# Patient Record
Sex: Male | Born: 1965 | Race: White | Hispanic: No | Smoking: Never smoker
Health system: Southern US, Community
[De-identification: ages and names within clinical notes are randomized; demographics above are authoritative.]

## PROBLEM LIST (undated history)

## (undated) DIAGNOSIS — F329 Major depressive disorder, single episode, unspecified: Secondary | ICD-10-CM

## (undated) DIAGNOSIS — E119 Type 2 diabetes mellitus without complications: Secondary | ICD-10-CM

## (undated) DIAGNOSIS — F32A Depression, unspecified: Secondary | ICD-10-CM

## (undated) DIAGNOSIS — F319 Bipolar disorder, unspecified: Secondary | ICD-10-CM

## (undated) DIAGNOSIS — H18601 Keratoconus, unspecified, right eye: Secondary | ICD-10-CM

## (undated) HISTORY — DX: Type 2 diabetes mellitus without complications: E11.9

## (undated) HISTORY — PX: EYE SURGERY: SHX253

---

## 2004-08-13 ENCOUNTER — Ambulatory Visit: Payer: Self-pay | Admitting: Family Medicine

## 2004-11-20 ENCOUNTER — Ambulatory Visit: Payer: Self-pay | Admitting: Family Medicine

## 2004-11-27 ENCOUNTER — Ambulatory Visit: Payer: Self-pay | Admitting: Family Medicine

## 2005-04-05 ENCOUNTER — Ambulatory Visit: Payer: Self-pay | Admitting: Cardiovascular Disease

## 2006-02-10 ENCOUNTER — Ambulatory Visit: Payer: Self-pay | Admitting: Family Medicine

## 2006-02-25 ENCOUNTER — Ambulatory Visit: Payer: Self-pay | Admitting: Family Medicine

## 2006-03-11 ENCOUNTER — Ambulatory Visit: Payer: Self-pay | Admitting: Family Medicine

## 2007-07-27 ENCOUNTER — Ambulatory Visit: Payer: Self-pay | Admitting: Internal Medicine

## 2008-01-12 ENCOUNTER — Ambulatory Visit: Payer: Self-pay | Admitting: Family Medicine

## 2008-01-12 DIAGNOSIS — E785 Hyperlipidemia, unspecified: Secondary | ICD-10-CM | POA: Insufficient documentation

## 2008-01-13 LAB — CONVERTED CEMR LAB
Cholesterol: 223 mg/dL (ref 0–200)
HDL: 37.7 mg/dL — ABNORMAL LOW (ref >39.0)
Total CHOL/HDL Ratio: 5.9
Triglycerides: 61 mg/dL (ref 0–149)
VLDL: 12 mg/dL (ref 0–40)

## 2008-02-02 ENCOUNTER — Ambulatory Visit: Payer: Self-pay | Admitting: Family Medicine

## 2008-03-09 ENCOUNTER — Emergency Department: Payer: Self-pay | Admitting: Emergency Medicine

## 2013-01-19 ENCOUNTER — Emergency Department: Payer: Self-pay | Admitting: Emergency Medicine

## 2013-01-20 DIAGNOSIS — H18603 Keratoconus, unspecified, bilateral: Secondary | ICD-10-CM | POA: Insufficient documentation

## 2013-07-16 DIAGNOSIS — Z947 Corneal transplant status: Secondary | ICD-10-CM | POA: Insufficient documentation

## 2014-01-26 ENCOUNTER — Observation Stay: Payer: Self-pay | Admitting: Internal Medicine

## 2014-01-26 LAB — COMPREHENSIVE METABOLIC PANEL
ALK PHOS: 45 U/L
ALT: 28 U/L (ref 12–78)
ANION GAP: 8 (ref 7–16)
AST: 34 U/L (ref 15–37)
Albumin: 4.2 g/dL (ref 3.4–5.0)
BILIRUBIN TOTAL: 1.2 mg/dL — AB (ref 0.2–1.0)
BUN: 26 mg/dL — ABNORMAL HIGH (ref 7–18)
CALCIUM: 9.2 mg/dL (ref 8.5–10.1)
Chloride: 101 mmol/L (ref 98–107)
Co2: 27 mmol/L (ref 21–32)
Creatinine: 1.36 mg/dL — ABNORMAL HIGH (ref 0.60–1.30)
EGFR (African American): >60
EGFR (Non-African Amer.): >60
GLUCOSE: 121 mg/dL — AB (ref 65–99)
Osmolality: 278 (ref 275–301)
Potassium: 3.9 mmol/L (ref 3.5–5.1)
SODIUM: 136 mmol/L (ref 136–145)
TOTAL PROTEIN: 7.4 g/dL (ref 6.4–8.2)

## 2014-01-26 LAB — CBC
HCT: 45 % (ref 40.0–52.0)
HGB: 15 g/dL (ref 13.0–18.0)
MCH: 30.7 pg (ref 26.0–34.0)
MCHC: 33.3 g/dL (ref 32.0–36.0)
MCV: 92 fL (ref 80–100)
Platelet: 205 10*3/uL (ref 150–440)
RBC: 4.88 10*6/uL (ref 4.40–5.90)
RDW: 14 % (ref 11.5–14.5)
WBC: 11.5 10*3/uL — AB (ref 3.8–10.6)

## 2014-01-26 LAB — CK TOTAL AND CKMB (NOT AT ARMC)
CK, TOTAL: 361 U/L — AB
CK-MB: 5.8 ng/mL — ABNORMAL HIGH (ref 0.5–3.6)

## 2014-01-26 LAB — TROPONIN I: Troponin-I: <0.02 ng/mL

## 2014-01-27 DIAGNOSIS — R079 Chest pain, unspecified: Secondary | ICD-10-CM

## 2014-01-27 LAB — CBC WITH DIFFERENTIAL/PLATELET
BASOS ABS: 0.1 10*3/uL (ref 0.0–0.1)
Basophil %: 1.7 %
EOS ABS: 0.1 10*3/uL (ref 0.0–0.7)
Eosinophil %: 2.2 %
HCT: 41.9 % (ref 40.0–52.0)
HGB: 13.9 g/dL (ref 13.0–18.0)
Lymphocyte #: 1 10*3/uL (ref 1.0–3.6)
Lymphocyte %: 15.9 %
MCH: 31 pg (ref 26.0–34.0)
MCHC: 33.1 g/dL (ref 32.0–36.0)
MCV: 94 fL (ref 80–100)
MONOS PCT: 10.8 %
Monocyte #: 0.6 x10 3/mm (ref 0.2–1.0)
NEUTROS PCT: 69.4 %
Neutrophil #: 4.1 10*3/uL (ref 1.4–6.5)
Platelet: 195 10*3/uL (ref 150–440)
RBC: 4.48 10*6/uL (ref 4.40–5.90)
RDW: 14.4 % (ref 11.5–14.5)
WBC: 6 10*3/uL (ref 3.8–10.6)

## 2014-01-27 LAB — CK: CK, TOTAL: 315 U/L — AB

## 2014-01-27 LAB — BASIC METABOLIC PANEL
ANION GAP: 8 (ref 7–16)
BUN: 24 mg/dL — AB (ref 7–18)
CALCIUM: 8.1 mg/dL — AB (ref 8.5–10.1)
CO2: 28 mmol/L (ref 21–32)
CREATININE: 1.15 mg/dL (ref 0.60–1.30)
Chloride: 104 mmol/L (ref 98–107)
EGFR (Non-African Amer.): >60
Glucose: 104 mg/dL — ABNORMAL HIGH (ref 65–99)
Osmolality: 284 (ref 275–301)
POTASSIUM: 3.9 mmol/L (ref 3.5–5.1)
Sodium: 140 mmol/L (ref 136–145)

## 2014-01-27 LAB — LIPID PANEL
Cholesterol: 168 mg/dL (ref 0–200)
HDL Cholesterol: 45 mg/dL (ref 40–60)
Ldl Cholesterol, Calc: 111 mg/dL — ABNORMAL HIGH (ref 0–100)
TRIGLYCERIDES: 58 mg/dL (ref 0–200)
VLDL CHOLESTEROL, CALC: 12 mg/dL (ref 5–40)

## 2014-01-27 LAB — TROPONIN I

## 2014-01-27 LAB — HEMOGLOBIN A1C: Hemoglobin A1C: 6 %

## 2014-03-30 DIAGNOSIS — R350 Frequency of micturition: Secondary | ICD-10-CM | POA: Insufficient documentation

## 2014-03-30 DIAGNOSIS — N138 Other obstructive and reflux uropathy: Secondary | ICD-10-CM | POA: Insufficient documentation

## 2014-03-30 DIAGNOSIS — R351 Nocturia: Secondary | ICD-10-CM | POA: Insufficient documentation

## 2014-03-30 DIAGNOSIS — N401 Enlarged prostate with lower urinary tract symptoms: Secondary | ICD-10-CM | POA: Insufficient documentation

## 2014-03-30 DIAGNOSIS — R3911 Hesitancy of micturition: Secondary | ICD-10-CM | POA: Insufficient documentation

## 2014-12-31 NOTE — Discharge Summary (Signed)
PATIENT NAME:  Micheal Gay, [place] A MR#:  [id number] DATE OF BIRTH:  1965-12-13  DATE OF ADMISSION:  01/26/2014 DATE OF DISCHARGE:  01/27/2014  ADMITTING PHYSICIAN: Alford Highlandichard Wieting, MD  DISCHARGING PHYSICIAN: Enid Baasadhika Monalisa Bayless, MD  PRIMARY CARE PHYSICIAN: None.  CONSULTANTS IN HOSPITAL: None.  DISCHARGE DIAGNOSIS: Chest pain secondary to costochondritis and musculoskeletal in nature.  DISCHARGE HOME MEDICATIONS:   1.  Ibuprofen 400 mg p.o. 8 hours p.r.n. for pain. 2.  Tramadol 50 mg p.o. q. 8 hours p.r.n. for pain.   DISCHARGE DIET: Regular.   DISCHARGE ACTIVITY: As tolerated.   FOLLOWUP INSTRUCTIONS: PCP follow-up in 2 to 3 weeks.   DIAGNOSTIC DATA: Prior to discharge: WBC 6, hemoglobin 13.9, hematocrit 41.9, platelet count 195,000.   Sodium 140, potassium 3.9, chloride 104, bicarb 28, BUN 24, creatinine 1.15, glucose 104, calcium 8.1.   Myocardial scan, exercise Myoview, showing perfusion study with no significant ischemia with EF of 62%. Low risk scan. No abnormality noted.   LDL cholesterol 111, HDL 45, total cholesterol 130168, triglycerides 58. Troponins remain negative.   Chest x-ray with clear lung fields. No acute cardiopulmonary disease.   BRIEF HOSPITAL COURSE: Mr. Renetta ChalkLarry Raybuck is a 49 year old young male with no significant past medical history, admitted for chest pain.   Chest pain, likely musculoskeletal in nature and costochondritis. The patient had tenderness on chest wall palpation. He also splits wood and was lifting heavy stuff and then felt like the pain started. Because he has a strong family history of his father dying from heart disease in 5830s, he was admitted. Troponins were negative. He was monitored on telemetry. He did not have any further chest pain. He had a Myoview done, which was completely normal, and the patient is being discharged home on pain medications. His course has been otherwise uneventful in the hospital.   DISCHARGE CONDITION: Stable.    DISCHARGE DISPOSITION: Home.   TIME SPENT ON DISCHARGE: 40 minutes. ____________________________ Enid Baasadhika Novi Calia, MD rk:sb D: 01/27/2014 12:57:14 ET T: 01/27/2014 14:29:56 ET JOB#: [id number]  cc: Enid Baasadhika Corderius Saraceni, MD, <Dictator> Enid BaasADHIKA Ndidi Nesby MD ELECTRONICALLY SIGNED 02/03/2014 11:51

## 2014-12-31 NOTE — H&P (Signed)
PATIENT NAME:  Micheal Gay, Micheal Gay MR#:  [id number] DATE OF BIRTH:  02-Jan-1966  DATE OF ADMISSION:  01/26/2014  PRIMARY CARE PHYSICIAN: None.   CHIEF COMPLAINT: Chest pain.   HISTORY OF PRESENT ILLNESS: This is a 49 year old man with no medical history. He presents after riding his bicycle today. He developed chest pain in the center of his chest, 9/10 in intensity. Then he also developed some sharp pain in the right upper shoulder. He was gasping for air. He felt the pain until he was given a nitro. He felt lying down and relaxing helped his pain. No nausea or vomiting. No diaphoresis. Of note, his father died at age 41 of a massive heart attack. Hospitalist services were contacted for further evaluation.   PAST MEDICAL HISTORY: None.   PAST SURGICAL HISTORY: Eye surgery, corneal transplants, left foot surgery, circumcision.   ALLERGIES: No known drug allergies.   MEDICATIONS: None.   SOCIAL HISTORY: No smoking. No alcohol. No drug use. Works at [employer].   FAMILY HISTORY: Father died at age 36 of a massive heart attack. Mother living with diabetes and breast cancer, status post surgery and radiation treatment.   REVIEW OF SYSTEMS:   CONSTITUTIONAL: Positive for fatigue. No weight loss. No weight gain. No fever, chills, or sweats.  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  EARS, NOSE, MOUTH AND THROAT: No hearing loss. No sore throat. No difficulty swallowing.  CARDIOVASCULAR: Positive for chest pain. No palpitations.  RESPIRATORY: Positive for shortness of breath. No cough. No sputum. No hemoptysis. No wheeze.  GASTROINTESTINAL: Positive for abdominal discomfort. No nausea. No vomiting. No diarrhea. No constipation. No bright red blood per rectum. No melena.  GENITOURINARY: No burning on urination. No hematuria.  MUSCULOSKELETAL: Positive for aching pains.  INTEGUMENT: No rashes or eruptions.  NEUROLOGIC: No fainting or blackouts.  PSYCHIATRIC: No anxiety or depression.   ENDOCRINE: No thyroid problems.  HEMATOLOGIC AND LYMPHATIC: No anemia.   PHYSICAL EXAMINATION: VITAL SIGNS: Temperature 98.1, pulse 80, respirations 18, blood pressure 98/57, pulse ox 98% on room air.  GENERAL: No respiratory distress.  EYES: Conjunctivae and lids normal. Pupils equal, round, reactive to light. Extraocular muscles intact. No nystagmus.  EARS, NOSE, MOUTH AND THROAT: Tympanic membranes: No erythema. Nasal mucosa: No erythema. Throat: No erythema. No exudate seen. Lips and gums: No lesions.  NECK: No JVD. No bruits. No lymphadenopathy. No thyromegaly. No thyroid nodules palpated.  RESPIRATORY:  Lungs clear to auscultation. No use of accessory muscles to breathe. No rhonchi, rales, or wheeze heard.  CARDIOVASCULAR SYSTEM: S1, S2 normal. No gallops, rubs, or murmurs heard. Carotid upstroke 2+ bilaterally. No bruits. Dorsalis pedis pulses 2+ bilaterally. No edema of the lower extremities. All pulses equal throughout upper and lower extremities.  ABDOMEN: Soft. Slight tenderness throughout the entire abdomen also. No organomegaly/ splenomegaly. Normoactive bowel sounds. No masses felt.  CHEST WALL: Positive pain to palpation over the chest wall.  LYMPHATIC: No lymph nodes in the neck.  MUSCULOSKELETAL: No clubbing, edema, or cyanosis.  SKIN: No rashes or ulcers seen.  NEUROLOGIC: Cranial nerves II through XII grossly intact. Deep tendon reflexes 2+ bilateral lower extremities.  PSYCHIATRIC: The patient is oriented to person, place, and time.   LABORATORY AND RADIOLOGICAL DATA:  EKG: Normal sinus rhythm, 94 beats per minute, no acute ST-T wave changes. Chest x-ray: Mild hypoinflation, no evidence of cardiopulmonary disease. White blood cell count 11.5, H and H 15.0 and 45.0, platelet count of 205. CPK 361, glucose 121,  BUN 26, creatinine 1.36, sodium 136, potassium 3.9, chloride 101, CO2 of 27, calcium 9.2. Total bilirubin 1.2, alkaline phosphatase 45, ALT 28, AST 34, total protein  7.4, albumin 4.2. Troponin negative.   ASSESSMENT AND PLAN: 1.  Atypical chest pain. Could be musculoskeletal in nature. With the patient's family history and exertional chest pain, ER physician asked me to observe him overnight. I will order serial cardiac enzymes and get a stress test in the morning.   2.  Dehydration. The ER physician gave a liter of fluid wide open. I will give another liter.   3.  Impaired fasting glucose. Will watch sugars while he is here, and check a hemoglobin A1c in the morning.   Of note, the patient did not want to stay in the hospital, was thinking about signing out of the hospital. The ER physician convinced him to stay.   Time spent on admission: 50 minutes.    ____________________________ Herschell Dimesichard J. Renae GlossWieting, MD rjw:mr D: 01/26/2014 19:28:05 ET T: 01/26/2014 19:54:17 ET JOB#: [id number]  cc: Herschell Dimesichard J. Renae GlossWieting, MD, <Dictator> Salley ScarletICHARD J Saraiya Kozma MD ELECTRONICALLY SIGNED 01/27/2014 19:38

## 2015-04-27 DIAGNOSIS — K219 Gastro-esophageal reflux disease without esophagitis: Secondary | ICD-10-CM | POA: Insufficient documentation

## 2015-04-27 DIAGNOSIS — I10 Essential (primary) hypertension: Secondary | ICD-10-CM | POA: Insufficient documentation

## 2015-10-29 ENCOUNTER — Emergency Department
Admission: EM | Admit: 2015-10-29 | Discharge: 2015-10-29 | Disposition: A | Discharge status: Home / Self Care | Payer: BLUE CROSS/BLUE SHIELD | Attending: Emergency Medicine | Admitting: Emergency Medicine

## 2015-10-29 ENCOUNTER — Encounter: Payer: Self-pay | Admitting: Emergency Medicine

## 2015-10-29 DIAGNOSIS — H109 Unspecified conjunctivitis: Secondary | ICD-10-CM | POA: Diagnosis not present

## 2015-10-29 DIAGNOSIS — H168 Other keratitis: Secondary | ICD-10-CM

## 2015-10-29 DIAGNOSIS — H169 Unspecified keratitis: Secondary | ICD-10-CM | POA: Insufficient documentation

## 2015-10-29 DIAGNOSIS — H5711 Ocular pain, right eye: Secondary | ICD-10-CM | POA: Diagnosis present

## 2015-10-29 HISTORY — DX: Keratoconus, unspecified, right eye: H18.601

## 2015-10-29 HISTORY — DX: Major depressive disorder, single episode, unspecified: F32.9

## 2015-10-29 HISTORY — DX: Depression, unspecified: F32.A

## 2015-10-29 MED ORDER — TETRACAINE HCL 0.5 % OP SOLN
OPHTHALMIC | Status: AC
  Administered 2015-10-29: 1 [drp] via OPHTHALMIC
  Filled 2015-10-29: qty 2

## 2015-10-29 MED ORDER — FLUORESCEIN SODIUM 1 MG OP STRP
ORAL_STRIP | OPHTHALMIC | Status: AC
  Administered 2015-10-29: 1 via OPHTHALMIC
  Filled 2015-10-29: qty 1

## 2015-10-29 MED ORDER — FLUORESCEIN SODIUM 1 MG OP STRP
1.0000 | ORAL_STRIP | Freq: Once | OPHTHALMIC | Status: AC
  Administered 2015-10-29: 1 via OPHTHALMIC

## 2015-10-29 MED ORDER — TETRACAINE HCL 0.5 % OP SOLN
1.0000 [drp] | Freq: Once | OPHTHALMIC | Status: AC
  Administered 2015-10-29: 1 [drp] via OPHTHALMIC

## 2015-10-29 MED ORDER — IBUPROFEN 800 MG PO TABS
800.0000 mg | ORAL_TABLET | Freq: Once | ORAL | Status: AC
  Administered 2015-10-29: 800 mg via ORAL
  Filled 2015-10-29: qty 1

## 2015-10-29 MED ORDER — ONDANSETRON HCL 4 MG PO TABS: 4.0000 mg | ORAL_TABLET | Freq: Every day | ORAL | Status: DC | PRN

## 2015-10-29 MED ORDER — CIPROFLOXACIN HCL 0.3 % OP SOLN: 1.0000 [drp] | OPHTHALMIC | Status: AC

## 2015-10-29 MED ORDER — ONDANSETRON 4 MG PO TBDP
4.0000 mg | ORAL_TABLET | Freq: Once | ORAL | Status: AC
  Administered 2015-10-29: 4 mg via ORAL
  Filled 2015-10-29: qty 1

## 2015-10-29 MED ORDER — OXYCODONE-ACETAMINOPHEN 5-325 MG PO TABS: 1.0000 | ORAL_TABLET | Freq: Four times a day (QID) | ORAL | Status: DC | PRN

## 2015-10-29 MED ORDER — CIPROFLOXACIN HCL 0.3 % OP SOLN
2.0000 [drp] | OPHTHALMIC | Status: DC
  Administered 2015-10-29: 2 [drp] via OPHTHALMIC
  Filled 2015-10-29: qty 2.5

## 2015-10-29 NOTE — ED Notes (Signed)
Pt states a few days ago he had sudden onset burning in his R eye. Pt states he thinks he lost his R contact in his eye and has not been able to get it out. Pt presents with redness and drainage noted to R eye. Pt states he is having blurrier vision than normal at this time in his R eye. Pt states he has a hard time seeing without his contacts, did not put in L eye this morning.

## 2015-10-29 NOTE — ED Provider Notes (Signed)
CSN: [id number]     Arrival date & time 10/29/15  0911 History   First MD Initiated Contact with Patient 10/29/15 1107     Chief Complaint  Patient presents with  . Eye Pain     (Consider location/radiation/quality/duration/timing/severity/associated sxs/prior Treatment) HPI  50 year old male presents to emergency department for evaluation of right eye pain. I pain began 2-3 days ago but this morning became severe 10 out of 10. Patient states he left his contact in, has been unable to get it out. He has had redness throughout the right eye with a foreign body sensation and photophobia. He has not taken any medications for pain. His vision is blurred. Denies any rashes.   Past Medical History  Diagnosis Date  . Keratoconus of right eye   . Depression    Past Surgical History  Procedure Laterality Date  . Eye surgery     History reviewed. No pertinent family history. Social History  Substance Use Topics  . Smoking status: Never Smoker   . Smokeless tobacco: Former Neurosurgeon  . Alcohol Use: No    Review of Systems  Constitutional: Negative.  Negative for fever, chills, activity change and appetite change.  HENT: Negative for congestion, ear pain, mouth sores, rhinorrhea, sinus pressure, sore throat and trouble swallowing.   Eyes: Positive for photophobia, pain, discharge, redness and visual disturbance.  Respiratory: Negative for cough, chest tightness and shortness of breath.   Cardiovascular: Negative for chest pain and leg swelling.  Gastrointestinal: Negative for nausea, vomiting, abdominal pain, diarrhea and abdominal distention.  Genitourinary: Negative for dysuria and difficulty urinating.  Musculoskeletal: Negative for back pain, arthralgias and gait problem.  Skin: Negative for color change and rash.  Neurological: Negative for dizziness and headaches.  Hematological: Negative for adenopathy.  Psychiatric/Behavioral: Negative for behavioral problems and agitation.       Allergies  Other and Pollen extract  Home Medications   Prior to Admission medications   Medication Sig Start Date End Date Taking? Authorizing Provider  ciprofloxacin (CILOXAN) 0.3 % ophthalmic solution Place 1 drop into both eyes every 2 (two) hours. Administer 1 drop, every 2 hours, while awake, for 2 days. Then 1 drop, every 4 hours, while awake, for the next 5 days. 10/29/15 11/03/15  Evon Slack, PA-C  ondansetron (ZOFRAN) 4 MG tablet Take 1 tablet (4 mg total) by mouth daily as needed for nausea or vomiting. 10/29/15 10/28/16  Evon Slack, PA-C  oxyCODONE-acetaminophen (ROXICET) 5-325 MG tablet Take 1 tablet by mouth every 6 (six) hours as needed. 10/29/15 10/28/16  Evon Slack, PA-C   BP 136/77 mmHg  Pulse 72  Temp(Src) 98.2 F (36.8 C) (Oral)  Resp 18  Ht 5' 11.5" (1.816 m)  Wt 99.791 kg  BMI 30.26 kg/m2  SpO2 98% Physical Exam  Constitutional: He is oriented to person, place, and time. He appears well-developed and well-nourished.  HENT:  Head: Normocephalic and atraumatic.  Eyes: EOM are normal. Pupils are equal, round, and reactive to light. Lids are everted and swept, no foreign bodies found. Right eye exhibits discharge. Right eye exhibits no exudate and no hordeolum. No foreign body present in the right eye. Right conjunctiva is injected. Left conjunctiva is not injected. Right eye exhibits normal extraocular motion. Left eye exhibits normal extraocular motion.  Slit lamp exam:      The right eye shows fluorescein uptake (2-3 mm).  Right contact lens was removed by using a suction.  Neck: Normal range of motion. Neck  supple.  Cardiovascular: Normal rate and intact distal pulses.   Pulmonary/Chest: Effort normal and breath sounds normal. No respiratory distress.  Musculoskeletal: Normal range of motion. He exhibits no edema or tenderness.  Neurological: He is alert and oriented to person, place, and time.  Skin: Skin is warm and dry. No rash noted.   Psychiatric: He has a normal mood and affect. His behavior is normal. Judgment and thought content normal.    ED Course  Procedures (including critical care time) Contact lens removed by using a suction. Tetracaine and forcing stain applied and showing corneal opacity 2-3 mm in size.  Labs Review Labs Reviewed - No data to display  Imaging Review No results found. I have personally reviewed and evaluated these images and lab results as part of my medical decision-making.   EKG Interpretation None      MDM   Final diagnoses:  Bacterial keratitis  Conjunctivitis of right eye    50 year old male with right eye erythema, drainage, photophobia after retained contact lens for 3 days. Context lens was removed and forcing stain showed a small opacity. Patient thought to have keratitis. Ophthalmology was consult could and recommended he be placed on topical antibiotics and given follow-up tomorrow morning. Patient understands and needs to see the ophthalmologist first thing in the morning for recheck. He will use antibiotic eyedrops every 2 hours until seen. He is given a prescription for pain medications. Return to the ER for any worsening symptoms urgent changes in his health.    Evon Slack, PA-C 10/29/15 1233  Jennye Moccasin, MD 10/29/15 [phone]

## 2015-10-29 NOTE — Discharge Instructions (Signed)
How to Use Eye Drops and Eye Ointments HOW TO APPLY EYE DROPS Follow these steps when applying eye drops: 1. Wash your hands. 2. Tilt your head back. 3. Put a finger under your eye and use it to gently pull your lower lid downward. Keep that finger in place. 4. Using your other hand, hold the dropper between your thumb and index finger. 5. Position the dropper just over the edge of the lower lid. Hold it as close to your eye as you can without touching the dropper to your eye. 6. Steady your hand. One way to do this is to lean your index finger against your brow. 7. Look up. 8. Slowly and gently squeeze one drop of medicine into your eye. 9. Close your eye. 10. Place a finger between your lower eyelid and your nose. Press gently for 2 minutes. This increases the amount of time that the medicine is exposed to the eye. It also reduces side effects that can develop if the drop gets into the bloodstream through the nose. HOW TO APPLY EYE OINTMENTS Follow these steps when applying eye ointments: 1. Wash your hands. 2. Put a finger under your eye and use it to gently pull your lower lid downward. Keep that finger in place. 3. Using your other hand, place the tip of the tube between your thumb and index finger with the remaining fingers braced against your cheek or nose. 4. Hold the tube just over the edge of your lower lid without touching the tube to your lid or eyeball. 5. Look up. 6. Line the inner part of your lower lid with ointment. 7. Gently pull up on your upper lid and look down. This will force the ointment to spread over the surface of the eye. 8. Release the upper lid. 9. If you can, close your eyes for 1-2 minutes. Do not rub your eyes. If you applied the ointment correctly, your vision will be blurry for a few minutes. This is normal. ADDITIONAL INFORMATION  Make sure to use the eye drops or ointment as told by your health care provider.  If you have been told to use both eye  drops and an eye ointment, apply the eye drops first, then wait 3-4 minutes before you apply the ointment.  Try not to touch the tip of the dropper or tube to your eye. A dropper or tube that has touched the eye can become contaminated.   This information is not intended to replace advice given to you by your health care provider. Make sure you discuss any questions you have with your health care provider.   Document Released: 12/02/2000 Document Revised: 01/10/2015 Document Reviewed: 08/22/2014 Elsevier Interactive Patient Education 2016 [employer]   Please follow-up with Dr.Porfillio office tomorrow morning. His address and office telephone number is located on the discharge instructions. Please tell the office that you were seen in the emergency department today and that Dr. Melanie Crazier asked for you to come in today for repeat eye exam. Please apply eye drops to the right eye every 2 hours.

## 2016-09-22 ENCOUNTER — Inpatient Hospital Stay
Admission: EM | Admit: 2016-09-22 | Discharge: 2016-09-26 | DRG: 872 | Disposition: A | Discharge status: Home / Self Care | Payer: BLUE CROSS/BLUE SHIELD | Attending: Internal Medicine | Admitting: Internal Medicine

## 2016-09-22 ENCOUNTER — Encounter: Payer: Self-pay | Admitting: Emergency Medicine

## 2016-09-22 DIAGNOSIS — R4182 Altered mental status, unspecified: Secondary | ICD-10-CM | POA: Diagnosis present

## 2016-09-22 DIAGNOSIS — F32A Depression, unspecified: Secondary | ICD-10-CM | POA: Diagnosis present

## 2016-09-22 DIAGNOSIS — F319 Bipolar disorder, unspecified: Secondary | ICD-10-CM | POA: Diagnosis present

## 2016-09-22 DIAGNOSIS — N3000 Acute cystitis without hematuria: Secondary | ICD-10-CM | POA: Diagnosis present

## 2016-09-22 DIAGNOSIS — N390 Urinary tract infection, site not specified: Secondary | ICD-10-CM | POA: Diagnosis present

## 2016-09-22 DIAGNOSIS — Z87891 Personal history of nicotine dependence: Secondary | ICD-10-CM | POA: Diagnosis not present

## 2016-09-22 DIAGNOSIS — R51 Headache: Secondary | ICD-10-CM | POA: Diagnosis not present

## 2016-09-22 DIAGNOSIS — R509 Fever, unspecified: Secondary | ICD-10-CM

## 2016-09-22 DIAGNOSIS — Z79899 Other long term (current) drug therapy: Secondary | ICD-10-CM | POA: Diagnosis not present

## 2016-09-22 DIAGNOSIS — A419 Sepsis, unspecified organism: Principal | ICD-10-CM | POA: Diagnosis present

## 2016-09-22 DIAGNOSIS — N419 Inflammatory disease of prostate, unspecified: Secondary | ICD-10-CM | POA: Diagnosis present

## 2016-09-22 DIAGNOSIS — F329 Major depressive disorder, single episode, unspecified: Secondary | ICD-10-CM | POA: Diagnosis present

## 2016-09-22 DIAGNOSIS — K5900 Constipation, unspecified: Secondary | ICD-10-CM | POA: Diagnosis not present

## 2016-09-22 HISTORY — DX: Sepsis, unspecified organism: A41.9

## 2016-09-22 HISTORY — DX: Bipolar disorder, unspecified: F31.9

## 2016-09-22 LAB — COMPREHENSIVE METABOLIC PANEL
ALK PHOS: 52 U/L (ref 38–126)
ALT: 39 U/L (ref 17–63)
ANION GAP: 6 (ref 5–15)
AST: 46 U/L — ABNORMAL HIGH (ref 15–41)
Albumin: 4.1 g/dL (ref 3.5–5.0)
BILIRUBIN TOTAL: 0.9 mg/dL (ref 0.3–1.2)
BUN: 16 mg/dL (ref 6–20)
CALCIUM: 8.5 mg/dL — AB (ref 8.9–10.3)
CO2: 30 mmol/L (ref 22–32)
Chloride: 97 mmol/L — ABNORMAL LOW (ref 101–111)
Creatinine, Ser: 0.98 mg/dL (ref 0.61–1.24)
Glucose, Bld: 167 mg/dL — ABNORMAL HIGH (ref 65–99)
POTASSIUM: 3.7 mmol/L (ref 3.5–5.1)
Sodium: 133 mmol/L — ABNORMAL LOW (ref 135–145)
Total Protein: 7.1 g/dL (ref 6.5–8.1)

## 2016-09-22 LAB — URINALYSIS, ROUTINE W REFLEX MICROSCOPIC
BACTERIA UA: NONE SEEN
BILIRUBIN URINE: NEGATIVE
Glucose, UA: NEGATIVE mg/dL
KETONES UR: NEGATIVE mg/dL
NITRITE: NEGATIVE
PH: 5 (ref 5.0–8.0)
PROTEIN: 30 mg/dL — AB
Specific Gravity, Urine: 1.011 (ref 1.005–1.030)
Squamous Epithelial / LPF: NONE SEEN

## 2016-09-22 LAB — CBC WITH DIFFERENTIAL/PLATELET
BASOS PCT: 0 %
Basophils Absolute: 0 10*3/uL (ref 0–0.1)
Eosinophils Absolute: 0 10*3/uL (ref 0–0.7)
Eosinophils Relative: 0 %
HEMATOCRIT: 41 % (ref 40.0–52.0)
HEMOGLOBIN: 13.9 g/dL (ref 13.0–18.0)
LYMPHS PCT: 4 %
Lymphs Abs: 0.6 10*3/uL — ABNORMAL LOW (ref 1.0–3.6)
MCH: 31.1 pg (ref 26.0–34.0)
MCHC: 34 g/dL (ref 32.0–36.0)
MCV: 91.6 fL (ref 80.0–100.0)
MONO ABS: 1.4 10*3/uL — AB (ref 0.2–1.0)
Monocytes Relative: 10 %
NEUTROS ABS: 12.8 10*3/uL — AB (ref 1.4–6.5)
NEUTROS PCT: 86 %
Platelets: 179 10*3/uL (ref 150–440)
RBC: 4.47 MIL/uL (ref 4.40–5.90)
RDW: 13.7 % (ref 11.5–14.5)
WBC: 14.9 10*3/uL — AB (ref 3.8–10.6)

## 2016-09-22 LAB — INFLUENZA PANEL BY PCR (TYPE A & B)
INFLAPCR: NEGATIVE
Influenza B By PCR: NEGATIVE

## 2016-09-22 LAB — LACTIC ACID, PLASMA: Lactic Acid, Venous: 1.5 mmol/L (ref 0.5–1.9)

## 2016-09-22 MED ORDER — ACETAMINOPHEN 500 MG PO TABS
ORAL_TABLET | ORAL | Status: AC
  Administered 2016-09-22: 1000 mg via ORAL
  Filled 2016-09-22: qty 2

## 2016-09-22 MED ORDER — ESCITALOPRAM OXALATE 10 MG PO TABS
10.0000 mg | ORAL_TABLET | Freq: Every day | ORAL | Status: DC
  Administered 2016-09-23: 10 mg via ORAL
  Filled 2016-09-22 (×2): qty 1

## 2016-09-22 MED ORDER — DEXTROSE 5 % IV SOLN
1.0000 g | Freq: Once | INTRAVENOUS | Status: DC
  Filled 2016-09-22: qty 1

## 2016-09-22 MED ORDER — CEFEPIME-DEXTROSE 1 GM/50ML IV SOLR
1.0000 g | Freq: Once | INTRAVENOUS | Status: AC
  Administered 2016-09-22: 1 g via INTRAVENOUS
  Filled 2016-09-22: qty 50

## 2016-09-22 MED ORDER — SODIUM CHLORIDE 0.9 % IV BOLUS (SEPSIS)
1000.0000 mL | Freq: Once | INTRAVENOUS | Status: AC
  Administered 2016-09-22: 1000 mL via INTRAVENOUS

## 2016-09-22 MED ORDER — RISPERIDONE 0.5 MG PO TABS
0.5000 mg | ORAL_TABLET | Freq: Every day | ORAL | Status: DC
  Filled 2016-09-22: qty 1

## 2016-09-22 MED ORDER — ACETAMINOPHEN 500 MG PO TABS
1000.0000 mg | ORAL_TABLET | Freq: Once | ORAL | Status: AC
Start: 2016-09-22 — End: 2016-09-22
  Administered 2016-09-22: 1000 mg via ORAL

## 2016-09-22 MED ORDER — OXYCODONE-ACETAMINOPHEN 5-325 MG PO TABS
1.0000 | ORAL_TABLET | Freq: Four times a day (QID) | ORAL | Status: DC | PRN
  Administered 2016-09-23 – 2016-09-25 (×5): 1 via ORAL
  Filled 2016-09-22 (×5): qty 1

## 2016-09-22 MED ORDER — DEXTROSE 5 % IV SOLN
2.0000 g | Freq: Three times a day (TID) | INTRAVENOUS | Status: DC
  Filled 2016-09-22 (×2): qty 2

## 2016-09-22 MED ORDER — VANCOMYCIN HCL 10 G IV SOLR: 1250.0000 mg | Freq: Once | INTRAVENOUS | Status: AC

## 2016-09-22 MED ORDER — ONDANSETRON HCL 4 MG/2ML IJ SOLN: 4.0000 mg | Freq: Four times a day (QID) | INTRAMUSCULAR | Status: DC | PRN

## 2016-09-22 MED ORDER — VANCOMYCIN HCL 10 G IV SOLR
1250.0000 mg | Freq: Three times a day (TID) | INTRAVENOUS | Status: DC
  Administered 2016-09-23: 1250 mg via INTRAVENOUS
  Filled 2016-09-22 (×3): qty 1250

## 2016-09-22 MED ORDER — MORPHINE SULFATE (PF) 4 MG/ML IV SOLN
4.0000 mg | Freq: Once | INTRAVENOUS | Status: AC
  Administered 2016-09-22: 4 mg via INTRAVENOUS
  Filled 2016-09-22: qty 1

## 2016-09-22 MED ORDER — CEFEPIME-DEXTROSE 2 GM/50ML IV SOLR
2.0000 g | Freq: Two times a day (BID) | INTRAVENOUS | Status: DC
  Filled 2016-09-22: qty 50

## 2016-09-22 MED ORDER — ACETAMINOPHEN 650 MG RE SUPP: 650.0000 mg | Freq: Four times a day (QID) | RECTAL | Status: DC | PRN

## 2016-09-22 MED ORDER — CEFEPIME-DEXTROSE 2 GM/50ML IV SOLR
2.0000 g | Freq: Three times a day (TID) | INTRAVENOUS | Status: DC
  Administered 2016-09-23: 2 g via INTRAVENOUS
  Filled 2016-09-22 (×3): qty 50

## 2016-09-22 MED ORDER — ENOXAPARIN SODIUM 40 MG/0.4ML ~~LOC~~ SOLN
40.0000 mg | SUBCUTANEOUS | Status: DC
  Administered 2016-09-23 – 2016-09-25 (×4): 40 mg via SUBCUTANEOUS
  Filled 2016-09-22 (×4): qty 0.4

## 2016-09-22 MED ORDER — ONDANSETRON HCL 4 MG PO TABS: 4.0000 mg | ORAL_TABLET | Freq: Four times a day (QID) | ORAL | Status: DC | PRN

## 2016-09-22 MED ORDER — ACETAMINOPHEN 325 MG PO TABS
650.0000 mg | ORAL_TABLET | Freq: Four times a day (QID) | ORAL | Status: DC | PRN
  Administered 2016-09-23 – 2016-09-25 (×5): 650 mg via ORAL
  Filled 2016-09-22 (×5): qty 2

## 2016-09-22 MED ORDER — ONDANSETRON HCL 4 MG/2ML IJ SOLN
4.0000 mg | Freq: Once | INTRAMUSCULAR | Status: AC
  Administered 2016-09-22: 4 mg via INTRAVENOUS
  Filled 2016-09-22: qty 2

## 2016-09-22 NOTE — ED Triage Notes (Addendum)
Pt reports fever, body aches and dysuria; temp at home 102; currently 101.5-has not taken any tylenol or ibuprofen since this am; pt adds he's been very dizzy, feeling weak and confused; feels like he's having difficulty following directions;

## 2016-09-22 NOTE — H&P (Signed)
[facility] [place] [facility]   PATIENT NAME: Micheal ChalkLarry Gay    MR#:  [id number]  DATE OF BIRTH:  05-Sep-1966  DATE OF ADMISSION:  09/22/2016  PRIMARY CARE PHYSICIAN: Luna FuseEJAN-SIE, SHEIKH AHMED, MD   REQUESTING/REFERRING PHYSICIAN: Lenard LancePaduchowski, MD  CHIEF COMPLAINT:   Chief Complaint  Patient presents with  . Dysuria  . Generalized Body Aches  . Dizziness  . Altered Mental Status    HISTORY OF PRESENT ILLNESS:  Micheal Gay  is a 51 y.o. male who presents with An day of nausea, body aches, dysuria, and a fall. Patient states for some time is been having some urinary hesitancy and urinary frequency, as well as a sensation of incomplete voiding. He is also had some ejaculatory issues. Today in the ED was found to meet sepsis criteria and likely has UTI. Hospitalists were called for admission  PAST MEDICAL HISTORY:   Past Medical History:  Diagnosis Date  . Bipolar 1 disorder (HCC)   . Depression   . Keratoconus of right eye     PAST SURGICAL HISTORY:   Past Surgical History:  Procedure Laterality Date  . EYE SURGERY      SOCIAL HISTORY:   Social History  Substance Use Topics  . Smoking status: Never Smoker  . Smokeless tobacco: Former NeurosurgeonUser  . Alcohol use No    FAMILY HISTORY:   Family History  Problem Relation Age of Onset  . Diabetes Mother   . Breast cancer Mother   . Heart attack Father     DRUG ALLERGIES:   Allergies  Allergen Reactions  . Other     Animal Dandruff Hay    . Pollen Extract     MEDICATIONS AT HOME:   Prior to Admission medications   Medication Sig Start Date End Date Taking? Authorizing Provider  escitalopram (LEXAPRO) 10 MG tablet Take 10 mg by mouth daily. 08/26/16   Historical Provider, MD  ondansetron (ZOFRAN) 4 MG tablet Take 1 tablet (4 mg total) by mouth daily as needed for nausea or vomiting. 10/29/15 10/28/16  Evon Slackhomas C Gaines, PA-C  oxyCODONE-acetaminophen (ROXICET) 5-325 MG tablet Take 1  tablet by mouth every 6 (six) hours as needed. 10/29/15 10/28/16  Evon Slackhomas C Gaines, PA-C  risperiDONE (RISPERDAL) 0.5 MG tablet Take 0.5 mg by mouth daily. 09/17/16   Historical Provider, MD    REVIEW OF SYSTEMS:  Review of Systems  Constitutional: Positive for fever and malaise/fatigue. Negative for chills and weight loss.  HENT: Negative for ear pain, hearing loss and tinnitus.   Eyes: Negative for blurred vision, double vision, pain and redness.  Respiratory: Negative for cough, hemoptysis and shortness of breath.   Cardiovascular: Negative for chest pain, palpitations, orthopnea and leg swelling.  Gastrointestinal: Positive for nausea. Negative for abdominal pain, constipation, diarrhea and vomiting.  Genitourinary: Positive for dysuria and frequency. Negative for hematuria.  Musculoskeletal: Negative for back pain, joint pain and neck pain.  Skin:       No acne, rash, or lesions  Neurological: Negative for dizziness, tremors, focal weakness and weakness.  Endo/Heme/Allergies: Negative for polydipsia. Does not bruise/bleed easily.  Psychiatric/Behavioral: Negative for depression. The patient is not nervous/anxious and does not have insomnia.      VITAL SIGNS:   Vitals:   09/22/16 1927 09/22/16 2000 09/22/16 2030 09/22/16 2100  BP:  100/78 111/61 109/63  Pulse: 94 92 92 92  Resp: 20 (!) 21 18 20   Temp:      TempSrc:  SpO2: 98% 97% 96% 94%  Weight:      Height:       Wt Readings from Last 3 Encounters:  09/22/16 87.1 kg (192 lb)  10/29/15 99.8 kg (220 lb)  02/02/08 (!) 108 kg (238 lb)    PHYSICAL EXAMINATION:  Physical Exam  Vitals reviewed. Constitutional: He is oriented to person, place, and time. He appears well-developed and well-nourished. No distress.  HENT:  Head: Normocephalic and atraumatic.  Mouth/Throat: Oropharynx is clear and moist.  Eyes: Conjunctivae and EOM are normal. Pupils are equal, round, and reactive to light. No scleral icterus.  Neck: Normal  range of motion. Neck supple. No JVD present. No thyromegaly present.  Cardiovascular: Normal rate, regular rhythm and intact distal pulses.  Exam reveals no gallop and no friction rub.   No murmur heard. Respiratory: Effort normal and breath sounds normal. No respiratory distress. He has no wheezes. He has no rales.  GI: Soft. Bowel sounds are normal. He exhibits no distension. There is no tenderness.  Musculoskeletal: Normal range of motion. He exhibits no edema.  No arthritis, no gout  Lymphadenopathy:    He has no cervical adenopathy.  Neurological: He is alert and oriented to person, place, and time. No cranial nerve deficit.  No dysarthria, no aphasia  Skin: Skin is warm and dry. No rash noted. No erythema.  Psychiatric: He has a normal mood and affect. His behavior is normal. Judgment and thought content normal.    LABORATORY PANEL:   CBC  Recent Labs Lab 09/22/16 1929  WBC 14.9*  HGB 13.9  HCT 41.0  PLT 179   ------------------------------------------------------------------------------------------------------------------  Chemistries   Recent Labs Lab 09/22/16 1929  NA 133*  K 3.7  CL 97*  CO2 30  GLUCOSE 167*  BUN 16  CREATININE 0.98  CALCIUM 8.5*  AST 46*  ALT 39  ALKPHOS 52  BILITOT 0.9   ------------------------------------------------------------------------------------------------------------------  Cardiac Enzymes No results for input(s): TROPONINI in the last 168 hours. ------------------------------------------------------------------------------------------------------------------  RADIOLOGY:  No results found.  EKG:   Orders placed or performed during the hospital encounter of 09/22/16  . ED EKG 12-Lead  . ED EKG 12-Lead  . EKG 12-Lead  . EKG 12-Lead    IMPRESSION AND PLAN:  Principal Problem:   Sepsis (HCC) - IV antibiotics started in the ED and continued on admission, lactic acid was normal, hemodynamically stable, culture sent  from the ED Active Problems:   UTI (urinary tract infection) - causes sepsis, antibiotics and cultures as above, suspect patient may have some underlying prostate issue and likely needs a urology consult on discharge.   Depression - continue home meds   Bipolar 1 disorder (HCC) - continue home meds  All the records are reviewed and case discussed with ED provider. Management plans discussed with the patient and/or family.  DVT PROPHYLAXIS: SubQ lovenox  GI PROPHYLAXIS: None  ADMISSION STATUS: Inpatient  CODE STATUS: Full Code Status History    This patient does not have a recorded code status. Please follow your organizational policy for patients in this situation.      TOTAL TIME TAKING CARE OF THIS PATIENT: 45 minutes.    Decarlo Rivet FIELDING 09/22/2016, 9:12 PM  [facility]  Office  [phone]  CC: Primary care physician; Luna Fuse, MD

## 2016-09-22 NOTE — Progress Notes (Signed)
Pharmacy Antibiotic Note  Micheal SorrowLarry A Gay is a 51 y.o. male admitted on 09/22/2016 with  sepsis and UTI.  Pharmacy has been consulted for Cefepime dosing. Patient received Cefepime 1gm IV in ED x 1 dose.   Plan: Will start patient on Cefepime 2gm IV every 12 hours.  Height: 5' 11.5" (181.6 cm) Weight: 192 lb (87.1 kg) IBW/kg (Calculated) : 76.45  Temp (24hrs), Avg:101.5 F (38.6 C), Min:101.5 F (38.6 C), Max:101.5 F (38.6 C)   Recent Labs Lab 09/22/16 1929  WBC 14.9*  CREATININE 0.98  LATICACIDVEN 1.5    Estimated Creatinine Clearance: 98.7 mL/min (by C-G formula based on SCr of 0.98 mg/dL).    Allergies  Allergen Reactions  . Other     Animal Dandruff Hay    . Pollen Extract     Antimicrobials this admission: 0114 Cefepime >>   Dose adjustments this admission:   Microbiology results: 0114 BCx: sent 0114 UCx: sent   Thank you for allowing pharmacy to be a part of this patient's care.  Gardner CandleSheema M Wyatt Thorstenson, PharmD, BCPS Clinical Pharmacist 09/22/2016 9:42 PM

## 2016-09-22 NOTE — ED Provider Notes (Signed)
[facility] Emergency Department Provider Note  Time seen: 7:35 PM  I have reviewed the triage vital signs and the nursing notes.   HISTORY  Chief Complaint Dysuria; Generalized Body Aches; Dizziness; and Altered Mental Status    HPI Micheal Gay is a 51 y.o. male With a past medical history of bipolar presents the emergenc, nausea, fever to 101, body aches and generalized weakness. According to the patient for the past 2 days he has been febrile with fever and chills at home. States pain with urination and pain in the lower abdomen.Denies any history of UTIs in the past. Denies any cough congestion or sore throat.negative for diarrhea.  Past Medical History:  Diagnosis Date  . Bipolar 1 disorder (HCC)   . Depression   . Keratoconus of right eye     Patient Active Problem List   Diagnosis Date Noted  . HYPERLIPIDEMIA 01/12/2008    Past Surgical History:  Procedure Laterality Date  . EYE SURGERY      Prior to Admission medications   Medication Sig Start Date End Date Taking? Authorizing Provider  escitalopram (LEXAPRO) 10 MG tablet Take 10 mg by mouth daily. 08/26/16   Historical Provider, MD  ondansetron (ZOFRAN) 4 MG tablet Take 1 tablet (4 mg total) by mouth daily as needed for nausea or vomiting. 10/29/15 10/28/16  Evon Slackhomas C Gaines, PA-C  oxyCODONE-acetaminophen (ROXICET) 5-325 MG tablet Take 1 tablet by mouth every 6 (six) hours as needed. 10/29/15 10/28/16  Evon Slackhomas C Gaines, PA-C  risperiDONE (RISPERDAL) 0.5 MG tablet Take 0.5 mg by mouth daily. 09/17/16   Historical Provider, MD    Allergies  Allergen Reactions  . Other     Animal Dandruff Hay    . Pollen Extract     History reviewed. No pertinent family history.  Social History Social History  Substance Use Topics  . Smoking status: Never Smoker  . Smokeless tobacco: Former NeurosurgeonUser  . Alcohol use No    Review of Systems Constitutional: Negative for fever. Cardiovascular: Negative  for chest pain. Respiratory: Negative for shortness of breath. Gastrointestinal: positive for 9/10 dull suprapubic abdominal pain. Positive for nausea but negative for vomiting. Negat Genitourinary: moderate dysuria. Negative for hematuria. Musculoskeletal: Negative for back pain. Neurological: Negative for headache 10-point ROS otherwise negative.  ____________________________________________   PHYSICAL EXAM:  VITAL SIGNS: ED Triage Vitals [09/22/16 1914]  Enc Vitals Group     BP 136/65     Pulse Rate 96     Resp 18     Temp (!) 101.5 F (38.6 C)     Temp Source Oral     SpO2 100 %     Weight 192 lb (87.1 kg)     Height 5' 11.5" (1.816 m)     Head Circumference      Peak Flow      Pain Score 9     Pain Loc      Pain Edu?      Excl. in GC?     Constitutional: Alert and oriented. Well appearing and in no distress. Eyes: Normal exam ENT   Head: Normocephalic and atraumatic.   Mouth/Throat: Mucous membranes are moist.no pharyngeal erythema. Cardiovascular:egular rhythm, rate around 100 bpm. Respiratory: Normal respiratory effort without tachypnea nor retractions. Breath sounds are clear  Gastrointestinal: soft, moderate suprapubic tenderness to palpation, no rebound or guarding. No distention. Abse benign. Musculoskeletal: Nontender with normal range of motion in all extremities.  Neurologic:  Normal speech and language.  No gross focal neurologic deficits  Skin:  Skin is warm, dry and intact.  Psychiatric: Mood and affect are normal. Speech and behavior are normal.   ____________________________________________    EKG  EKG reviewed and interpreted by myself shows normal sinus rhythm at 93 bpm. Narrow QRS, normal axis, normal intervals, no see changes. Reassuring EKG.  ____________________________________________    INITIAL IMPRESSION / ASSESSMENT AND PLAN / ED COURSE  Pertinent labs & imaging results that were available during my care of the patient were  reviewed by me and considered in my medical decision making (see chart for details).  patient presents the emergency department with fever, body aches, dysuria and lower abdominal pain. We will check labs. Patient is febrile to 101.5 in the emergency department a heart rate of 96. Sepsis protocols of been initiated.  Patient's labs have resulted showing too numerous to count white blood cells within his urine. With a white blood cell count of 14,000, given the patient's fever elevated white blood cell count and mild tachycardia he meets sepsis criteria. We will continue with IV antibiotics, IV fluids and admitted to the hospital for further treatment.  CRITICAL CARE Performed by: Minna Antis   Total critical care time: 30 minutes  Critical care time was exclusive of separately billable procedures and treating other patients.  Critical care was necessary to treat or prevent imminent or life-threatening deterioration.  Critical care was time spent personally by me on the following activities: development of treatment plan with patient and/or surrogate as well as nursing, discussions with consultants, evaluation of patient's response to treatment, examination of patient, obtaining history from patient or surrogate, ordering and performing treatments and interventions, ordering and review of laboratory studies, ordering and review of radiographic studies, pulse oximetry and re-evaluation of patient's condition.   ____________________________________________   FINAL CLINICAL IMPRESSION(S) / ED DIAGNOSES  sepsis fever Urinary tract infection   Minna Antis, MD 09/22/16 2052

## 2016-09-22 NOTE — Progress Notes (Addendum)
ANTIBIOTIC CONSULT NOTE - INITIAL  Pharmacy Consult for Vancomycin, Cefepime  Indication: sepsis  Allergies  Allergen Reactions  . Other     Animal Dandruff Hay    . Pollen Extract     Patient Measurements: Height: 5' 11.5" (181.6 cm) Weight: 192 lb (87.1 kg) IBW/kg (Calculated) : 76.45 Adjusted Body Weight: 80 kg   Vital Signs: Temp: 97.7 F (36.5 C) (01/14 2239) Temp Source: Oral (01/14 2239) BP: 122/67 (01/14 2239) Pulse Rate: 82 (01/14 2239) Intake/Output from previous day: No intake/output data recorded. Intake/Output from this shift: Total I/O In: 1050 [IV Piggyback:1050] Out: -   Labs:  Recent Labs  09/22/16 1929  WBC 14.9*  HGB 13.9  PLT 179  CREATININE 0.98   Estimated Creatinine Clearance: 98.7 mL/min (by C-G formula based on SCr of 0.98 mg/dL). No results for input(s): VANCOTROUGH, VANCOPEAK, VANCORANDOM, GENTTROUGH, GENTPEAK, GENTRANDOM, TOBRATROUGH, TOBRAPEAK, TOBRARND, AMIKACINPEAK, AMIKACINTROU, AMIKACIN in the last 72 hours.   Microbiology: No results found for this or any previous visit (from the past 720 hour(s)).  Medical History: Past Medical History:  Diagnosis Date  . Bipolar 1 disorder (HCC)   . Depression   . Keratoconus of right eye     Medications:  Prescriptions Prior to Admission  Medication Sig Dispense Refill Last Dose  . escitalopram (LEXAPRO) 10 MG tablet Take 10 mg by mouth daily.     . ondansetron (ZOFRAN) 4 MG tablet Take 1 tablet (4 mg total) by mouth daily as needed for nausea or vomiting. 30 tablet 1   . oxyCODONE-acetaminophen (ROXICET) 5-325 MG tablet Take 1 tablet by mouth every 6 (six) hours as needed. 20 tablet 0   . risperiDONE (RISPERDAL) 0.5 MG tablet Take 0.5 mg by mouth daily.      Assessment: CrCl = 103.2 ml/min ke = 0.09 hr-1 T1/2 = 7.7 hrs Vd = 61 L   Goal of Therapy:  Vancomycin trough level 15-20 mcg/ml  Plan:  Expected duration 7 days with resolution of temperature and/or normalization of  WBC   Cefepime 1 gm IV X 1 given on 1/14 @ 20:41. Cefepime 1 gm IV X 1 ordered to be given on 1/14 @ 23:00 to make total starting dose of 2 gm. Cefepime 2 gm IV Q8H ordered to start on 1/15 @ 0500.   Vancomycin 1250 mg IV X 1 to be given on 1/15 @ 00:00. Vancomycin 1250 mg IV Q8H ordered to start on 1/15 @ 0600, 6 hrs after 1st dose (stacked dosing). This pt will reach [facility] on 1/16 @ 12:00. Will draw 1st trough on 1/16 @ 13:30 which will be at Css.   Dayana Dalporto D 09/22/2016,11:12 PM

## 2016-09-23 ENCOUNTER — Inpatient Hospital Stay: Payer: BLUE CROSS/BLUE SHIELD

## 2016-09-23 LAB — CBC
HCT: 37.2 % — ABNORMAL LOW (ref 40.0–52.0)
Hemoglobin: 12.6 g/dL — ABNORMAL LOW (ref 13.0–18.0)
MCH: 31 pg (ref 26.0–34.0)
MCHC: 33.9 g/dL (ref 32.0–36.0)
MCV: 91.5 fL (ref 80.0–100.0)
PLATELETS: 168 10*3/uL (ref 150–440)
RBC: 4.06 MIL/uL — AB (ref 4.40–5.90)
RDW: 14.1 % (ref 11.5–14.5)
WBC: 12.7 10*3/uL — AB (ref 3.8–10.6)

## 2016-09-23 LAB — BASIC METABOLIC PANEL
Anion gap: 7 (ref 5–15)
BUN: 11 mg/dL (ref 6–20)
CALCIUM: 7.9 mg/dL — AB (ref 8.9–10.3)
CO2: 27 mmol/L (ref 22–32)
CREATININE: 0.72 mg/dL (ref 0.61–1.24)
Chloride: 103 mmol/L (ref 101–111)
Glucose, Bld: 195 mg/dL — ABNORMAL HIGH (ref 65–99)
Potassium: 3.8 mmol/L (ref 3.5–5.1)
SODIUM: 137 mmol/L (ref 135–145)

## 2016-09-23 MED ORDER — RISPERIDONE 0.5 MG PO TABS
0.5000 mg | ORAL_TABLET | Freq: Every day | ORAL | Status: DC
  Administered 2016-09-23 – 2016-09-25 (×3): 0.5 mg via ORAL
  Filled 2016-09-23 (×4): qty 1

## 2016-09-23 MED ORDER — POLYETHYLENE GLYCOL 3350 17 G PO PACK
17.0000 g | PACK | Freq: Every day | ORAL | Status: DC | PRN
  Administered 2016-09-24: 17 g via ORAL
  Filled 2016-09-23: qty 1

## 2016-09-23 MED ORDER — CEFTRIAXONE SODIUM-DEXTROSE 1-3.74 GM-% IV SOLR
1.0000 g | INTRAVENOUS | Status: DC
  Administered 2016-09-23 – 2016-09-24 (×2): 1 g via INTRAVENOUS
  Filled 2016-09-23 (×2): qty 50

## 2016-09-23 MED ORDER — SENNOSIDES-DOCUSATE SODIUM 8.6-50 MG PO TABS
2.0000 | ORAL_TABLET | Freq: Every day | ORAL | Status: DC
  Administered 2016-09-23 – 2016-09-25 (×3): 2 via ORAL
  Filled 2016-09-23 (×3): qty 2

## 2016-09-23 MED ORDER — KETOROLAC TROMETHAMINE 30 MG/ML IJ SOLN
30.0000 mg | Freq: Once | INTRAMUSCULAR | Status: AC
  Administered 2016-09-23: 30 mg via INTRAVENOUS
  Filled 2016-09-23: qty 1

## 2016-09-23 MED ORDER — ESCITALOPRAM OXALATE 10 MG PO TABS
10.0000 mg | ORAL_TABLET | Freq: Every day | ORAL | Status: DC
  Administered 2016-09-23 – 2016-09-25 (×3): 10 mg via ORAL
  Filled 2016-09-23 (×3): qty 1

## 2016-09-23 MED ORDER — DEXTROSE 5 % IV SOLN
Freq: Three times a day (TID) | INTRAVENOUS | Status: DC
  Filled 2016-09-23: qty 50

## 2016-09-23 NOTE — Progress Notes (Signed)
[facility] [place] [facility]   PATIENT NAME: Micheal Gay    MR#:  [id number]  DATE OF BIRTH:  Jun 04, 1966  SUBJECTIVE:  CHIEF COMPLAINT:   Chief Complaint  Patient presents with  . Dysuria  . Generalized Body Aches  . Dizziness  . Altered Mental Status   - complains Of significant headache. Dysuria is improving. No further nausea or vomiting. Fever last night  REVIEW OF SYSTEMS:  Review of Systems  Constitutional: Positive for fever. Negative for chills and malaise/fatigue.  HENT: Negative for ear discharge, hearing loss and nosebleeds.   Eyes: Negative for blurred vision and double vision.  Respiratory: Negative for cough, shortness of breath and wheezing.   Cardiovascular: Negative for chest pain, palpitations and leg swelling.  Gastrointestinal: Positive for nausea. Negative for abdominal pain, constipation, diarrhea and vomiting.  Genitourinary: Positive for dysuria.  Musculoskeletal: Negative for myalgias.  Neurological: Positive for headaches. Negative for dizziness, sensory change, speech change, focal weakness and seizures.  Psychiatric/Behavioral: Negative for depression.    DRUG ALLERGIES:   Allergies  Allergen Reactions  . Other     Animal Dandruff Hay    . Pollen Extract     VITALS:  Blood pressure 112/61, pulse 67, temperature 98.6 F (37 C), temperature source Oral, resp. rate 16, height 5' 11.5" (1.816 m), weight 87.1 kg (192 lb), SpO2 95 %.  PHYSICAL EXAMINATION:  Physical Exam  GENERAL:  [age] y.o.-year-old patient lying in the bed with no acute distress.  EYES: Pupils equal, round, reactive to light and accommodation. No scleral icterus. Extraocular muscles intact.  HEENT: Head atraumatic, normocephalic. Oropharynx and nasopharynx clear.  NECK:  Supple, no jugular venous distention. No thyroid enlargement, no tenderness.  LUNGS: Normal breath sounds bilaterally, no wheezing, rales,rhonchi or crepitation. No use of  accessory muscles of respiration.  CARDIOVASCULAR: S1, S2 normal. No murmurs, rubs, or gallops.  ABDOMEN: Soft, nontender, nondistended. Bowel sounds present. No organomegaly or mass.  EXTREMITIES: No pedal edema, cyanosis, or clubbing.  NEUROLOGIC: Cranial nerves II through XII are intact. Muscle strength 5/5 in all extremities. Sensation intact. Gait not checked.  PSYCHIATRIC: The patient is alert and oriented x 3.  SKIN: No obvious rash, lesion, or ulcer.    LABORATORY PANEL:   CBC  Recent Labs Lab 09/23/16 0406  WBC 12.7*  HGB 12.6*  HCT 37.2*  PLT 168   ------------------------------------------------------------------------------------------------------------------  Chemistries   Recent Labs Lab 09/22/16 1929 09/23/16 0406  NA 133* 137  K 3.7 3.8  CL 97* 103  CO2 30 27  GLUCOSE 167* 195*  BUN 16 11  CREATININE 0.98 0.72  CALCIUM 8.5* 7.9*  AST 46*  --   ALT 39  --   ALKPHOS 52  --   BILITOT 0.9  --    ------------------------------------------------------------------------------------------------------------------  Cardiac Enzymes No results for input(s): TROPONINI in the last 168 hours. ------------------------------------------------------------------------------------------------------------------  RADIOLOGY:  Dg Chest 2 View  Result Date: 09/23/2016 CLINICAL DATA:  Fever. EXAM: CHEST  2 VIEW COMPARISON:  01/26/2014 . FINDINGS: Mediastinum and hilar structures are normal. Low lung volumes with mild bibasilar infiltrates. No pleural effusion or pneumothorax. IMPRESSION: Low lung volumes with mild bibasilar infiltrates consistent with pneumonia. Electronically Signed   By: Maisie Fushomas  Register   On: 09/23/2016 14:13    EKG:   Orders placed or performed during the hospital encounter of 09/22/16  . EKG 12-Lead  . EKG 12-Lead    ASSESSMENT AND PLAN:   51 year old male with past  medical history significant for depression and bipolar disorder comes to  hospital secondary to fevers and dysuria.  #1 sepsis-secondary to acute cystitis and prostatitis cannot be ruled out. -Awaiting blood and urine cultures. Changed antibiotics to Rocephin. -Monitor fevers and white count. -Outpatient urology follow-up after discharge.  #2 bipolar and depression-continue risperidone, Lexapro.  #3 DVT prophylaxis-on Lovenox   All the records are reviewed and case discussed with Care Management/Social Workerr. Management plans discussed with the patient, family and they are in agreement.  CODE STATUS: Full code  TOTAL TIME TAKING CARE OF THIS PATIENT: 37 minutes.   POSSIBLE D/C tomorrow, DEPENDING ON CLINICAL CONDITION.   Enid Baas M.D on 09/23/2016 at 4:51 PM  Between 7am to 6pm - Pager - [phone]  After 6pm go to [email or web address] - Social research officer, government  [facility]  Office  [phone]  CC: Primary care physician; Luna Fuse, MD

## 2016-09-23 NOTE — Progress Notes (Signed)
Inpatient Diabetes Program Recommendations  AACE/ADA: New Consensus Statement on Inpatient Glycemic Control (2015)  Target Ranges:  Prepandial:   less than 140 mg/dL      Peak postprandial:   less than 180 mg/dL (1-2 hours)      Critically ill patients:  140 - 180 mg/dL   Results for Micheal Gay, Tanuj A (MRN [id number]) as of 09/23/2016 09:59  Ref. Range 09/22/2016 19:29 09/23/2016 04:06  Glucose Latest Ref Range: 65 - 99 mg/dL 409167 (H) 811195 (H)   Review of Glycemic Control  Diabetes history: No Outpatient Diabetes medications: NA Current orders for Inpatient glycemic control: None  Inpatient Diabetes Program Recommendations: Correction (SSI): Please consider ordering CBGs with Novolog 0-9 units TID with meals and Novolog 0-5 units QHS. HgbA1C: Please consider ordering an A1C to evaluate glycemic control over the past 2-3 months.  NOTE: Patient does not have a documented history of DM. Noted initial glucose 167 mg/dl and fasting lab glucose 195 mg/dl this morning.   Thanks, Orlando PennerMarie Randye Treichler, RN, MSN, CDE Diabetes Coordinator Inpatient Diabetes Program [phone] (Team Pager from 8am to 5pm)

## 2016-09-23 NOTE — Progress Notes (Signed)
Pharmacy Antibiotic Note  Micheal SorrowLarry A Delima is a 51 y.o. male admitted on 09/22/2016 with UTI.  Pharmacy has been consulted for ceftriaxone dosing.  Plan: Ceftriaxone 1 g IV q24h  Height: 5' 11.5" (181.6 cm) Weight: 192 lb (87.1 kg) IBW/kg (Calculated) : 76.45  Temp (24hrs), Avg:99.2 F (37.3 C), Min:97.6 F (36.4 C), Max:101.5 F (38.6 C)   Recent Labs Lab 09/22/16 1929 09/23/16 0406  WBC 14.9* 12.7*  CREATININE 0.98 0.72  LATICACIDVEN 1.5  --     Estimated Creatinine Clearance: 120.9 mL/min (by C-G formula based on SCr of 0.72 mg/dL).    Allergies  Allergen Reactions  . Other     Animal Dandruff Hay    . Pollen Extract     Antimicrobials this admission: Vanc 1/15-1/15 Cefepime 1/14-1/15 Ceftriaxone 1/15 >>  Dose adjustments this admission:   Microbiology results: 1/14 BCx: NGTD x2 1/14 UCx: sent    Thank you for allowing pharmacy to be a part of this patient's care.  Marty HeckWang, Darran Gabay L 09/23/2016 1:33 PM

## 2016-09-23 NOTE — Progress Notes (Signed)
New Admission Note:   Arrival Method: per stretcher from ED, pt came from home Mental Orientation: alert and oriented X4 Telemetry: placed on box 4052, CCMD notified, verified with RN French Anaracy Assessment: Completed Skin: warm, dry, intact, no preexisting wounds noted IV: G20 on the right AC, G20 on the left forearm, both with transparent dressing, intact Pain: denies any pain as of this time. Per pt, he received a "pain shot" in the ED Safety Measures: Safety Fall Prevention Plan has been given and discussed. Pt educated about bed alarm, yellow socks on, pt had a fall today. Admission: Completed 1A Orientation: Patient has been oriented to the room, unit and staff.  Family: girlfriend at bedside  Orders have been reviewed and implemented. Will continue to monitor the patient. Call light has been placed within reach and bed alarm has been activated.   Janice NorrieAnessa Adalay Azucena BSN, RN [facility] 1A

## 2016-09-24 LAB — CBC
HEMATOCRIT: 38.2 % — AB (ref 40.0–52.0)
Hemoglobin: 13.2 g/dL (ref 13.0–18.0)
MCH: 31.8 pg (ref 26.0–34.0)
MCHC: 34.5 g/dL (ref 32.0–36.0)
MCV: 92.3 fL (ref 80.0–100.0)
Platelets: 170 10*3/uL (ref 150–440)
RBC: 4.14 MIL/uL — ABNORMAL LOW (ref 4.40–5.90)
RDW: 13.9 % (ref 11.5–14.5)
WBC: 7.9 10*3/uL (ref 3.8–10.6)

## 2016-09-24 LAB — PSA: PSA: 12.6 ng/mL — AB (ref 0.00–4.00)

## 2016-09-24 LAB — INFLUENZA PANEL BY PCR (TYPE A & B)
INFLAPCR: NEGATIVE
Influenza B By PCR: NEGATIVE

## 2016-09-24 MED ORDER — SODIUM CHLORIDE 0.9 % IV SOLN
INTRAVENOUS | Status: AC
  Administered 2016-09-24 – 2016-09-25 (×2): via INTRAVENOUS

## 2016-09-24 MED ORDER — IBUPROFEN 600 MG PO TABS
600.0000 mg | ORAL_TABLET | Freq: Once | ORAL | Status: AC
  Administered 2016-09-24: 600 mg via ORAL
  Filled 2016-09-24: qty 1

## 2016-09-24 MED ORDER — BISACODYL 5 MG PO TBEC
10.0000 mg | DELAYED_RELEASE_TABLET | Freq: Once | ORAL | Status: AC
  Administered 2016-09-24: 10 mg via ORAL
  Filled 2016-09-24: qty 2

## 2016-09-24 NOTE — Progress Notes (Signed)
[facility] [facility]   PATIENT NAME: Micheal ChalkLarry Gay    MR#:  [id number]  DATE OF BIRTH:  1966-05-29  SUBJECTIVE:  CHIEF COMPLAINT:   Chief Complaint  Patient presents with  . Dysuria  . Generalized Body Aches  . Dizziness  . Altered Mental Status   - fevers again early this AM, tmax 102.22F - complains of epigastric pain, headache and myalgias  REVIEW OF SYSTEMS:  Review of Systems  Constitutional: Positive for fever. Negative for chills and malaise/fatigue.  HENT: Negative for ear discharge, hearing loss and nosebleeds.   Eyes: Negative for blurred vision and double vision.  Respiratory: Negative for cough, shortness of breath and wheezing.   Cardiovascular: Negative for chest pain, palpitations and leg swelling.  Gastrointestinal: Positive for nausea. Negative for abdominal pain, constipation, diarrhea and vomiting.  Genitourinary: Positive for dysuria.  Musculoskeletal: Negative for myalgias.  Neurological: Positive for headaches. Negative for dizziness, sensory change, speech change, focal weakness and seizures.  Psychiatric/Behavioral: Negative for depression.    DRUG ALLERGIES:   Allergies  Allergen Reactions  . Other     Animal Dandruff Hay    . Pollen Extract     VITALS:  Blood pressure 128/68, pulse 81, temperature 99.7 F (37.6 C), resp. rate 18, height 5' 11.5" (1.816 m), weight 87.1 kg (192 lb), SpO2 99 %.  PHYSICAL EXAMINATION:  Physical Exam  GENERAL:  51 y.o.-year-old patient lying in the bed with no acute distress.  EYES: Pupils equal, round, reactive to light and accommodation. No scleral icterus. Extraocular muscles intact. erythematous conjunctiva HEENT: Head atraumatic, normocephalic. Oropharynx and nasopharynx clear.  NECK:  Supple, no jugular venous distention. No thyroid enlargement, no tenderness.  LUNGS: Normal breath sounds bilaterally, no wheezing, rales,rhonchi or crepitation. No use of accessory  muscles of respiration.  CARDIOVASCULAR: S1, S2 normal. No murmurs, rubs, or gallops.  ABDOMEN: Soft, nontender, nondistended. Bowel sounds present. No organomegaly or mass.  EXTREMITIES: No pedal edema, cyanosis, or clubbing.  NEUROLOGIC: Cranial nerves II through XII are intact. Muscle strength 5/5 in all extremities. Sensation intact. Gait not checked.  PSYCHIATRIC: The patient is alert and oriented x 3.  SKIN: No obvious rash, lesion, or ulcer.    LABORATORY PANEL:   CBC  Recent Labs Lab 09/24/16 0523  WBC 7.9  HGB 13.2  HCT 38.2*  PLT 170   ------------------------------------------------------------------------------------------------------------------  Chemistries   Recent Labs Lab 09/22/16 1929 09/23/16 0406  NA 133* 137  K 3.7 3.8  CL 97* 103  CO2 30 27  GLUCOSE 167* 195*  BUN 16 11  CREATININE 0.98 0.72  CALCIUM 8.5* 7.9*  AST 46*  --   ALT 39  --   ALKPHOS 52  --   BILITOT 0.9  --    ------------------------------------------------------------------------------------------------------------------  Cardiac Enzymes No results for input(s): TROPONINI in the last 168 hours. ------------------------------------------------------------------------------------------------------------------  RADIOLOGY:  Dg Chest 2 View  Result Date: 09/23/2016 CLINICAL DATA:  Fever. EXAM: CHEST  2 VIEW COMPARISON:  01/26/2014 . FINDINGS: Mediastinum and hilar structures are normal. Low lung volumes with mild bibasilar infiltrates. No pleural effusion or pneumothorax. IMPRESSION: Low lung volumes with mild bibasilar infiltrates consistent with pneumonia. Electronically Signed   By: Maisie Fushomas  Register   On: 09/23/2016 14:13    EKG:   Orders placed or performed during the hospital encounter of 09/22/16  . EKG 12-Lead  . EKG 12-Lead    ASSESSMENT AND PLAN:   51 year old male with past medical history  significant for depression and bipolar disorder comes to hospital  secondary to fevers and dysuria.  #1 sepsis-secondary to acute cystitis and prostatitis cannot be ruled out. -Awaiting blood and urine cultures. on Rocephin. - if fevers spike- change to meropenem - wbc improving. Flu pcr negative -Outpatient urology follow-up after discharge.  #2 Headache- underlying viral fever too - symptomatic trt, no h/o migraines  #3 bipolar and depression-continue risperidone, Lexapro.  #4 DVT prophylaxis-on Lovenox  #5 Constipation- meds added   Discharge after fevers resolve.   All the records are reviewed and case discussed with Care Management/Social Workerr. Management plans discussed with the patient, family and they are in agreement.  CODE STATUS: Full code  TOTAL TIME TAKING CARE OF THIS PATIENT: 33 minutes.   POSSIBLE D/C tomorrow, DEPENDING ON CLINICAL CONDITION.   Enid Baas M.D on 09/24/2016 at 10:04 AM  Between 7am to 6pm - Pager - [phone]  After 6pm go to [email or web address] - Social research officer, government  [facility]  Office  [phone]  CC: Primary care physician; Luna Fuse, MD

## 2016-09-24 NOTE — Progress Notes (Signed)
Tylenol administered for temp 102.7. Pt lying in bed with c/o headache.

## 2016-09-25 LAB — BASIC METABOLIC PANEL
Anion gap: 6 (ref 5–15)
BUN: 15 mg/dL (ref 6–20)
CHLORIDE: 102 mmol/L (ref 101–111)
CO2: 29 mmol/L (ref 22–32)
CREATININE: 0.82 mg/dL (ref 0.61–1.24)
Calcium: 8.3 mg/dL — ABNORMAL LOW (ref 8.9–10.3)
GFR calc Af Amer: >60 mL/min (ref >60)
GFR calc non Af Amer: >60 mL/min (ref >60)
GLUCOSE: 123 mg/dL — AB (ref 65–99)
Potassium: 3.9 mmol/L (ref 3.5–5.1)
Sodium: 137 mmol/L (ref 135–145)

## 2016-09-25 LAB — HEMOGLOBIN A1C
Hgb A1c MFr Bld: 5.9 % — ABNORMAL HIGH (ref 4.8–5.6)
Mean Plasma Glucose: 123 mg/dL

## 2016-09-25 LAB — CBC
HEMATOCRIT: 39.9 % — AB (ref 40.0–52.0)
HEMOGLOBIN: 13.7 g/dL (ref 13.0–18.0)
MCH: 31.4 pg (ref 26.0–34.0)
MCHC: 34.2 g/dL (ref 32.0–36.0)
MCV: 91.9 fL (ref 80.0–100.0)
Platelets: 168 10*3/uL (ref 150–440)
RBC: 4.35 MIL/uL — ABNORMAL LOW (ref 4.40–5.90)
RDW: 13.7 % (ref 11.5–14.5)
WBC: 4.3 10*3/uL (ref 3.8–10.6)

## 2016-09-25 LAB — URINE CULTURE

## 2016-09-25 MED ORDER — SODIUM CHLORIDE 0.9 % IV SOLN: 500.0000 mg | Freq: Three times a day (TID) | INTRAVENOUS | Status: DC

## 2016-09-25 MED ORDER — MEROPENEM-SODIUM CHLORIDE 500 MG/50ML IV SOLR
500.0000 mg | Freq: Three times a day (TID) | INTRAVENOUS | Status: DC
  Administered 2016-09-25 – 2016-09-26 (×4): 500 mg via INTRAVENOUS
  Filled 2016-09-25 (×6): qty 50

## 2016-09-25 MED ORDER — IBUPROFEN 400 MG PO TABS
800.0000 mg | ORAL_TABLET | Freq: Once | ORAL | Status: AC
Start: 2016-09-25 — End: 2016-09-25
  Administered 2016-09-25: 800 mg via ORAL
  Filled 2016-09-25: qty 2

## 2016-09-25 NOTE — Care Management (Signed)
Cultures pending. Patient currently requiring IV antibiotics. Referral to [facility] in case IV antibiotics are needed at discharge. RNCM will continue to follow.

## 2016-09-25 NOTE — Progress Notes (Signed)
[facility] - [place] at [facility]   PATIENT NAME: Micheal ChalkLarry Gay    MR#:  [id number]  DATE OF BIRTH:  1966-08-05  SUBJECTIVE:  CHIEF COMPLAINT:   Chief Complaint  Patient presents with  . Dysuria  . Generalized Body Aches  . Dizziness  . Altered Mental Status   - fevers spacing out, but had a last temp again 101.59F early this morning - still has headaches  REVIEW OF SYSTEMS:  Review of Systems  Constitutional: Positive for fever. Negative for chills and malaise/fatigue.  HENT: Negative for ear discharge, hearing loss and nosebleeds.   Eyes: Negative for blurred vision and double vision.  Respiratory: Negative for cough, shortness of breath and wheezing.   Cardiovascular: Negative for chest pain, palpitations and leg swelling.  Gastrointestinal: Positive for nausea. Negative for abdominal pain, constipation, diarrhea and vomiting.  Genitourinary: Positive for dysuria.  Musculoskeletal: Negative for myalgias.  Neurological: Positive for headaches. Negative for dizziness, sensory change, speech change, focal weakness and seizures.  Psychiatric/Behavioral: Negative for depression.    DRUG ALLERGIES:   Allergies  Allergen Reactions  . Other     Animal Dandruff Hay    . Pollen Extract     VITALS:  Blood pressure 116/65, pulse 64, temperature 98.9 F (37.2 C), temperature source Oral, resp. rate 18, height 5' 11.5" (1.816 m), weight 87.1 kg (192 lb), SpO2 96 %.  PHYSICAL EXAMINATION:  Physical Exam  GENERAL:  51 y.o.-year-old patient lying in the bed with no acute distress.  EYES: Pupils equal, round, reactive to light and accommodation. No scleral icterus. Extraocular muscles intact. erythematous conjunctiva HEENT: Head atraumatic, normocephalic. Oropharynx and nasopharynx clear.  NECK:  Supple, no jugular venous distention. No thyroid enlargement, no tenderness.  LUNGS: Normal breath sounds bilaterally, no wheezing, rales,rhonchi or crepitation.  No use of accessory muscles of respiration.  CARDIOVASCULAR: S1, S2 normal. No murmurs, rubs, or gallops.  ABDOMEN: Soft, nontender, nondistended. Bowel sounds present. No organomegaly or mass.  EXTREMITIES: No pedal edema, cyanosis, or clubbing.  NEUROLOGIC: Cranial nerves II through XII are intact. Muscle strength 5/5 in all extremities. Sensation intact. Gait not checked.  PSYCHIATRIC: The patient is alert and oriented x 3.  SKIN: No obvious rash, lesion, or ulcer.    LABORATORY PANEL:   CBC  Recent Labs Lab 09/25/16 0623  WBC 4.3  HGB 13.7  HCT 39.9*  PLT 168   ------------------------------------------------------------------------------------------------------------------  Chemistries   Recent Labs Lab 09/22/16 1929  09/25/16 0623  NA 133*  < > 137  K 3.7  < > 3.9  CL 97*  < > 102  CO2 30  < > 29  GLUCOSE 167*  < > 123*  BUN 16  < > 15  CREATININE 0.98  < > 0.82  CALCIUM 8.5*  < > 8.3*  AST 46*  --   --   ALT 39  --   --   ALKPHOS 52  --   --   BILITOT 0.9  --   --   < > = values in this interval not displayed. ------------------------------------------------------------------------------------------------------------------  Cardiac Enzymes No results for input(s): TROPONINI in the last 168 hours. ------------------------------------------------------------------------------------------------------------------  RADIOLOGY:  Dg Chest 2 View  Result Date: 09/23/2016 CLINICAL DATA:  Fever. EXAM: CHEST  2 VIEW COMPARISON:  01/26/2014 . FINDINGS: Mediastinum and hilar structures are normal. Low lung volumes with mild bibasilar infiltrates. No pleural effusion or pneumothorax. IMPRESSION: Low lung volumes with mild bibasilar infiltrates consistent with pneumonia. Electronically  Signed   By: Maisie Fus  Register   On: 09/23/2016 14:13    EKG:   Orders placed or performed during the hospital encounter of 09/22/16  . EKG 12-Lead  . EKG 12-Lead    ASSESSMENT AND  PLAN:   51 year old male with past medical history significant for depression and bipolar disorder comes to hospital secondary to fevers and dysuria.  #1 sepsis-secondary to acute cystitis and prostatitis cannot be ruled out. -negative blood cultures and urine cultures growing E.coli- sensitivities pending.  Rocephin changed to meropenem now as fevers still spiking. - wbc improving. Flu pcr negative -Outpatient urology follow-up after discharge. - psa elevated  #2 Headache- underlying viral fever too - symptomatic trt, no h/o migraines Add motrin  #3 bipolar and depression-continue risperidone, Lexapro.  #4 DVT prophylaxis-on Lovenox  #5 Constipation- meds added   Discharge after fevers resolve. Likely tomorrow   All the records are reviewed and case discussed with Care Management/Social Workerr. Management plans discussed with the patient, family and they are in agreement.  CODE STATUS: Full code  TOTAL TIME TAKING CARE OF THIS PATIENT: 33 minutes.   POSSIBLE D/C tomorrow, DEPENDING ON CLINICAL CONDITION.   Enid Baas M.D on 09/25/2016 at 9:06 AM  Between 7am to 6pm - Pager - [phone]  After 6pm go to [email or web address] - Social research officer, government  [facility]  Office  [phone]  CC: Primary care physician; Luna Fuse, MD

## 2016-09-25 NOTE — Progress Notes (Signed)
Rechecked temp after tylenol. Pt remained febrile at 102. Dr Sheryle Hailiamond notified. Will place order for ibuprofen

## 2016-09-25 NOTE — Progress Notes (Signed)
650 mg tylenol given for temp 101.6

## 2016-09-26 MED ORDER — CEPHALEXIN 250 MG PO CAPS: 250.0000 mg | ORAL_CAPSULE | Freq: Three times a day (TID) | ORAL | 0 refills | Status: DC

## 2016-09-26 MED ORDER — SENNOSIDES-DOCUSATE SODIUM 8.6-50 MG PO TABS: 2.0000 | ORAL_TABLET | Freq: Every day | ORAL | 0 refills | Status: DC

## 2016-09-26 NOTE — Progress Notes (Signed)
Patient was discharged home with friends. IV removed with cath intact. Reviewed meds and last dose given. Scripts sent to [facility]. Informed patient that follow-up appointments could not be made d/t offices been closed. Patient understands that he has to call and made appointments tomorrow. Work excuse also given to patient.

## 2016-09-26 NOTE — Discharge Summary (Signed)
[facility] - [place] at [facility]   PATIENT NAME: Micheal Gay    MR#:  [id number]  DATE OF BIRTH:  [birth year]  DATE OF ADMISSION:  09/22/2016   ADMITTING PHYSICIAN: Oralia Manis, MD  DATE OF DISCHARGE: 09/26/2016  PRIMARY CARE PHYSICIAN: Luna Fuse, MD   ADMISSION DIAGNOSIS:   Lower urinary tract infectious disease [N39.0] Sepsis, due to unspecified organism (HCC) [A41.9] Fever, unspecified fever cause [R50.9]  DISCHARGE DIAGNOSIS:   Principal Problem:   Sepsis (HCC) Active Problems:   UTI (urinary tract infection)   Depression   Bipolar 1 disorder (HCC)   SECONDARY DIAGNOSIS:   Past Medical History:  Diagnosis Date  . Bipolar 1 disorder (HCC)   . Depression   . Keratoconus of right eye     HOSPITAL COURSE:   51 year old male with past medical history significant for depression and bipolar disorder comes to hospital secondary to fevers and dysuria.  #1 sepsis-secondary to acute cystitis and prostatitis cannot be ruled out. -negative blood cultures and urine cultures growing E.coli- sensitivitie to cephalosporins.  - Rocephin was started initially and then changed to meropenem for spiking fevers. However sensitivities with E.coli sensitive to all ABX - Being discharged on keflex. - wbc improving. Flu pcr negative -Outpatient urology follow-up after discharge. - psa elevated  #2 Headache- underlying viral fever too - symptomatic trt, no h/o migraines Added motrin prn  #3 bipolar and depression-continue risperidone, Lexapro.  #4 Constipation- meds added  Discharge today  DISCHARGE CONDITIONS:   Stable  CONSULTS OBTAINED:    None  DRUG ALLERGIES:   Allergies  Allergen Reactions  . Other     Animal Dandruff Hay    . Pollen Extract    DISCHARGE MEDICATIONS:   Allergies as of 09/26/2016      Reactions   Other    Animal Dandruff Hay    Pollen Extract       Medication List    TAKE these  medications   cephALEXin 250 MG capsule Commonly known as:  KEFLEX Take 1 capsule (250 mg total) by mouth 3 (three) times daily. X 5 more days   escitalopram 10 MG tablet Commonly known as:  LEXAPRO Take 10 mg by mouth daily.   risperiDONE 0.5 MG tablet Commonly known as:  RISPERDAL Take 0.5 mg by mouth daily.   senna-docusate 8.6-50 MG tablet Commonly known as:  Senokot-S Take 2 tablets by mouth at bedtime.        DISCHARGE INSTRUCTIONS:   1. PCP f/u in 1-2 week 2. Urology f/u in 1 week  DIET:   Regular diet  ACTIVITY:   Activity as tolerated  OXYGEN:   Home Oxygen: No.  Oxygen Delivery: room air  DISCHARGE LOCATION:   home   If you experience worsening of your admission symptoms, develop shortness of breath, life threatening emergency, suicidal or homicidal thoughts you must seek medical attention immediately by calling 911 or calling your MD immediately  if symptoms less severe.  You Must read complete instructions/literature along with all the possible adverse reactions/side effects for all the Medicines you take and that have been prescribed to you. Take any new Medicines after you have completely understood and accpet all the possible adverse reactions/side effects.   Please note  You were cared for by a hospitalist during your hospital stay. If you have any questions about your discharge medications or the care you received while you were in the hospital after you are discharged, you can  call the unit and asked to speak with the hospitalist on call if the hospitalist that took care of you is not available. Once you are discharged, your primary care physician will handle any further medical issues. Please note that NO REFILLS for any discharge medications will be authorized once you are discharged, as it is imperative that you return to your primary care physician (or establish a relationship with a primary care physician if you do not have one) for your  aftercare needs so that they can reassess your need for medications and monitor your lab values.    On the day of Discharge:  VITAL SIGNS:   Blood pressure 140/62, pulse 82, temperature 99 F (37.2 C), temperature source Oral, resp. rate 18, height 5' 11.5" (1.816 m), weight 87.1 kg (192 lb), SpO2 100 %.  PHYSICAL EXAMINATION:   GENERAL:  51 y.o.-year-old patient lying in the bed with no acute distress.  EYES: Pupils equal, round, reactive to light and accommodation. No scleral icterus. Extraocular muscles intact. erythematous conjunctiva HEENT: Head atraumatic, normocephalic. Oropharynx and nasopharynx clear.  NECK:  Supple, no jugular venous distention. No thyroid enlargement, no tenderness.  LUNGS: Normal breath sounds bilaterally, no wheezing, rales,rhonchi or crepitation. No use of accessory muscles of respiration.  CARDIOVASCULAR: S1, S2 normal. No murmurs, rubs, or gallops.  ABDOMEN: Soft, nontender, nondistended. Bowel sounds present. No organomegaly or mass.  EXTREMITIES: No pedal edema, cyanosis, or clubbing.  NEUROLOGIC: Cranial nerves II through XII are intact. Muscle strength 5/5 in all extremities. Sensation intact. Gait not checked.  PSYCHIATRIC: The patient is alert and oriented x 3.  SKIN: No obvious rash, lesion, or ulcer.    DATA REVIEW:   CBC  Recent Labs Lab 09/25/16 0623  WBC 4.3  HGB 13.7  HCT 39.9*  PLT 168    Chemistries   Recent Labs Lab 09/22/16 1929  09/25/16 0623  NA 133*  < > 137  K 3.7  < > 3.9  CL 97*  < > 102  CO2 30  < > 29  GLUCOSE 167*  < > 123*  BUN 16  < > 15  CREATININE 0.98  < > 0.82  CALCIUM 8.5*  < > 8.3*  AST 46*  --   --   ALT 39  --   --   ALKPHOS 52  --   --   BILITOT 0.9  --   --   < > = values in this interval not displayed.   Microbiology Results  Results for orders placed or performed during the hospital encounter of 09/22/16  Blood Culture (routine x 2)     Status: None (Preliminary result)   Collection  Time: 09/22/16  7:29 PM  Result Value Ref Range Status   Specimen Description BLOOD RIGHT ANTECUBITAL  Final   Special Requests   Final    BOTTLES DRAWN AEROBIC AND ANAEROBIC 11CCAERO,12CCANA   Culture NO GROWTH 3 DAYS  Final   Report Status PENDING  Incomplete  Blood Culture (routine x 2)     Status: None (Preliminary result)   Collection Time: 09/22/16  7:32 PM  Result Value Ref Range Status   Specimen Description BLOOD LEFT FOREARM  Final   Special Requests   Final    BOTTLES DRAWN AEROBIC AND ANAEROBIC 10CCAERO,8CCANA   Culture NO GROWTH 3 DAYS  Final   Report Status PENDING  Incomplete  Urine culture     Status: Abnormal   Collection Time: 09/22/16  7:36 PM  Result Value Ref Range Status   Specimen Description URINE, RANDOM  Final   Special Requests NONE  Final   Culture >=100,000 COLONIES/mL ESCHERICHIA COLI (A)  Final   Report Status 09/25/2016 FINAL  Final   Organism ID, Bacteria ESCHERICHIA COLI (A)  Final      Susceptibility   Escherichia coli - MIC*    AMPICILLIN 4 SENSITIVE Sensitive     CEFAZOLIN <=4 SENSITIVE Sensitive     CEFTRIAXONE <=1 SENSITIVE Sensitive     CIPROFLOXACIN <=0.25 SENSITIVE Sensitive     GENTAMICIN <=1 SENSITIVE Sensitive     IMIPENEM <=0.25 SENSITIVE Sensitive     NITROFURANTOIN <=16 SENSITIVE Sensitive     TRIMETH/SULFA <=20 SENSITIVE Sensitive     AMPICILLIN/SULBACTAM <=2 SENSITIVE Sensitive     PIP/TAZO <=4 SENSITIVE Sensitive     Extended ESBL NEGATIVE Sensitive     * >=100,000 COLONIES/mL ESCHERICHIA COLI    RADIOLOGY:  No results found.   Management plans discussed with the patient, family and they are in agreement.  CODE STATUS:     Code Status Orders        Start     Ordered   09/22/16 2238  Full code  Continuous     09/22/16 2237    Code Status History    Date Active Date Inactive Code Status Order ID Comments User Context   This patient has a current code status but no historical code status.      TOTAL TIME  TAKING CARE OF THIS PATIENT: 37 minutes.    Enid BaasKALISETTI,Shalev Helminiak M.D on 09/26/2016 at 9:11 AM  Between 7am to 6pm - Pager - [phone]  After 6pm go to [email or web address] - Social research officer, governmentpassword EPAS [facility]  [facility] [place] [facility]  Office  [phone]  CC: Primary care physician; Luna FuseEJAN-SIE, SHEIKH AHMED, MD   Note: This dictation was prepared with Dragon dictation along with smaller phrase technology. Any transcriptional errors that result from this process are unintentional.

## 2016-09-26 NOTE — Progress Notes (Signed)
     Micheal ChalkLarry Gay was admitted to the [facility] on 09/22/2016 for an acute medical condition and is being Discharged on  09/26/2016 . He will need another 3 days for recovery and so advised to stay away from work until then. So please excuse him from work for the above  Days. Should be able to return to work without any restrictions from 09/30/2016 .  Call Micheal Baasadhika Micheal Bordas  MD, [facility] at  [phone] with questions.  Micheal BaasKALISETTI,Micheal Gay M.D on 09/26/2016,at 9:14 AM  [facility] [address], [place] [id number]

## 2016-09-27 LAB — CULTURE, BLOOD (ROUTINE X 2)
CULTURE: NO GROWTH
Culture: NO GROWTH

## 2016-10-09 ENCOUNTER — Ambulatory Visit (INDEPENDENT_AMBULATORY_CARE_PROVIDER_SITE_OTHER): Payer: BLUE CROSS/BLUE SHIELD | Admitting: Urology

## 2016-10-09 ENCOUNTER — Encounter: Payer: Self-pay | Admitting: Urology

## 2016-10-09 VITALS — BP 125/66 | HR 85 | Ht 71.5 in | Wt 232.8 lb

## 2016-10-09 DIAGNOSIS — N390 Urinary tract infection, site not specified: Secondary | ICD-10-CM | POA: Diagnosis not present

## 2016-10-09 DIAGNOSIS — N529 Male erectile dysfunction, unspecified: Secondary | ICD-10-CM

## 2016-10-09 DIAGNOSIS — N138 Other obstructive and reflux uropathy: Secondary | ICD-10-CM

## 2016-10-09 DIAGNOSIS — N5319 Other ejaculatory dysfunction: Secondary | ICD-10-CM

## 2016-10-09 DIAGNOSIS — N401 Enlarged prostate with lower urinary tract symptoms: Secondary | ICD-10-CM

## 2016-10-09 DIAGNOSIS — N3000 Acute cystitis without hematuria: Secondary | ICD-10-CM | POA: Diagnosis not present

## 2016-10-09 DIAGNOSIS — F528 Other sexual dysfunction not due to a substance or known physiological condition: Secondary | ICD-10-CM | POA: Diagnosis not present

## 2016-10-09 LAB — URINALYSIS, COMPLETE
BILIRUBIN UA: NEGATIVE
GLUCOSE, UA: NEGATIVE
Ketones, UA: NEGATIVE
LEUKOCYTES UA: NEGATIVE
Nitrite, UA: NEGATIVE
PH UA: 5.5 (ref 5.0–7.5)
Protein, UA: NEGATIVE
SPEC GRAV UA: 1.02 (ref 1.005–1.030)
Urobilinogen, Ur: 0.2 mg/dL (ref 0.2–1.0)

## 2016-10-09 LAB — MICROSCOPIC EXAMINATION: Bacteria, UA: NONE SEEN

## 2016-10-09 LAB — BLADDER SCAN AMB NON-IMAGING: Scan Result: 105

## 2016-10-09 MED ORDER — TAMSULOSIN HCL 0.4 MG PO CAPS: 0.4000 mg | ORAL_CAPSULE | Freq: Every day | ORAL | 3 refills | Status: DC

## 2016-10-09 NOTE — Progress Notes (Signed)
10/09/2016 2:43 PM   Dorene SorrowLarry A Mcwilliams 1966-07-06 [id number]  Referring provider: Sherron MondayS Ahmed Tejan-Sie, MD [address] [place], [place] [id number]  Chief Complaint  Patient presents with  . New Patient (Initial Visit)    Recurrent UTI    HPI: Patient is a 51 year old Caucasian male who presents today as a referral from [facility] after an admission for UTI with sepsis he presents today his girlfriend, Pieter PartridgeMiriam.     Patient presented to the ED with the complaints of fevers, body aches and nausea.  He was also having several days of urinary hesitancy, frequency and incomplete voiding.  He also was complaining of ejaculatory disorder.  He was admitted with sepsis.  He spent four days in the hospital.  A PSA was obtained during his hospitalization and was found to be 12.60.  His urine culture was positive for E. Coli.   BPH WITH LUTS His IPSS score today is 23, which is severe lower urinary tract symptomatology.  He is terrible with his quality life due to his urinary symptoms.  His PVR is 105 mL.    His major complaints today are frequency, urgency, nocturia, incontinence and weak stream.  He has had these symptoms for the several months.  He denies any dysuria, hematuria or suprapubic pain.   He also denies any recent fevers, chills, nausea or vomiting.  He does not have a family history of PCa.      IPSS    Row Name 10/09/16 1300         International Prostate Symptom Score   How often have you had the sensation of not emptying your bladder? Less than half the time     How often have you had to urinate less than every two hours? Almost always     How often have you found you stopped and started again several times when you urinated? More than half the time     How often have you found it difficult to postpone urination? About half the time     How often have you had a weak urinary stream? About half the time     How often have you had to strain to start urination? About half the time      How many times did you typically get up at night to urinate? 3 Times     Total IPSS Score 23       Quality of Life due to urinary symptoms   If you were to spend the rest of your life with your urinary condition just the way it is now how would you feel about that? Terrible        Score:  1-7 Mild 8-19 Moderate 20-35 Severe  Erectile dysfunction His SHIM score is 12, which is mild to moderate ED.   He has been having difficulty with erections for the last several months   His major complaint is the inability to ejaculate.  His libido is per served.   His risk factors for ED are age, BPH, HLD, depression and bipolar 1 disorder.  He denies any painful erections or curvatures with his erections.        SHIM    Row Name 10/09/16 1352         SHIM: Over the last 6 months:   How do you rate your confidence that you could get and keep an erection? Low     When you had erections with sexual stimulation, how often were  your erections hard enough for penetration (entering your partner)? Sometimes (about half the time)     During sexual intercourse, how often were you able to maintain your erection after you had penetrated (entered) your partner? Difficult     During sexual intercourse, how difficult was it to maintain your erection to completion of intercourse? Very Difficult     When you attempted sexual intercourse, how often was it satisfactory for you? Very Difficult       SHIM Total Score   SHIM 12        Score: 1-7 Severe ED 8-11 Moderate ED 12-16 Mild-Moderate ED 17-21 Mild ED 22-25 No ED    PMH: Past Medical History:  Diagnosis Date  . Bipolar 1 disorder (HCC)   . Depression   . Keratoconus of right eye     Surgical History: Past Surgical History:  Procedure Laterality Date  . EYE SURGERY      Home Medications:  Allergies as of 10/09/2016      Reactions   Other    Animal Dandruff Hay    Pollen Extract       Medication List       Accurate as of  10/09/16  2:43 PM. Always use your most recent med list.          cephALEXin 250 MG capsule Commonly known as:  KEFLEX Take 1 capsule (250 mg total) by mouth 3 (three) times daily. X 5 more days   escitalopram 10 MG tablet Commonly known as:  LEXAPRO Take 10 mg by mouth daily.   FLUVIRIN PRESERVATIVE FREE 0.5 ML Susy Generic drug:  Influenza Vac Types A & B PF   risperiDONE 0.5 MG tablet Commonly known as:  RISPERDAL Take 0.5 mg by mouth daily.   senna-docusate 8.6-50 MG tablet Commonly known as:  Senokot-S Take 2 tablets by mouth at bedtime.   tamsulosin 0.4 MG Caps capsule Commonly known as:  FLOMAX Take 1 capsule (0.4 mg total) by mouth daily.       Allergies:  Allergies  Allergen Reactions  . Other     Animal Dandruff Hay    . Pollen Extract     Family History: Family History  Problem Relation Age of Onset  . Diabetes Mother   . Breast cancer Mother   . Heart attack Father   . Prostate cancer Neg Hx   . Kidney cancer Neg Hx   . Bladder Cancer Neg Hx     Social History:  reports that he has never smoked. He has quit using smokeless tobacco. He reports that he does not drink alcohol or use drugs.  ROS: UROLOGY Frequent Urination?: Yes Hard to postpone urination?: Yes Burning/pain with urination?: Yes Get up at night to urinate?: Yes Leakage of urine?: Yes Urine stream starts and stops?: Yes Trouble starting stream?: Yes Do you have to strain to urinate?: Yes Blood in urine?: No Urinary tract infection?: Yes Sexually transmitted disease?: No Injury to kidneys or bladder?: No Painful intercourse?: No Weak stream?: Yes Erection problems?: No Penile pain?: No  Gastrointestinal Nausea?: No Vomiting?: No Indigestion/heartburn?: No Diarrhea?: No Constipation?: No  Constitutional Fever: No Night sweats?: No Weight loss?: No Fatigue?: No  Skin Skin rash/lesions?: No Itching?: No  Eyes Blurred vision?: No Double vision?:  No  Ears/Nose/Throat Sore throat?: No Sinus problems?: No  Hematologic/Lymphatic Swollen glands?: No Easy bruising?: No  Cardiovascular Leg swelling?: No Chest pain?: No  Respiratory Cough?: No Shortness of breath?: No  Endocrine  Excessive thirst?: No  Musculoskeletal Back pain?: Yes Joint pain?: No  Neurological Headaches?: Yes Dizziness?: No  Psychologic Depression?: Yes Anxiety?: Yes  Physical Exam: BP 125/66 (BP Location: Left Arm, Patient Position: Sitting, Cuff Size: Normal)   Pulse 85   Ht 5' 11.5" (1.816 m)   Wt 232 lb 12.8 oz (105.6 kg)   BMI 32.02 kg/m   Constitutional: Well nourished. Alert and oriented, No acute distress. HEENT: [place] AT, moist mucus membranes. Trachea midline, no masses. Cardiovascular: No clubbing, cyanosis, or edema. Respiratory: Normal respiratory effort, no increased work of breathing. GI: Abdomen is soft, non tender, non distended, no abdominal masses. Liver and spleen not palpable.  No hernias appreciated.  Stool sample for occult testing is not indicated.   GU: No CVA tenderness.  No bladder fullness or masses.  Patient with circumcised phallus.  Urethral meatus is patent.  No penile discharge. No penile lesions or rashes. Scrotum without lesions, cysts, rashes and/or edema.  Testicles are located scrotally bilaterally. No masses are appreciated in the testicles. Left and right epididymis are normal. Rectal: Patient with  normal sphincter tone. Anus and perineum without scarring or rashes. No rectal masses are appreciated. Prostate is approximately 50 grams, no nodules are appreciated. Seminal vesicles are normal. Skin: No rashes, bruises or suspicious lesions. Lymph: No cervical or inguinal adenopathy. Neurologic: Grossly intact, no focal deficits, moving all 4 extremities. Psychiatric: Normal mood and affect.  Laboratory Data: Lab Results  Component Value Date   WBC 4.3 09/25/2016   HGB 13.7 09/25/2016   HCT 39.9 (L)  09/25/2016   MCV 91.9 09/25/2016   PLT 168 09/25/2016    Lab Results  Component Value Date   CREATININE 0.82 09/25/2016    Lab Results  Component Value Date   PSA 12.60 (H) 09/24/2016    Lab Results  Component Value Date   HGBA1C 5.9 (H) 09/24/2016       Component Value Date/Time   CHOL 168 01/27/2014 0013   HDL 45 01/27/2014 0013   CHOLHDL 5.9 CALC 01/12/2008 0859   VLDL 12 01/27/2014 0013   LDLCALC 111 (H) 01/27/2014 0013    Lab Results  Component Value Date   AST 46 (H) 09/22/2016   Lab Results  Component Value Date   ALT 39 09/22/2016     Urinalysis Unremarkable.  See EPIC.    Pertinent Imaging: Results for JEFFERIE, HOLSTON (MRN [id number]) as of 10/09/2016 14:17  Ref. Range 10/09/2016 13:36  Scan Result Unknown 105    Assessment & Plan:    1. UTI with sepsis  - resolved  - UA is clear today  - Urinalysis, Complete  - Bladder Scan (Post Void Residual) in office  2. BPH with LUTS  - IPSS score is 23/6  - Continue conservative management, avoiding bladder irritants and timed voiding's  - Initiate alpha-blocker (tamsulosin 0.4 mg daily), discussed side effects  - RTC in 1 months for IPSS, PSA and PVR.  3. Erectile dysfunction  - SHIM score is 12  - I explained to the patient that in order to achieve an erection it takes good functioning of the nervous system (parasympathetic, sympathetic, sensory and motor), good blood flow into the erectile tissue of the penis and a desire to have sex  - I explained that conditions like diabetes, hypertension, coronary artery disease, peripheral vascular disease, smoking, alcohol consumption, age, sleep apnea and BPH can diminish the ability to have an erection  - RTC in one month for repeat  SHIM score and exam   4. Ejaculatory disorder  - explained to the patient about the effects of aging and enlarging prostate on ejaculation  - explained to the patient that tamsulosin will worsen this, but he needs to be on the  medication due to his history of UTI with sepsis that may have been caused by incomplete bladder emptying  5. Hypersexual  - patient's girlfriend states that the patient is obsessed with sex as he speaks about sex, pressures her for sex and will have sex with her for hours until she is uncomfortable in an effort to have sex  - he perseverated on sex during our office visit  - he will speak with his psychiatrist about this issue  6. Elevated PSA  - PSA was obtained during an infectious process  - will recheck in one month   Return in about 1 month (around 11/06/2016) for IPSS, PSA and PVR.  These notes generated with voice recognition software. I apologize for typographical errors.  Michiel Cowboy, PA-C  [facility] [address], [address] [place], [place] [address] [phone]

## 2016-10-24 ENCOUNTER — Telehealth: Payer: Self-pay | Admitting: Urology

## 2016-10-24 NOTE — Telephone Encounter (Signed)
Pt stopped by office and wanted you to make referral for him to therapist.  He would like for us to give him a call.

## 2016-10-25 NOTE — Telephone Encounter (Signed)
I spoke with a therapist concerning his condition.  They stated that he needed to see his psychiatrist and get his bipolar disease stabilized prior to seeing a therapist.

## 2016-10-28 ENCOUNTER — Telehealth: Payer: Self-pay

## 2016-10-28 NOTE — Telephone Encounter (Signed)
Opened in error

## 2016-10-28 NOTE — Telephone Encounter (Signed)
LMOM

## 2016-10-30 NOTE — Telephone Encounter (Signed)
LMOM

## 2016-10-30 NOTE — Telephone Encounter (Signed)
Spoke with pt in reference Micheal Gay speaking with a therapist. Pt stated that he is seeing Dr. Elesa MassedWard for his bipolar disease. Pt stated that he does not take his bipolar medication daily as prescribed. Reinforced with pt should be ok medication for several consecutive months prior to seeing a therapist. Pt voiced understanding.

## 2017-01-18 ENCOUNTER — Emergency Department: Payer: BLUE CROSS/BLUE SHIELD

## 2017-01-18 ENCOUNTER — Emergency Department
Admission: EM | Admit: 2017-01-18 | Discharge: 2017-01-18 | Disposition: A | Discharge status: Home / Self Care | Payer: BLUE CROSS/BLUE SHIELD | Attending: Emergency Medicine | Admitting: Emergency Medicine

## 2017-01-18 DIAGNOSIS — R0789 Other chest pain: Secondary | ICD-10-CM | POA: Diagnosis present

## 2017-01-18 DIAGNOSIS — T6701XA Heatstroke and sunstroke, initial encounter: Secondary | ICD-10-CM

## 2017-01-18 DIAGNOSIS — Z87891 Personal history of nicotine dependence: Secondary | ICD-10-CM | POA: Diagnosis not present

## 2017-01-18 DIAGNOSIS — T673XXA Heat exhaustion, anhydrotic, initial encounter: Secondary | ICD-10-CM | POA: Insufficient documentation

## 2017-01-18 DIAGNOSIS — Z79899 Other long term (current) drug therapy: Secondary | ICD-10-CM | POA: Insufficient documentation

## 2017-01-18 LAB — CK: Total CK: 754 U/L — ABNORMAL HIGH (ref 49–397)

## 2017-01-18 LAB — CBC
HCT: 44.8 % (ref 40.0–52.0)
HEMOGLOBIN: 15.3 g/dL (ref 13.0–18.0)
MCH: 30.7 pg (ref 26.0–34.0)
MCHC: 34.2 g/dL (ref 32.0–36.0)
MCV: 89.7 fL (ref 80.0–100.0)
Platelets: 224 10*3/uL (ref 150–440)
RBC: 4.99 MIL/uL (ref 4.40–5.90)
RDW: 13.1 % (ref 11.5–14.5)
WBC: 5.4 10*3/uL (ref 3.8–10.6)

## 2017-01-18 LAB — BASIC METABOLIC PANEL
Anion gap: 7 (ref 5–15)
BUN: 17 mg/dL (ref 6–20)
CHLORIDE: 101 mmol/L (ref 101–111)
CO2: 28 mmol/L (ref 22–32)
Calcium: 8.7 mg/dL — ABNORMAL LOW (ref 8.9–10.3)
Creatinine, Ser: 0.91 mg/dL (ref 0.61–1.24)
GFR calc non Af Amer: >60 mL/min (ref >60)
Glucose, Bld: 147 mg/dL — ABNORMAL HIGH (ref 65–99)
Potassium: 3.6 mmol/L (ref 3.5–5.1)
Sodium: 136 mmol/L (ref 135–145)

## 2017-01-18 LAB — TROPONIN I
Troponin I: <0.03 ng/mL (ref <0.03)
Troponin I: <0.03 ng/mL (ref <0.03)

## 2017-01-18 MED ORDER — SODIUM CHLORIDE 0.9 % IV BOLUS (SEPSIS)
1000.0000 mL | Freq: Once | INTRAVENOUS | Status: AC
  Administered 2017-01-18: 1000 mL via INTRAVENOUS

## 2017-01-18 NOTE — ED Notes (Signed)

## 2017-01-18 NOTE — ED Provider Notes (Signed)
[facility] Emergency Department Provider Note    First MD Initiated Contact with Patient 01/18/17 1507     (approximate)  I have reviewed the triage vital signs and the nursing notes.   HISTORY  Chief Complaint Chest Pain   HPI Micheal Gay is a 51 y.o. male with below list of chronic medical conditions presents to the emergency department withacute onset of bilateral lower extremity weakness and confusion burning chest discomfort while at work today at [employer] in the [employer]. Patient states that it was very hot in the area at the time. Patient states current pain score is 4-5 out of 10 and described as burning. Patient denies any shortness of breath. Patient states symptoms have improved since arrival to the emergency department. Patient states that his father died of a myocardial infarction at the age of 49. Patient denies any personal known history of coronary artery disease. Patient denies any history of DVT or PE. Patient denies any lower extremity pain or swelling.   Past Medical History:  Diagnosis Date  . Bipolar 1 disorder (HCC)   . Depression   . Keratoconus of right eye     Patient Active Problem List   Diagnosis Date Noted  . Sepsis (HCC) 09/22/2016  . UTI (urinary tract infection) 09/22/2016  . Depression 09/22/2016  . Bipolar 1 disorder (HCC) 09/22/2016  . HYPERLIPIDEMIA 01/12/2008    Past Surgical History:  Procedure Laterality Date  . EYE SURGERY      Prior to Admission medications   Medication Sig Start Date End Date Taking? Authorizing Provider  cephALEXin (KEFLEX) 250 MG capsule Take 1 capsule (250 mg total) by mouth 3 (three) times daily. X 5 more days Patient not taking: Reported on 10/09/2016 09/26/16   Enid Baas, MD  escitalopram (LEXAPRO) 10 MG tablet Take 10 mg by mouth daily. 08/26/16   [provider]  Influenza Vac Types A & B PF (FLUVIRIN PRESERVATIVE FREE) 0.5 ML SUSY  06/19/13   [provider]  risperiDONE (RISPERDAL) 0.5 MG tablet Take 0.5 mg by mouth daily. 09/17/16   [provider]  senna-docusate (SENOKOT-S) 8.6-50 MG tablet Take 2 tablets by mouth at bedtime. Patient not taking: Reported on 10/09/2016 09/26/16   Enid Baas, MD  tamsulosin (FLOMAX) 0.4 MG CAPS capsule Take 1 capsule (0.4 mg total) by mouth daily. 10/09/16   Michiel Cowboy A, PA-C    Allergies Other and Pollen extract  Family History  Problem Relation Age of Onset  . Diabetes Mother   . Breast cancer Mother   . Heart attack Father   . Prostate cancer Neg Hx   . Kidney cancer Neg Hx   . Bladder Cancer Neg Hx     Social History Social History  Substance Use Topics  . Smoking status: Never Smoker  . Smokeless tobacco: Former Neurosurgeon  . Alcohol use No    Review of Systems Constitutional: No fever/chills Eyes: No visual changes. ENT: No sore throat. Cardiovascular: Positive for chest pain. Respiratory: Denies shortness of breath. Gastrointestinal: No abdominal pain.  No nausea, no vomiting.  No diarrhea.  No constipation. Genitourinary: Negative for dysuria. Musculoskeletal: Negative for back pain. Integumentary: Negative for rash. Neurological: Negative for headaches, focal weakness or numbness. Positive for lower extremity weakness and numbness bilaterally.  ____________________________________________   PHYSICAL EXAM:  VITAL SIGNS: ED Triage Vitals  Enc Vitals Group     BP 01/18/17 1444 114/70     Pulse Rate 01/18/17 1440  90     Resp 01/18/17 1440 11     Temp --      Temp src --      SpO2 01/18/17 1440 96 %     Weight 01/18/17 1441 230 lb (104.3 kg)     Height 01/18/17 1441 5\' 11"  (1.803 m)     Head Circumference --      Peak Flow --      Pain Score 01/18/17 1439 9     Pain Loc --      Pain Edu? --      Excl. in GC? --     Constitutional: Alert and oriented. Well appearing and in no acute distress. Eyes: Conjunctivae are normal. PERRL.  EOMI. Head: Atraumatic. Mouth/Throat: Mucous membranes are moist. Oropharynx non-erythematous. Neck: No stridor.  Cardiovascular: Normal rate, regular rhythm. Good peripheral circulation. Grossly normal heart sounds. Respiratory: Normal respiratory effort.  No retractions. Lungs CTAB. Gastrointestinal: Soft and nontender. No distention.  Musculoskeletal: No lower extremity tenderness nor edema. No gross deformities of extremities. Neurologic:  Normal speech and language. No gross focal neurologic deficits are appreciated.  Skin:  Skin is warm, dry and intact. No rash noted. Psychiatric: Mood and affect are normal. Speech and behavior are normal.  ____________________________________________   LABS (all labs ordered are listed, but only abnormal results are displayed)  Labs Reviewed  CBC  BASIC METABOLIC PANEL  TROPONIN I  CK   ____________________________________________  EKG  ED ECG REPORT I, Newberry N Akeya Ryther, the attending physician, personally viewed and interpreted this ECG.   Date: 01/18/2017  EKG Time: 2:41 PM  Rate: 90  Rhythm: Normal sinus rhythm  Axis: Normal  Intervals: Normal  S ST segment: No ST segment elevation or depression.  I, Gravity N Harlow Carrizales, personally viewed and evaluated these images (plain radiographs) as part of my medical decision making, as well as reviewing the written report by the radiologist  Dg Chest 2 View  Result Date: 01/18/2017 CLINICAL DATA:  Chest pain and dizziness. EXAM: CHEST  2 VIEW COMPARISON:  September 13, 2016 FINDINGS: The heart size and mediastinal contours are within normal limits. Both lungs are clear. The visualized skeletal structures are unremarkable. IMPRESSION: No active cardiopulmonary disease. Electronically Signed   By: Gerome Sam III M.D   On: 01/18/2017 15:17     Procedures   ____________________________________________   INITIAL IMPRESSION / ASSESSMENT AND PLAN / ED COURSE  Pertinent labs & imaging  results that were available during my care of the patient were reviewed by me and considered in my medical decision making (see chart for details).  51 year old male presenting with acute onset of chest chest tightness dizziness "feeling really hot" while at work at the [employer] today. Following arrival to the emergency department patient states the symptoms have dramatically improved and now resolved following IV hydration of which patient received 3 L IV normal saline. Laboratory data remarkable for a elevated CK of 754. Given history of physical exam concern for possible heat exhaustion. Patient advised to remove himself from the heat periodically into an air-conditioned area.      ____________________________________________  FINAL CLINICAL IMPRESSION(S) / ED DIAGNOSES  HEAT EXHAUSTION  MEDICATIONS GIVEN DURING THIS VISIT:  Medications  sodium chloride 0.9 % bolus 1,000 mL (1,000 mLs Intravenous New Bag/Given 01/18/17 1448)     NEW OUTPATIENT MEDICATIONS STARTED DURING THIS VISIT:  New Prescriptions   No medications on file    Modified Medications   No medications on  file    Discontinued Medications   No medications on file     Note:  This document was prepared using Dragon voice recognition software and may include unintentional dictation errors.    Darci CurrentBrown,  N, MD 01/18/17 1946

## 2017-01-18 NOTE — ED Notes (Addendum)
Dr at bedside.

## 2017-01-18 NOTE — ED Notes (Signed)
Social Work at bedside 

## 2017-01-18 NOTE — Clinical Social Work Note (Signed)
Clinical Social Work Assessment  Patient Details  Name: Micheal SorrowLarry A Allensworth MRN: [id number] Date of Birth: Dec 23, 1965  Date of referral:  01/18/17               Reason for consult:  [employer], Financial Concerns                Permission sought to share information with:    Permission granted to share information::  Yes, Verbal Permission Granted (No need to contact his family)  Name::        Agency::     Relationship::     Contact Information:     Housing/Transportation Living arrangements for the past 2 months:  [employer] [facility] Source of Information:  Patient Patient Interpreter Needed:  None Criminal Activity/Legal Involvement Pertinent to Current Situation/Hospitalization:  No - Comment as needed Significant Relationships:  Dependent Children, Other Family Members Lives with:  Self Do you feel safe going back to the place where you live?  Yes Need for family participation in patient care:  No (Coment)  Care giving concerns: patient was informed he needs to practice self care   Social Worker assessment / plan: LCSW introduced myself to this patient and he agreed to assessment and support. Patient reports he felt like he was having a stroke or heart attack and felt really unwell physically. Patient became very emotional during assessment and was supported by this worker. He reports he works 3 hard- labour intense jobs [employer] full time, [employer] and side jobs with a [employer]. He reported he lives in rooming house and struggles to pay all  his bills. He has 15 month for food stamps and has applied to section 8 housing however reported he cant get the housing help because of bad credit with DSS office as he was unable to pay bills his ex wife left him with. Patient is connected to [facility] and is currently taking Anger management courses. LCSW suggested he ask for further assistance for Credit counseling and budgeting with a Product managerCST worker from  [facility].  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[employer]) Health and safety inspectornsurance information:   (Blue cross blue sheild) PT Recommendations:  Not assessed at this time Information / Referral to community resources:  Outpatient Psychiatric Care (Comment Required) (Patient connected to [facility])  Patient/Family's Response to care: patient understands he is hard on himself and will try better self care practices  Patient/Family's Understanding of and Emotional Response to Diagnosis, Current Treatment, and Prognosis:  He understands uif he does not taking better care( hydration and rest and reduce exhaustion)    Emotional Assessment Appearance:    Attitude/Demeanor/Rapport:  Other (Polite, honest and soft spoken) Affect (typically observed):  Calm, Pleasant, Adaptable Orientation:  Oriented to Self, Oriented to Place, Oriented to  Time, Oriented to Situation Alcohol / Substance use:  Not Applicable Psych involvement (Current and /or in the community):  Yes (Comment) ([facility] for anger management classes)  Discharge Needs  Concerns to be addressed:  Basic Needs, Decision making concerns, Coping/Stress Concerns, Financial / Insurance Concerns Readmission within the last 30 days:  No Current discharge risk:  None Barriers to Discharge:  No Barriers Identified   Cheron SchaumannBandi, Shloima Clinch M, LCSW 01/18/2017, 4:05 PM

## 2017-01-18 NOTE — ED Triage Notes (Signed)
Per EMS was at work at [employer].  He had chest pain and dizziness.  EKG unremarkable.  324 of Asprin in the field, 2 sprays of Nitro in the field.  Pain went from 9 to 3.

## 2018-01-01 IMAGING — CR DG CHEST 2V
2 series · 2 of 2 positions shown · non-contrast
Comparison: September 13, 2016

CLINICAL DATA: Chest pain and dizziness.

EXAM:
CHEST  2 VIEW

[chest pa]
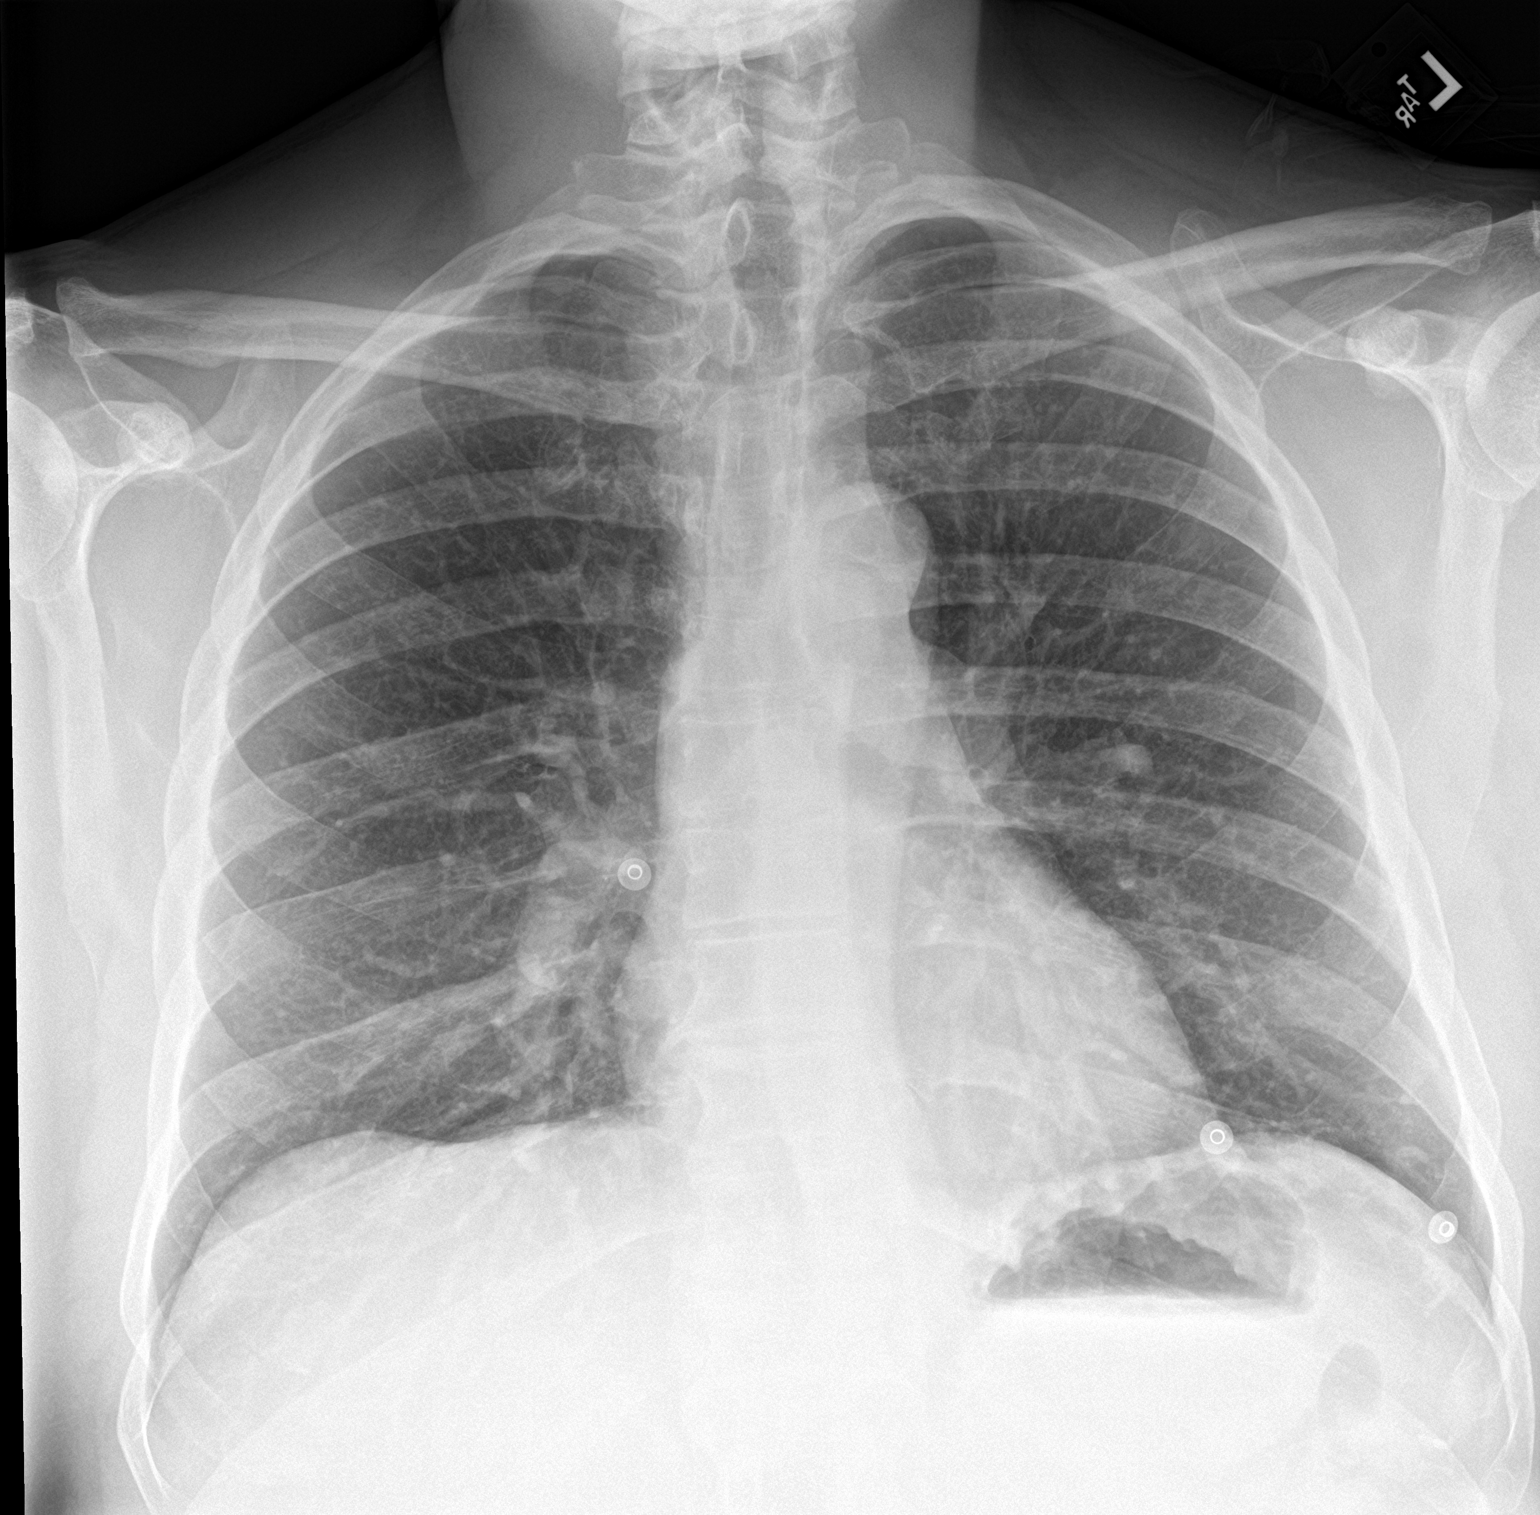

[chest lat]
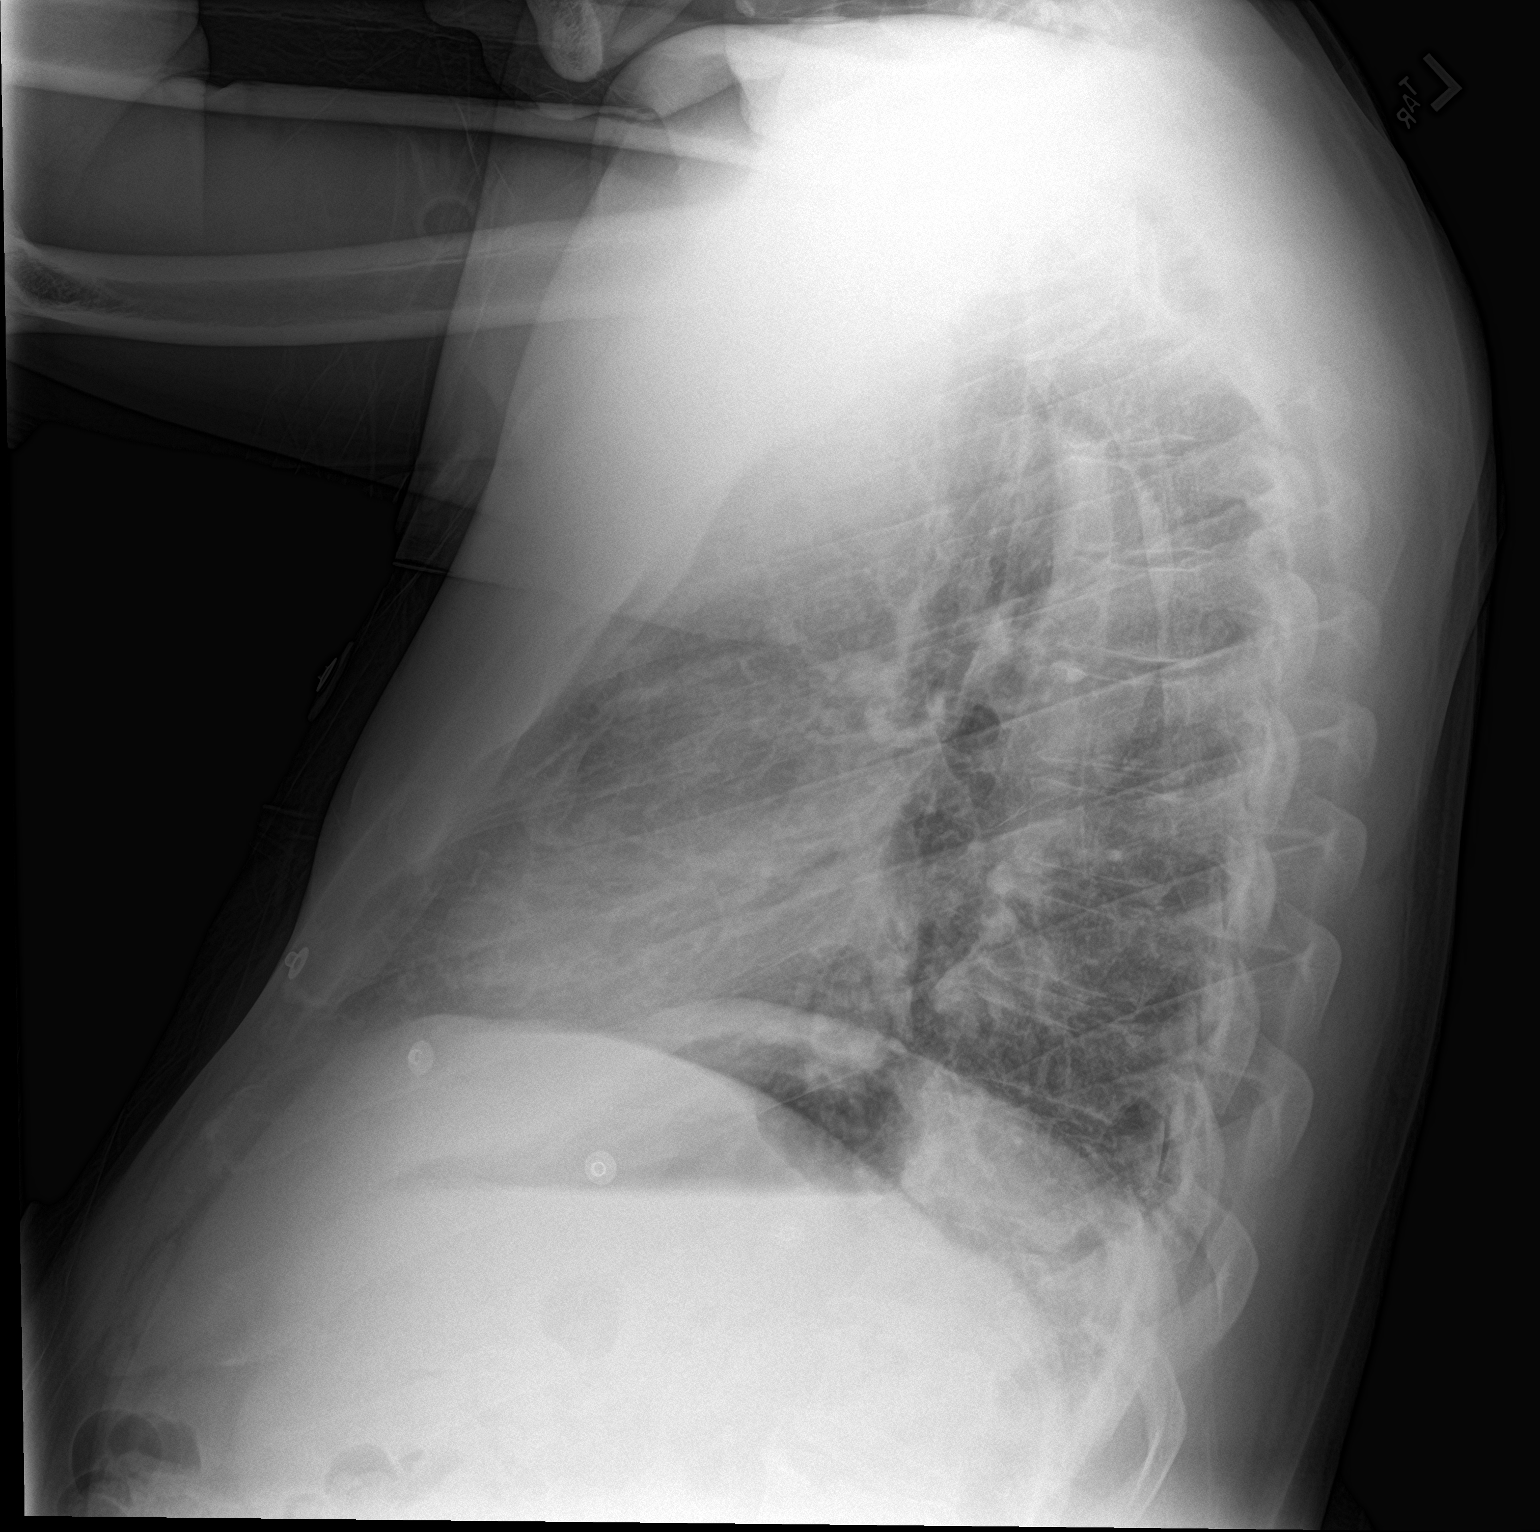

[2 of 2 positions shown; findings below may reference images not displayed]

FINDINGS: The heart size and mediastinal contours are within normal limits.
Both lungs are clear. The visualized skeletal structures are
unremarkable.
IMPRESSION: No active cardiopulmonary disease.

## 2018-06-06 ENCOUNTER — Other Ambulatory Visit: Payer: Self-pay

## 2018-06-06 ENCOUNTER — Encounter: Payer: Self-pay | Admitting: Emergency Medicine

## 2018-06-06 ENCOUNTER — Emergency Department
Admission: EM | Admit: 2018-06-06 | Discharge: 2018-06-06 | Disposition: A | Discharge status: Home / Self Care | Payer: BLUE CROSS/BLUE SHIELD | Attending: Student in an Organized Health Care Education/Training Program | Admitting: Student in an Organized Health Care Education/Training Program

## 2018-06-06 DIAGNOSIS — N410 Acute prostatitis: Secondary | ICD-10-CM | POA: Diagnosis not present

## 2018-06-06 DIAGNOSIS — Z87891 Personal history of nicotine dependence: Secondary | ICD-10-CM | POA: Diagnosis not present

## 2018-06-06 DIAGNOSIS — Z79899 Other long term (current) drug therapy: Secondary | ICD-10-CM | POA: Diagnosis not present

## 2018-06-06 DIAGNOSIS — M7918 Myalgia, other site: Secondary | ICD-10-CM | POA: Diagnosis present

## 2018-06-06 LAB — URINALYSIS, COMPLETE (UACMP) WITH MICROSCOPIC
BACTERIA UA: NONE SEEN
BILIRUBIN URINE: NEGATIVE
Glucose, UA: NEGATIVE mg/dL
Hgb urine dipstick: NEGATIVE
KETONES UR: NEGATIVE mg/dL
LEUKOCYTES UA: NEGATIVE
NITRITE: NEGATIVE
Protein, ur: NEGATIVE mg/dL
Specific Gravity, Urine: 1.028 (ref 1.005–1.030)
pH: 6 (ref 5.0–8.0)

## 2018-06-06 LAB — CHLAMYDIA/NGC RT PCR (ARMC ONLY)
CHLAMYDIA TR: NOT DETECTED
N GONORRHOEAE: NOT DETECTED

## 2018-06-06 MED ORDER — CIPROFLOXACIN HCL 500 MG PO TABS: 500.0000 mg | ORAL_TABLET | Freq: Two times a day (BID) | ORAL | 0 refills | Status: DC

## 2018-06-06 NOTE — ED Triage Notes (Signed)
Generalized body aches x 2 days. Denies sore throat or cough.

## 2018-06-06 NOTE — Discharge Instructions (Addendum)
Follow-up with your regular doctor if not better in 3 to 5 days.  Take medication as prescribed.  Return emergency department if you are worsening

## 2018-06-06 NOTE — ED Notes (Signed)
Pt has  Body aches rash and a  Knot  In his peroneal area  Pt had a  Bump  About 1 week  Ago   -  He squeezed a  Bump  And pus  Came  Out pt reports it burns when he urinates

## 2018-06-06 NOTE — ED Provider Notes (Signed)
[facility] Emergency Department Provider Note  ____________________________________________   First MD Initiated Contact with Patient 06/06/18 1808     (approximate)  I have reviewed the triage vital signs and the nursing notes.   HISTORY  Chief Complaint Generalized Body Aches    HPI Micheal Gay is a 52 y.o. male presents emergency department complaining of body aches, a bump on his scrotum, and concerns of herpes.  He states that the bump on the scrotum had pus in it when he squeezed it out.  He is concerned because he gave a girl that he went out to eat with a hog and she told him she had herpes.  He denies any sexual contact with her.  He denies any unprotected sex in the last 4 months.  He does have some burning with urination.  He states he has a history of prostatitis.  He thinks that if it is not from the bump he thinks he has a prostate infection.    Past Medical History:  Diagnosis Date  . Bipolar 1 disorder (HCC)   . Depression   . Keratoconus of right eye     Patient Active Problem List   Diagnosis Date Noted  . Sepsis (HCC) 09/22/2016  . UTI (urinary tract infection) 09/22/2016  . Depression 09/22/2016  . Bipolar 1 disorder (HCC) 09/22/2016  . HYPERLIPIDEMIA 01/12/2008    Past Surgical History:  Procedure Laterality Date  . EYE SURGERY      Prior to Admission medications   Medication Sig Start Date End Date Taking? Authorizing Provider  ciprofloxacin (CIPRO) 500 MG tablet Take 1 tablet (500 mg total) by mouth 2 (two) times daily. 06/06/18   Ashon Rosenberg, Roselyn Bering, PA-C  escitalopram (LEXAPRO) 10 MG tablet Take 10 mg by mouth daily. 08/26/16   [provider]  Influenza Vac Types A & B PF (FLUVIRIN PRESERVATIVE FREE) 0.5 ML SUSY  06/19/13   [provider]  risperiDONE (RISPERDAL) 0.5 MG tablet Take 0.5 mg by mouth daily. 09/17/16   [provider]  senna-docusate (SENOKOT-S) 8.6-50 MG tablet Take 2 tablets  by mouth at bedtime. Patient not taking: Reported on 10/09/2016 09/26/16   Enid Baas, MD  tamsulosin (FLOMAX) 0.4 MG CAPS capsule Take 1 capsule (0.4 mg total) by mouth daily. 10/09/16   Michiel Cowboy A, PA-C    Allergies Other and Pollen extract  Family History  Problem Relation Age of Onset  . Diabetes Mother   . Breast cancer Mother   . Heart attack Father   . Prostate cancer Neg Hx   . Kidney cancer Neg Hx   . Bladder Cancer Neg Hx     Social History Social History   Tobacco Use  . Smoking status: Never Smoker  . Smokeless tobacco: Former Engineer, water Use Topics  . Alcohol use: No  . Drug use: No    Review of Systems  Constitutional: No fever/chills, positive body aches Eyes: No visual changes. ENT: No sore throat. Respiratory: Denies cough Genitourinary: Positive for dysuria.  Positive for a bump on the scrotum Musculoskeletal: Negative for back pain. Skin: Negative for rash.    ____________________________________________   PHYSICAL EXAM:  VITAL SIGNS: ED Triage Vitals  Enc Vitals Group     BP 06/06/18 1724 124/78     Pulse Rate 06/06/18 1724 81     Resp 06/06/18 1724 20     Temp 06/06/18 1724 98.3 F (36.8 C)     Temp Source  06/06/18 1724 Oral     SpO2 06/06/18 1724 96 %     Weight 06/06/18 1725 225 lb (102.1 kg)     Height 06/06/18 1725 5\' 11"  (1.803 m)     Head Circumference --      Peak Flow --      Pain Score 06/06/18 1725 10     Pain Loc --      Pain Edu? --      Excl. in GC? --     Constitutional: Alert and oriented. Well appearing and in no acute distress. Eyes: Conjunctivae are normal.  Head: Atraumatic. Nose: No congestion/rhinnorhea. Mouth/Throat: Mucous membranes are moist.   Neck:  supple no lymphadenopathy noted Cardiovascular: Normal rate, regular rhythm. Heart sounds are normal Respiratory: Normal respiratory effort.  No retractions, lungs c t a  Abd: soft nontender bs normal all 4 quad GU: Small ingrown hair  noted on the left side of the scrotum.  No drainage is noted.  No herpetic lesions are noted.  Rectal exam shows enlarged prostate which is smooth.  The area is tender upon exam Musculoskeletal: FROM all extremities, warm and well perfused Neurologic:  Normal speech and language.  Skin:  Skin is warm, dry and intact. No rash noted. Psychiatric: Mood and affect are normal. Speech and behavior are normal.  ____________________________________________   LABS (all labs ordered are listed, but only abnormal results are displayed)  Labs Reviewed  URINALYSIS, COMPLETE (UACMP) WITH MICROSCOPIC - Abnormal; Notable for the following components:      Result Value   Color, Urine YELLOW (*)    APPearance CLEAR (*)    All other components within normal limits  CHLAMYDIA/NGC RT PCR ([facility] ONLY)   ____________________________________________   ____________________________________________  RADIOLOGY    ____________________________________________   PROCEDURES  Procedure(s) performed: No  Procedures    ____________________________________________   INITIAL IMPRESSION / ASSESSMENT AND PLAN / ED COURSE  Pertinent labs & imaging results that were available during my care of the patient were reviewed by me and considered in my medical decision making (see chart for details).   Patient is a 52 year old male presented to the emergency department complaining of body aches, a knot at the inguinal area and a history of prostatitis.  He states the bump was more swollen he squeezed and got pus out of it.  He is concerned that maybe he has herpes because a girl he went out with told him she has herpes and he hugged her.  On physical exam patient appears well.  There is a small ingrown hair bump noted at the inguinal area near.  Positive drainage is noted.  No herpetic lesions are noted.  No penile discharge is noted.  Rectal exam shows enlarged moves prostate.  UA is negative.   GC/chlamydia is negative  Results of the urinalysis were explained to the patient.  GC/chlamydia was not reported upon discharge.  Nursing staff will call him with the results tomorrow.  He was given a prescription for Cipro 500 twice daily for 14 days.  Follow-up with his regular doctor if not better in 3 to 5 days.  Return emergency department if worsening.  He states he understands will comply.  He was discharged in stable condition     As part of my medical decision making, I reviewed the following data within the electronic MEDICAL RECORD NUMBER Nursing notes reviewed and incorporated, Labs reviewed UA is negative, GC/chlamydia test negative, Notes from prior ED visits and [place] Controlled Substance  Database  ____________________________________________   FINAL CLINICAL IMPRESSION(S) / ED DIAGNOSES  Final diagnoses:  Acute prostatitis without hematuria      NEW MEDICATIONS STARTED DURING THIS VISIT:  Discharge Medication List as of 06/06/2018  7:08 PM    START taking these medications   Details  ciprofloxacin (CIPRO) 500 MG tablet Take 1 tablet (500 mg total) by mouth 2 (two) times daily., Starting Sat 06/06/2018, Normal         Note:  This document was prepared using Dragon voice recognition software and may include unintentional dictation errors.    Faythe Ghee, PA-C 06/06/18 2202    Willy Eddy, MD 06/07/18 [place]

## 2019-08-30 ENCOUNTER — Ambulatory Visit: Payer: BLUE CROSS/BLUE SHIELD | Admitting: *Deleted

## 2019-09-07 ENCOUNTER — Other Ambulatory Visit: Payer: Self-pay

## 2019-09-07 ENCOUNTER — Encounter: Payer: BC Managed Care – PPO | Attending: Internal Medicine | Admitting: *Deleted

## 2019-09-07 ENCOUNTER — Encounter: Payer: Self-pay | Admitting: *Deleted

## 2019-09-07 VITALS — BP 112/80 | Ht 71.0 in | Wt 242.9 lb

## 2019-09-07 DIAGNOSIS — E119 Type 2 diabetes mellitus without complications: Secondary | ICD-10-CM | POA: Diagnosis not present

## 2019-09-07 DIAGNOSIS — Z713 Dietary counseling and surveillance: Secondary | ICD-10-CM | POA: Diagnosis present

## 2019-09-07 DIAGNOSIS — Z6833 Body mass index (BMI) 33.0-33.9, adult: Secondary | ICD-10-CM | POA: Diagnosis not present

## 2019-09-07 NOTE — Patient Instructions (Signed)
Check blood sugars 2 x day before breakfast and 2 hrs after supper every day or as needed with sensor Bring blood sugar records to the next class  Exercise: Continue program at gym (spinning, weights) for 45-60  minutes  5  days a week  Eat 3 meals day, 1-2 snacks a day Space meals 4-6 hours apart Wait 2-3 hours between meals and snacks Limit fried foods and desserts/sweets Avoid sugar sweetened drinks (tea)  Return for classes on:

## 2019-09-07 NOTE — Progress Notes (Signed)
Diabetes Self-Management Education  Visit Type: First/Initial  Appt. Start Time: 1335 Appt. End Time: 1435  09/07/2019  [name], identified by name and date of birth, is a 53 y.o. male with a diagnosis of Diabetes: Type 2.   ASSESSMENT  Blood pressure 112/80, height 5\' 11"  (1.803 m), weight 242 lb 14.4 oz (110.2 kg). Body mass index is 33.88 kg/m.  Diabetes Self-Management Education - 09/07/19 1528      Visit Information   Visit Type  First/Initial      Initial Visit   Diabetes Type  Type 2    Are you currently following a meal plan?  No    Are you taking your medications as prescribed?  Yes    Date Diagnosed  Oct 2020      Health Coping   How would you rate your overall health?  Good      Psychosocial Assessment   Patient Belief/Attitude about Diabetes  Other (comment)   "tired"   Self-care barriers  None    Self-management support  Doctor's office;Family    Patient Concerns  Nutrition/Meal planning;Glycemic Control;Weight Control;Healthy Lifestyle    Special Needs  None    Preferred Learning Style  Auditory;Visual    Learning Readiness  Ready    How often do you need to have someone help you when you read instructions, pamphlets, or other written materials from your doctor or pharmacy?  1 - Never    What is the last grade level you completed in school?  12th      Pre-Education Assessment   Patient understands the diabetes disease and treatment process.  Needs Instruction    Patient understands incorporating nutritional management into lifestyle.  Needs Instruction    Patient undertands incorporating physical activity into lifestyle.  Needs Review    Patient understands using medications safely.  Needs Instruction    Patient understands monitoring blood glucose, interpreting and using results  Needs Review    Patient understands prevention, detection, and treatment of acute complications.  Needs Instruction    Patient understands prevention, detection, and  treatment of chronic complications.  Needs Instruction    Patient understands how to develop strategies to address psychosocial issues.  Needs Instruction    Patient understands how to develop strategies to promote health/change behavior.  Needs Instruction      Complications   Last HgB A1C per patient/outside source  9.3 %   07/06/2019   How often do you check your blood sugar?  0 times/day (not testing)   Pt has sensor and glucometer. He reports sensor came off last week and he hasn't reinserted it. BG in the office today was 158 mg/dL at 07/08/2019 pm - 1 1/2 hrs pp.   Fasting Blood glucose range (mg/dL)  3:71   Pt reports FBG's 120-150's mg/dL prior to last week.   Postprandial Blood glucose range (mg/dL)  06-269;485-462   He reports some post meal readings have been over 200 mg/dL prior to last week.   Have you had a dilated eye exam in the past 12 months?  Yes    Have you had a dental exam in the past 12 months?  No    Are you checking your feet?  Yes    How many days per week are you checking your feet?  2      Dietary Intake   Breakfast  pt reports that he sometimes skips breakfast and eats out for most lunch and supper meals - herbal life  shake; [place] bacon or sausage, grits, oatmeal, toast, eggs, cereal and milk    Snack (morning)  2-3 snacks per day - cookies, candy bars, protein bar    Lunch  burger with baked potato; chili    Dinner  pt reports stove not working - eats out and likes spaghetti, burgers, grilled chicken sandwich, subway, grilled chicken salad, green beans, spinach, cabbage, broccoli, zucchini, squash    Beverage(s)  waer, milk, sugar sweetened tea      Exercise   Exercise Type  Moderate (swimming / aerobic walking)    How many days per week to you exercise?  5    How many minutes per day do you exercise?  50    Total minutes per week of exercise  250      Patient Education   Previous Diabetes Education  No    Disease state   Definition of diabetes, type 1 and  2, and the diagnosis of diabetes;Factors that contribute to the development of diabetes    Nutrition management   Role of diet in the treatment of diabetes and the relationship between the three main macronutrients and blood glucose level;Food label reading, portion sizes and measuring food.;Reviewed blood glucose goals for pre and post meals and how to evaluate the patients' food intake on their blood glucose level.;Information on hints to eating out and maintain blood glucose control.    Physical activity and exercise   Role of exercise on diabetes management, blood pressure control and cardiac health.    Medications  Reviewed patients medication for diabetes, action, purpose, timing of dose and side effects.    Monitoring  Purpose and frequency of SMBG.;Taught/discussed recording of test results and interpretation of SMBG.;Identified appropriate SMBG and/or A1C goals.    Chronic complications  Relationship between chronic complications and blood glucose control    Psychosocial adjustment  Role of stress on diabetes;Identified and addressed patients feelings and concerns about diabetes      Individualized Goals (developed by patient)   Reducing Risk  Improve blood sugars Lose weight Lead a healthier lifestyle Become more fit     Outcomes   Expected Outcomes  Demonstrated interest in learning. Expect positive outcomes    Future DMSE  2 wks       Individualized Plan for Diabetes Self-Management Training:   Learning Objective:  Patient will have a greater understanding of diabetes self-management. Patient education plan is to attend individual and/or group sessions per assessed needs and concerns.   Plan:   Patient Instructions  Check blood sugars 2 x day before breakfast and 2 hrs after supper every day or as needed with sensor Bring blood sugar records to the next class Exercise: Continue program at gym (spinning, weights) for 45-60  minutes  5  days a week Eat 3 meals day, 1-2 snacks  a day Space meals 4-6 hours apart Wait 2-3 hours between meals and snacks Limit fried foods and desserts/sweets Avoid sugar sweetened drinks (tea)  Expected Outcomes:  Demonstrated interest in learning. Expect positive outcomes  Education material provided:  General Meal Planning Guidelines Simple Meal Plan  If problems or questions, patient to contact team via:  Johny Drilling, RN, [place], CDE [phone]  Future DSME appointment: 2 wks  September 23, 2019 for Diabetes Class 1

## 2019-09-23 ENCOUNTER — Encounter: Payer: BC Managed Care – PPO | Attending: Internal Medicine | Admitting: Dietician

## 2019-09-23 ENCOUNTER — Other Ambulatory Visit: Payer: Self-pay

## 2019-09-23 ENCOUNTER — Encounter: Payer: Self-pay | Admitting: Dietician

## 2019-09-23 VITALS — Ht 71.0 in | Wt 235.7 lb

## 2019-09-23 DIAGNOSIS — E119 Type 2 diabetes mellitus without complications: Secondary | ICD-10-CM | POA: Insufficient documentation

## 2019-09-23 DIAGNOSIS — Z713 Dietary counseling and surveillance: Secondary | ICD-10-CM | POA: Insufficient documentation

## 2019-09-23 DIAGNOSIS — Z6833 Body mass index (BMI) 33.0-33.9, adult: Secondary | ICD-10-CM | POA: Insufficient documentation

## 2019-09-23 NOTE — Progress Notes (Signed)
Appt. Start Time: 0900 Appt. End Time: 1030 Patient left class early due to sleepiness, worked 3rd shift the night before.  Class 1 Diabetes Overview - define DM; state own type of DM; identify functions of pancreas and insulin; define insulin deficiency vs insulin resistance  Psychosocial - identify DM as a source of stress; state the effects of stress on BG control  Exercise - describe the effects of exercise on blood glucose and importance of regular exercise in controlling diabetes; state a plan for personal exercise; verbalize contraindications for exercise  Self-Monitoring - state importance of SMBG; use SMBG results to effectively manage diabetes; identify importance of regular HbA1C testing and goals for results  Acute Complications - recognize hyperglycemia and hypoglycemia with causes and effects; identify blood glucose results as high, low or in control; list steps in treating and preventing high and low blood glucose  Chronic Complications/Foot, Skin, Eye Dental Care - identify possible long-term complications of diabetes (retinopathy, neuropathy, nephropathy, cardiovascular disease, infections); state importance of daily self-foot exams; describe how to examine feet and what to look for; explain appropriate eye and dental care  Lifestyle Changes/Goals & Health/Community Resources - state benefits of making appropriate lifestyle changes; identify habits that need to change (meals, tobacco, alcohol); identify strategies to reduce risk factors for personal health  Pregnancy/Sexual Health - define gestational diabetes; state importance of good blood glucose control and birth control prior to pregnancy; state importance of good blood glucose control in preventing sexual problems (impotence, vaginal dryness, infections, loss of desire); state relationship of blood glucose control and pregnancy outcome; describe risk of maternal and fetal complications  Teaching Materials Used: Class 1  Slides/Notebook Diabetes Booklet ID Card  Medic Alert/Medic ID Forms Sleep Evaluation Exercise Handout Goals for Class 1

## 2019-09-30 ENCOUNTER — Ambulatory Visit: Payer: BC Managed Care – PPO

## 2019-09-30 ENCOUNTER — Telehealth: Payer: Self-pay | Admitting: *Deleted

## 2019-09-30 NOTE — Telephone Encounter (Signed)
Patient didn't show for Diabetes Class 2. Called and left a message for him to come next week for Class 3 and we can reschedule today's class at that time.

## 2019-10-01 ENCOUNTER — Encounter: Payer: Self-pay | Admitting: *Deleted

## 2019-10-01 NOTE — Progress Notes (Signed)
Micheal Gay called back yesterday and reported that he forgot about coming to diabetes class. He was instructed to come next week for Class 3 and he can reschedule Class 2 then. He reported having trouble using his store bought meter. Instructed him to come this morning to be educated on his meter. He didn't show.

## 2019-10-07 ENCOUNTER — Encounter: Payer: Self-pay | Admitting: Dietician

## 2019-10-07 ENCOUNTER — Ambulatory Visit: Payer: BC Managed Care – PPO

## 2019-10-07 NOTE — Progress Notes (Signed)
Patient did not attend diabetes class 3 as scheduled today.

## 2019-10-28 ENCOUNTER — Ambulatory Visit: Payer: BC Managed Care – PPO

## 2019-11-04 ENCOUNTER — Ambulatory Visit: Payer: BC Managed Care – PPO

## 2019-11-04 ENCOUNTER — Encounter: Payer: Self-pay | Admitting: Dietician

## 2019-11-04 NOTE — Progress Notes (Signed)
Patient has not attended diabetes class 3 as scheduled today. This is the fourth consecutively missed class.

## 2019-11-05 ENCOUNTER — Encounter: Payer: Self-pay | Admitting: *Deleted

## 2022-03-22 NOTE — Progress Notes (Signed)
Micheal Gay came to [facility] to inquire about letter he received from [facility] dated 03/15/22 stated hep B testing positive. He had copy of [facility] vaccine record showing received Hep B vaccine on 03/04/22. Advised that this could be a false positive result due to just receiving a vaccine and recommended him come back 25months from when received vaccine to recheck Hep B labs. Advised him to call appt line [facility] end of august to set up an appt to get lab work done. Also provided pt with this nurse's name if any questions. Pt appreciative.

## 2022-08-08 ENCOUNTER — Ambulatory Visit: Payer: BC Managed Care – PPO | Admitting: *Deleted

## 2022-09-23 ENCOUNTER — Ambulatory Visit: Payer: BC Managed Care – PPO | Admitting: *Deleted

## 2022-10-24 ENCOUNTER — Other Ambulatory Visit: Payer: Self-pay | Admitting: Internal Medicine

## 2022-10-25 ENCOUNTER — Ambulatory Visit: Payer: BC Managed Care – PPO | Admitting: Internal Medicine

## 2022-10-25 ENCOUNTER — Encounter: Payer: Self-pay | Admitting: Internal Medicine

## 2022-10-25 VITALS — BP 120/80 | HR 85 | Ht 71.5 in | Wt 213.0 lb

## 2022-10-25 DIAGNOSIS — E119 Type 2 diabetes mellitus without complications: Secondary | ICD-10-CM | POA: Diagnosis not present

## 2022-10-25 DIAGNOSIS — E782 Mixed hyperlipidemia: Secondary | ICD-10-CM | POA: Diagnosis not present

## 2022-10-25 LAB — GLUCOSE, POCT (MANUAL RESULT ENTRY): POC Glucose: 210 mg/dl — AB (ref 70–99)

## 2022-10-25 LAB — FRUCTOSAMINE: Fructosamine: 299 umol/L — ABNORMAL HIGH (ref 0–285)

## 2022-10-25 LAB — POCT UA - MICROALBUMIN
Albumin/Creatinine Ratio, Urine, POC: <30
Creatinine, POC: 10 mg/dL
Microalbumin Ur, POC: 50 mg/L

## 2022-10-25 NOTE — Progress Notes (Signed)
Established Patient Office Visit  Subjective:  Patient ID: Micheal Gay, male    DOB: March 19, 1966  Age: 57 y.o. MRN: [id number]  Chief Complaint  Patient presents with   Follow-up    6 week lab results    No new complaints, here for lab review and medication refills. Labs reviewed and notable for  mildly uncontrolled diabetes with Fructosamine of 299.     Past Medical History:  Diagnosis Date   Bipolar 1 disorder ([place])    Depression    Diabetes mellitus without complication (HCC)    Keratoconus of right eye     Social History   Socioeconomic History   Marital status: Divorced    Spouse name: Not on file   Number of children: Not on file   Years of education: Not on file   Highest education level: Not on file  Occupational History   Not on file  Tobacco Use   Smoking status: Never   Smokeless tobacco: Former    Types: Chew    Quit date: 09/09/1982  Substance and Sexual Activity   Alcohol use: No   Drug use: No   Sexual activity: Not on file  Other Topics Concern   Not on file  Social History Narrative   Not on file   Social Determinants of Health   Financial Resource Strain: Not on file  Food Insecurity: Not on file  Transportation Needs: Not on file  Physical Activity: Not on file  Stress: Not on file  Social Connections: Not on file  Intimate Partner Violence: Not on file    Family History  Problem Relation Age of Onset   Diabetes Mother    Breast cancer Mother    Heart attack Father    Prostate cancer Neg Hx    Kidney cancer Neg Hx    Bladder Cancer Neg Hx     Allergies  Allergen Reactions   Other     Animal Dandruff Hay     Pollen Extract     Review of Systems  Constitutional: Negative.   HENT: Negative.    Eyes: Negative.   Respiratory: Negative.    Cardiovascular: Negative.   Gastrointestinal: Negative.   Genitourinary: Negative.   Skin: Negative.   Neurological: Negative.   Endo/Heme/Allergies: Negative.         Objective:   BP 120/80   Pulse 85   Ht 5' 11.5" (1.816 m)   Wt 213 lb (96.6 kg)   SpO2 98%   BMI 29.29 kg/m   Vitals:   10/25/22 1057  BP: 120/80  Pulse: 85  Height: 5' 11.5" (1.816 m)  Weight: 213 lb (96.6 kg)  SpO2: 98%  BMI (Calculated): 29.3    Physical Exam Vitals reviewed.  Constitutional:      Appearance: Normal appearance.  HENT:     Head: Normocephalic.     Left Ear: There is no impacted cerumen.     Nose: Nose normal.     Mouth/Throat:     Mouth: Mucous membranes are moist.     Pharynx: No posterior oropharyngeal erythema.  Eyes:     Extraocular Movements: Extraocular movements intact.     Pupils: Pupils are equal, round, and reactive to light.  Cardiovascular:     Rate and Rhythm: Regular rhythm.     Chest Wall: PMI is not displaced.     Pulses: Normal pulses.     Heart sounds: Normal heart sounds. No murmur heard. Pulmonary:     Effort:  Pulmonary effort is normal.     Breath sounds: Normal air entry. No rhonchi or rales.  Abdominal:     General: Abdomen is flat. Bowel sounds are normal. There is no distension.     Palpations: Abdomen is soft. There is no hepatomegaly, splenomegaly or mass.     Tenderness: There is no abdominal tenderness.  Musculoskeletal:        General: Normal range of motion.     Cervical back: Normal range of motion and neck supple.     Right lower leg: No edema.     Left lower leg: No edema.  Skin:    General: Skin is warm and dry.  Neurological:     General: No focal deficit present.     Mental Status: He is alert and oriented to person, place, and time.     Cranial Nerves: No cranial nerve deficit.     Motor: No weakness.  Psychiatric:        Mood and Affect: Mood normal.        Behavior: Behavior normal.      Results for orders placed or performed in visit on 10/25/22  POCT Glucose (CBG)  Result Value Ref Range   POC Glucose 210 (A) 70 - 99 mg/dl  POCT Urine microalbumin-creatinine with uACR  Result Value  Ref Range   Microalbumin Ur, POC 50 mg/L   Creatinine, POC 10 mg/dL   Albumin/Creatinine Ratio, Urine, POC <30     Recent Results (from the past 2160 hour(Dorma Altman))  Fructosamine     Status: Abnormal   Collection Time: 10/24/22 10:12 AM  Result Value Ref Range   Fructosamine 299 (H) 0 - 285 umol/L    Comment: Published reference interval for apparently healthy subjects between age 78 and 65 is 40 - 285 umol/L and in a poorly controlled diabetic population is 228 - 563 umol/L with a mean of 396 umol/L.   POCT Glucose (CBG)     Status: Abnormal   Collection Time: 10/25/22 11:08 AM  Result Value Ref Range   POC Glucose 210 (A) 70 - 99 mg/dl  POCT Urine microalbumin-creatinine with uACR     Status: Normal   Collection Time: 10/25/22 11:43 AM  Result Value Ref Range   Microalbumin Ur, POC 50 mg/L   Creatinine, POC 10 mg/dL   Albumin/Creatinine Ratio, Urine, POC <30       Assessment & Plan:   Problem List Items Addressed This Visit       Other   Hyperlipidemia   Relevant Medications   atorvastatin (LIPITOR) 20 MG tablet   Other Relevant Orders   Lipid panel   Hepatic function panel   CK (Creatine Kinase)   Other Visit Diagnoses     Type 2 diabetes mellitus without complication, without long-term current use of insulin (HCC)    -  Primary   Relevant Medications   atorvastatin (LIPITOR) 20 MG tablet   metFORMIN (GLUCOPHAGE) 1000 MG tablet   Other Relevant Orders   POCT Glucose (CBG) (Completed)   Hemoglobin A1c   POCT Urine microalbumin-creatinine with uACR (Completed)     1. Type 2 diabetes mellitus without complication, without long-term current use of insulin (HCC) - POCT Glucose (CBG) - Hemoglobin A1c; Future - POCT Urine microalbumin-creatinine with uACR  2. Mixed hyperlipidemia - Lipid panel; Future - Hepatic function panel; Future - CK (Creatine Kinase); Future    Return in about 6 weeks (around 12/06/2022) for lab results.   Total time spent:  20  minutes  Volanda Napoleon, MD  10/25/2022

## 2022-11-18 ENCOUNTER — Other Ambulatory Visit: Payer: Self-pay

## 2022-11-24 ENCOUNTER — Other Ambulatory Visit: Payer: Self-pay | Admitting: Internal Medicine

## 2022-12-01 ENCOUNTER — Emergency Department
Admission: EM | Admit: 2022-12-01 | Discharge: 2022-12-01 | Disposition: A | Discharge status: Home / Self Care | Payer: BC Managed Care – PPO | Attending: Emergency Medicine | Admitting: Emergency Medicine

## 2022-12-01 ENCOUNTER — Encounter: Payer: Self-pay | Admitting: Emergency Medicine

## 2022-12-01 ENCOUNTER — Other Ambulatory Visit: Payer: Self-pay

## 2022-12-01 ENCOUNTER — Emergency Department: Payer: BC Managed Care – PPO

## 2022-12-01 DIAGNOSIS — R519 Headache, unspecified: Secondary | ICD-10-CM | POA: Diagnosis not present

## 2022-12-01 DIAGNOSIS — E119 Type 2 diabetes mellitus without complications: Secondary | ICD-10-CM | POA: Diagnosis not present

## 2022-12-01 DIAGNOSIS — K0889 Other specified disorders of teeth and supporting structures: Secondary | ICD-10-CM | POA: Insufficient documentation

## 2022-12-01 LAB — CBG MONITORING, ED: Glucose-Capillary: 132 mg/dL — ABNORMAL HIGH (ref 70–99)

## 2022-12-01 MED ORDER — CLINDAMYCIN HCL 300 MG PO CAPS: 300.0000 mg | ORAL_CAPSULE | Freq: Three times a day (TID) | ORAL | 0 refills | Status: AC

## 2022-12-01 MED ORDER — KETOROLAC TROMETHAMINE 15 MG/ML IJ SOLN
15.0000 mg | Freq: Once | INTRAMUSCULAR | Status: AC
  Administered 2022-12-01: 15 mg via INTRAVENOUS
  Filled 2022-12-01: qty 1

## 2022-12-01 MED ORDER — BUTALBITAL-APAP-CAFFEINE 50-325-40 MG PO TABS: 1.0000 | ORAL_TABLET | Freq: Four times a day (QID) | ORAL | 0 refills | Status: DC | PRN

## 2022-12-01 NOTE — ED Notes (Signed)
Pt denies other meds. Pt has tears streaming down face and is sucking in neck muscles with respirations as well as moving tongue in and out of mouth rhythmically. Pt states this happened a few days ago (the tongue movements) and it is associated with pain. States worked 12 hours last night and has a lot of pain right now. Voice is clear, not hoarse. Listened with stethoscope over neck and no stridor or abnormal breath sounds heard. Assessment findings conveyed to Letitia Libra PA.

## 2022-12-01 NOTE — ED Provider Notes (Signed)
[facility] Provider Note    None    (approximate)   History   Dental Pain and Headache   HPI  Micheal Gay is a 57 y.o. male   presents to the ED with complaint of right-sided headache off and on for several weeks and also lower anterior dental pain for approximately 1 month.  Patient states that he has been unable to schedule an appointment but reports that he did see a dentist who put him on antibiotics and some pain medication 1 month ago.  Patient denies any fever, chills, nausea or vomiting.  There has been no change in vision, dizziness or gait.  Patient has a history of diabetes without complication, depression, bipolar 1 disorder.      Physical Exam   Triage Vital Signs: ED Triage Vitals [12/01/22 0858]  Enc Vitals Group     BP      Pulse      Resp      Temp      Temp src      SpO2      Weight 206 lb (93.4 kg)     Height 5' 11.5" (1.816 m)     Head Circumference      Peak Flow      Pain Score 10     Pain Loc      Pain Edu?      Excl. in [place]?     Most recent vital signs: Vitals:   12/01/22 0901  BP: (!) 144/87  Pulse: 82  Resp: 20  Temp: 97.9 F (36.6 C)  SpO2: 100%     General: Awake, no distress.  Patient is a poor historian.  It is noted that he has a tick causing him to stick his tongue out frequently and strained his neck muscles in the process.  Patient states that this has been occurring since he had his teeth removed and is not anything new.  No stridor or respiratory difficulty and patient is able to talk in complete sentences. CV:  Good peripheral perfusion.  Resp:  Normal effort.  Clear bilaterally. Abd:  No distention.  Other:  PERRLA, EOMI's, cranial nerves II through XII grossly intact, speech is clear, good muscle strength bilaterally.  Patient is able to stand and ambulate without any assistance.  There is some tenderness and mild edema noted to the gums in the lower anterior portion of his mandible.  No  active drainage.  Patient is edentulous.  Neck is supple without cervical lymphadenopathy.   ED Results / Procedures / Treatments   Labs (all labs ordered are listed, but only abnormal results are displayed) Labs Reviewed  CBG MONITORING, ED - Abnormal; Notable for the following components:      Result Value   Glucose-Capillary 132 (*)    All other components within normal limits       RADIOLOGY CT head and maxillofacial without contrast per radiologist is negative for acute intracranial abnormality, fractures and maxillofacial also noted with no acute abnormalities.    PROCEDURES:  Critical Care performed:   Procedures   MEDICATIONS ORDERED IN ED: Medications  ketorolac (TORADOL) 15 MG/ML injection 15 mg (15 mg Intravenous Given 12/01/22 1120)     IMPRESSION / MDM / ASSESSMENT AND PLAN / ED COURSE  I reviewed the triage vital signs and the nursing notes.   Differential diagnosis includes, but is not limited to, generalized headache, space-occupying lesion causing headache, intracranial bleeding, dental pain, osteomyelitis of the  mandible highly unlikely.  57 year old male presents to the ED with complaint of generalized headache for several weeks without relief with antibiotics and "pain medication" that was given to him 1 month ago.  Patient also reports dental pain which may or may not have anything to do with his current headache.  Patient is very poor historian and is unable to give a history of antibiotics and medication that have been taken over the last 30 days.  He states he has an appointment the first week in April with a dentist for evaluation of his dental pain.  After CT scan was reported as negative patient was given Toradol 15 mg IV and patient was made aware that this would not cause any drowsiness as he is driving.  A prescription for clindamycin 3 times daily for 7 days and Fioricet 1 every 6 hours as needed for headache was sent to the pharmacy and patient  is aware that this medication could cause drowsiness.  He is encouraged to keep his appointment with the dentist and also follow-up with his primary care provider.      Patient's presentation is most consistent with acute illness / injury with system symptoms.  FINAL CLINICAL IMPRESSION(S) / ED DIAGNOSES   Final diagnoses:  Pain, dental  Generalized headache     Rx / DC Orders   ED Discharge Orders          Ordered    clindamycin (CLEOCIN) 300 MG capsule  3 times daily        12/01/22 1121    butalbital-acetaminophen-caffeine (FIORICET) 50-325-40 MG tablet  Every 6 hours PRN        12/01/22 1121             Note:  This document was prepared using Dragon voice recognition software and may include unintentional dictation errors.   Johnn Hai, PA-C 12/01/22 1344    Rada Hay, MD 12/01/22 1539

## 2022-12-01 NOTE — ED Triage Notes (Signed)
Pt via POV from home. Pt c/o R sided headache and lower dental pain, states that he has trying to follow up with dentist but unable to schedule an appt. Pain has been going on a week. Pt tearful during triage. Pt is A&Ox4 and NAD. Ambulatory to triage room.

## 2022-12-01 NOTE — ED Notes (Signed)
Pt calling for pain meds. Informed him that provider was made aware. Pt up to use bathroom. Ambulatory with steady gait.

## 2022-12-01 NOTE — ED Notes (Signed)
This RN misread CT order and placed IV. Will remove when pt d/c.

## 2022-12-01 NOTE — Discharge Instructions (Addendum)
Follow-up with your primary care provider if any continued problems with your headaches.  Begin taking the antibiotic 3 times a day for the next 7 days and keep your appointment with a dentist.  Also a list of dental clinics is on your discharge papers should you need to call and make other appointments.   Do not drive or operate machinery while taking the medication for headache as it could cause drowsiness.     OPTIONS FOR DENTAL FOLLOW UP CARE  [facility] [place] [email or web address] [phone])  [facility] [phone])  [place] [phone])  [place] [phone])  [facility] [phone]) This clinic caters to the indigent population and is on a lottery system. Location: [facility], [place], [place], [place] Clinic Hours: Wednesdays from 6pm - 9pm, patients seen by a lottery system. For dates, call or go to [email or web address] Services: Cleanings, fillings and simple extractions. Payment Options: DENTAL WORK IS FREE OF CHARGE. Bring proof of income or support. Best way to get seen: Arrive at 5:15 pm - this is a lottery, NOT first come/first serve, so arriving earlier will not increase your chances of being seen.     [facility] [phone] Select option 1 for emergencies   Location: [facility], [place], [address] [place] Clinic Hours: No walk-ins accepted - call the day before to schedule an appointment. Check in times are 9:30 am and 1:30 pm. Services: Simple extractions, temporary fillings, pulpectomy/pulp debridement, uncomplicated abscess drainage. Payment Options: PAYMENT IS DUE AT THE TIME OF SERVICE.  Fee is usually $100-200, additional surgical  procedures (e.g. abscess drainage) may be extra. Cash, checks, Visa/MasterCard accepted.  Can file Medicaid if patient is covered for dental - patient should call case worker to check. No discount for [facility] patients. Best way to get seen: MUST call the day before and get onto the schedule. Can usually be seen the next 1-2 days. No walk-ins accepted.     [facility] [phone]   Location: [place], [place] Clinic Hours: M, W, Th, F 8am or 1:30pm, Tues 9a or 1:30 - first come/first served. Services: Simple extractions, temporary fillings, uncomplicated abscess drainage.  You do not need to be an [facility] resident. Payment Options: PAYMENT IS DUE AT THE TIME OF SERVICE. Dental insurance, otherwise sliding scale - bring proof of income or support. Depending on income and treatment needed, cost is usually $50-200. Best way to get seen: Arrive early as it is first come/first served.     [facility] [phone]   Location: [place] [facility] Hours: Mon-Thu 8a-5p Services: Most basic dental services including extractions and fillings. Payment Options: PAYMENT IS DUE AT THE TIME OF SERVICE. Sliding scale, up to 50% off - bring proof if income or support. Medicaid with dental option accepted. Best way to get seen: Call to schedule an appointment, can usually be seen within 2 weeks OR they will try to see walk-ins - show up at [place] or 2p (you may have to wait).     [facility] RESIDENTS ONLY   Location: [facility], [place] [address] [place], [place] [address] Clinic Hours: By appointment only. Monday - Thursday 8am-5pm, Friday 8am-12pm Services: Cleanings, fillings, extractions.  Payment Options: PAYMENT IS DUE AT THE TIME OF SERVICE. Cash, Visa or MasterCard. Sliding scale - $30 minimum per  service. Best way to get seen: Come in to office, complete packet and make an appointment - need proof of income or support monies for each household member and proof of [facility] residence. Usually takes about a month to get in.     [facility] [phone]   Location: [address] [place] Clinic Hours: Walk-in Urgent Care Dental Services are offered Monday-Friday mornings only. The numbers of emergencies accepted daily is limited to the number of providers available. Maximum 15 - Mondays, Wednesdays & Thursdays Maximum 10 - Tuesdays & Fridays Services: You do not need to be a [facility] resident to be seen for a dental emergency. Emergencies are defined as pain, swelling, abnormal bleeding, or dental trauma. Walkins will receive x-rays if needed. NOTE: Dental cleaning is not an emergency. Payment Options: PAYMENT IS DUE AT THE TIME OF SERVICE. Minimum co-pay is $40.00 for uninsured patients. Minimum co-pay is $3.00 for Medicaid with dental coverage. Dental Insurance is accepted and must be presented at time of visit. Medicare does not cover dental. Forms of payment: Cash, credit card, checks. Best way to get seen: If not previously registered with the clinic, walk-in dental registration begins at 7:15 am and is on a first come/first serve basis. If previously registered with the clinic, call to make an appointment.     [facility] [place] ONLY   Location: [address], [place], [place] Clinic Hours: Mon-Thu 10a-2p Services: Extractions only! Payment Options: FREE (donations accepted) - bring proof of income or support Best way to get seen: Call and schedule an appointment OR come at 8am on the 1st Monday of every month (except for holidays) when it is first come/first served.     [facility] [phone]   Location: [place], [place] Clinic Hours: Friday  mornings Services, Payment Options, Best way to get seen: Call for info

## 2022-12-03 ENCOUNTER — Telehealth: Payer: Self-pay

## 2022-12-03 NOTE — Telephone Encounter (Signed)
Pt's wife called and left vm requesting a call back

## 2022-12-09 ENCOUNTER — Ambulatory Visit: Payer: BC Managed Care – PPO | Admitting: Internal Medicine

## 2022-12-21 ENCOUNTER — Emergency Department: Payer: BC Managed Care – PPO

## 2022-12-21 ENCOUNTER — Other Ambulatory Visit: Payer: Self-pay

## 2022-12-21 DIAGNOSIS — R002 Palpitations: Secondary | ICD-10-CM | POA: Insufficient documentation

## 2022-12-21 DIAGNOSIS — Z7984 Long term (current) use of oral hypoglycemic drugs: Secondary | ICD-10-CM | POA: Diagnosis not present

## 2022-12-21 DIAGNOSIS — R0789 Other chest pain: Secondary | ICD-10-CM | POA: Insufficient documentation

## 2022-12-21 DIAGNOSIS — R42 Dizziness and giddiness: Secondary | ICD-10-CM | POA: Diagnosis present

## 2022-12-21 DIAGNOSIS — E119 Type 2 diabetes mellitus without complications: Secondary | ICD-10-CM | POA: Insufficient documentation

## 2022-12-21 LAB — CBC
HCT: 42.8 % (ref 39.0–52.0)
Hemoglobin: 14.1 g/dL (ref 13.0–17.0)
MCH: 30.8 pg (ref 26.0–34.0)
MCHC: 32.9 g/dL (ref 30.0–36.0)
MCV: 93.4 fL (ref 80.0–100.0)
Platelets: 266 10*3/uL (ref 150–400)
RBC: 4.58 MIL/uL (ref 4.22–5.81)
RDW: 12.4 % (ref 11.5–15.5)
WBC: 7.2 10*3/uL (ref 4.0–10.5)
nRBC: 0 % (ref 0.0–0.2)

## 2022-12-21 NOTE — ED Provider Triage Note (Signed)
Emergency Medicine Provider Triage Evaluation Note  Micheal Gay , a 57 y.o. male  was evaluated in triage.  Pt complains of dizziness, palpitations, nausea.  Cute onset occurred while he was at work a couple of hours ago at [employer].  Review of Systems  Positive: Dizziness, palpitations, nausea, shortness of breath Negative: Chest pain, vomiting, recent traumatic injury  Physical Exam  BP (!) 147/87   Pulse 90   Temp 98.5 F (36.9 C) (Oral)   Resp 18   Ht 1.816 m (5' 11.5")   Wt 93 kg   SpO2 99%   BMI 28.20 kg/m  Gen:   Awake, anxious Resp:  Normal effort, mild tachypnea but moving good air and speaking in full sentences MSK:   Moves extremities without difficulty.  No appreciable focal neurological deficits. Other:  Somewhat odd affect, seems quite anxious and nervous.  Medical Decision Making  Medically screening exam initiated at 11:32 PM.  Appropriate orders placed.  XANE MARUSKA was informed that the remainder of the evaluation will be completed by another provider, this initial triage assessment does not replace that evaluation, and the importance of remaining in the ED until their evaluation is complete.  Agree with orders and imaging ordered by triage nurse, also added urine drug screen given the patient's symptoms as well as his degree of apparent anxiety and nervousness; symptoms could represent use of stimulants, cannabinoids, or other potentially mind altering substances.   Loleta Rose, MD 12/21/22 [phone]

## 2022-12-21 NOTE — ED Triage Notes (Signed)
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

## 2022-12-22 ENCOUNTER — Emergency Department
Admission: EM | Admit: 2022-12-22 | Discharge: 2022-12-22 | Disposition: A | Discharge status: Home / Self Care | Payer: BC Managed Care – PPO | Attending: Emergency Medicine | Admitting: Emergency Medicine

## 2022-12-22 ENCOUNTER — Emergency Department: Payer: BC Managed Care – PPO

## 2022-12-22 DIAGNOSIS — R0789 Other chest pain: Secondary | ICD-10-CM

## 2022-12-22 DIAGNOSIS — R42 Dizziness and giddiness: Secondary | ICD-10-CM

## 2022-12-22 DIAGNOSIS — R002 Palpitations: Secondary | ICD-10-CM

## 2022-12-22 LAB — MAGNESIUM: Magnesium: 2 mg/dL (ref 1.7–2.4)

## 2022-12-22 LAB — URINE DRUG SCREEN, QUALITATIVE (ARMC ONLY)
Amphetamines, Ur Screen: NOT DETECTED
Barbiturates, Ur Screen: NOT DETECTED
Benzodiazepine, Ur Scrn: NOT DETECTED
Cannabinoid 50 Ng, Ur ~~LOC~~: NOT DETECTED
Cocaine Metabolite,Ur ~~LOC~~: NOT DETECTED
MDMA (Ecstasy)Ur Screen: NOT DETECTED
Methadone Scn, Ur: NOT DETECTED
Opiate, Ur Screen: NOT DETECTED
Phencyclidine (PCP) Ur S: NOT DETECTED
Tricyclic, Ur Screen: NOT DETECTED

## 2022-12-22 LAB — TROPONIN I (HIGH SENSITIVITY)
Troponin I (High Sensitivity): 4 ng/L (ref <18)
Troponin I (High Sensitivity): 5 ng/L

## 2022-12-22 LAB — BASIC METABOLIC PANEL WITH GFR
Anion gap: 10 (ref 5–15)
BUN: 41 mg/dL — ABNORMAL HIGH (ref 6–20)
CO2: 25 mmol/L (ref 22–32)
Calcium: 8.5 mg/dL — ABNORMAL LOW (ref 8.9–10.3)
Chloride: 94 mmol/L — ABNORMAL LOW (ref 98–111)
Creatinine, Ser: 1.15 mg/dL (ref 0.61–1.24)
GFR, Estimated: >60 mL/min
Glucose, Bld: 281 mg/dL — ABNORMAL HIGH (ref 70–99)
Potassium: 4.2 mmol/L (ref 3.5–5.1)
Sodium: 129 mmol/L — ABNORMAL LOW (ref 135–145)

## 2022-12-22 LAB — TSH: TSH: 1.493 u[IU]/mL (ref 0.350–4.500)

## 2022-12-22 LAB — CBG MONITORING, ED: Glucose-Capillary: 274 mg/dL — ABNORMAL HIGH (ref 70–99)

## 2022-12-22 MED ORDER — ALUM & MAG HYDROXIDE-SIMETH 200-200-20 MG/5ML PO SUSP
30.0000 mL | Freq: Once | ORAL | Status: AC
  Administered 2022-12-22: 30 mL via ORAL
  Filled 2022-12-22: qty 30

## 2022-12-22 MED ORDER — SODIUM CHLORIDE 0.9 % IV BOLUS (SEPSIS)
1000.0000 mL | Freq: Once | INTRAVENOUS | Status: AC
  Administered 2022-12-22: 1000 mL via INTRAVENOUS

## 2022-12-22 MED ORDER — PANTOPRAZOLE SODIUM 40 MG IV SOLR
40.0000 mg | Freq: Once | INTRAVENOUS | Status: AC
  Administered 2022-12-22: 40 mg via INTRAVENOUS
  Filled 2022-12-22: qty 10

## 2022-12-22 NOTE — Discharge Instructions (Addendum)
Your labs and urine were reassuring today.  I recommend close follow-up with your primary care doctor if symptoms continue.

## 2022-12-22 NOTE — ED Provider Notes (Signed)
[facility] Provider Note    Event Date/Time   First MD Initiated Contact with Patient 12/22/22 0250     (approximate)   History   Dizziness   HPI  Micheal Gay is a 57 y.o. male with history of bipolar disorder, diabetes who presents to the emergency department with complaints of feeling dizzy at work tonight.  States he felt lightheaded.  No vertigo.  He was concerned that his blood sugar was low so he ate a cookie but this did not help his symptoms.  States he then drink some water.  He had some burning chest discomfort and palpitations with some shortness of breath.  No vomiting, diarrhea.  States he started hyperventilating and felt like his mouth was going numb.  Denies any other numbness or weakness.  States he is starting to feel little bit better.   History provided by patient.    Past Medical History:  Diagnosis Date   Bipolar 1 disorder    Depression    Diabetes mellitus without complication    Keratoconus of right eye     Past Surgical History:  Procedure Laterality Date   EYE SURGERY      MEDICATIONS:  Prior to Admission medications   Medication Sig Start Date End Date Taking? Authorizing Provider  atorvastatin (LIPITOR) 20 MG tablet Take 20 mg by mouth daily. 09/13/22   [provider]  butalbital-acetaminophen-caffeine (FIORICET) 50-325-40 MG tablet Take 1 tablet by mouth every 6 (six) hours as needed for headache. 12/01/22 12/01/23  Bridget Hartshorn L, PA-C  indomethacin (INDOCIN) 50 MG capsule TAKE 1 CAPSULE BY MOUTH THREE TIMES DAILY AS NEEDED FOR HEADACHE 11/25/22   Sherron Monday, MD  metFORMIN (GLUCOPHAGE) 1000 MG tablet Take 1,000 mg by mouth 2 (two) times daily. 09/20/22   [provider]    Physical Exam   Triage Vital Signs: ED Triage Vitals  Enc Vitals Group     BP 12/21/22 2327 (!) 147/87     Pulse Rate 12/21/22 2327 90     Resp 12/21/22 2327 18     Temp 12/21/22 2327 98.5 F (36.9 C)      Temp Source 12/21/22 2327 Oral     SpO2 12/21/22 2327 99 %     Weight 12/21/22 2328 205 lb 0.4 oz (93 kg)     Height 12/21/22 2328 5' 11.5" (1.816 m)     Head Circumference --      Peak Flow --      Pain Score 12/21/22 2328 0     Pain Loc --      Pain Edu? --      Excl. in [place]? --     Most recent vital signs: Vitals:   12/22/22 0254 12/22/22 0254  BP:    Pulse:    Resp: 18   Temp:  98.6 F (37 C)  SpO2:      CONSTITUTIONAL: Alert, responds appropriately to questions. Well-appearing; well-nourished HEAD: Normocephalic, atraumatic EYES: Conjunctivae clear, pupils appear equal, sclera nonicteric ENT: normal nose; moist mucous membranes NECK: Supple, normal ROM CARD: RRR; S1 and S2 appreciated RESP: Normal chest excursion without splinting or tachypnea; breath sounds clear and equal bilaterally; no wheezes, no rhonchi, no rales, no hypoxia or respiratory distress, speaking full sentences ABD/GI: Non-distended; soft, non-tender, no rebound, no guarding, no peritoneal signs BACK: The back appears normal EXT: Normal ROM in all joints; no deformity noted, no edema, no calf tenderness or calf swelling SKIN: Normal  color for age and race; warm; no rash on exposed skin NEURO: Moves all extremities equally, normal speech, normal sensation diffusely, normal gait, no facial asymmetry PSYCH: The patient's mood and manner are appropriate.   ED Results / Procedures / Treatments   LABS: (all labs ordered are listed, but only abnormal results are displayed) Labs Reviewed  BASIC METABOLIC PANEL - Abnormal; Notable for the following components:      Result Value   Sodium 129 (*)    Chloride 94 (*)    Glucose, Bld 281 (*)    BUN 41 (*)    Calcium 8.5 (*)    All other components within normal limits  CBG MONITORING, ED - Abnormal; Notable for the following components:   Glucose-Capillary 274 (*)    All other components within normal limits  CBC  MAGNESIUM  TSH  URINE DRUG SCREEN,  QUALITATIVE ([facility] ONLY)  TROPONIN I (HIGH SENSITIVITY)  TROPONIN I (HIGH SENSITIVITY)     EKG:  EKG Interpretation  Date/Time:  Saturday December 21 2022 23:30:32 EDT Ventricular Rate:  83 PR Interval:  194 QRS Duration: 90 QT Interval:  334 QTC Calculation: 392 R Axis:   54 Text Interpretation: Normal sinus rhythm Cannot rule out Anterior infarct , age undetermined Abnormal ECG When compared with ECG of 22-Sep-2016 19:27, PREVIOUS ECG IS PRESENT Confirmed by Rochele Raring [phone]) on 12/22/2022 7:50:54 AM         RADIOLOGY: My personal review and interpretation of imaging: CT head unremarkable.  Chest x-ray shows atelectasis but no infiltrate or edema.  I have personally reviewed all radiology reports.   CT HEAD WO CONTRAST  Result Date: 12/22/2022 CLINICAL DATA:  Dizziness, palpitations EXAM: CT HEAD WITHOUT CONTRAST TECHNIQUE: Contiguous axial images were obtained from the base of the skull through the vertex without intravenous contrast. RADIATION DOSE REDUCTION: This exam was performed according to the departmental dose-optimization program which includes automated exposure control, adjustment of the mA and/or kV according to patient size and/or use of iterative reconstruction technique. COMPARISON:  12/01/2022 FINDINGS: Brain: No evidence of acute infarction, hemorrhage, hydrocephalus, extra-axial collection or mass lesion/mass effect. Vascular: No hyperdense vessel or unexpected calcification. Skull: Normal. Negative for fracture or focal lesion. Sinuses/Orbits: The visualized paranasal sinuses are essentially clear. The mastoid air cells are unopacified. Other: None. IMPRESSION: Normal head CT. Electronically Signed   By: Charline Bills M.D.   On: 12/22/2022 00:29   DG Chest 2 View  Result Date: 12/21/2022 CLINICAL DATA:  Palpitations. Sudden onset of dizziness. EXAM: CHEST - 2 VIEW COMPARISON:  01/18/2017 FINDINGS: The heart is normal in size.The cardiomediastinal contours are  normal. Mild lingular and left basilar atelectasis. Pulmonary vasculature is normal. No consolidation, pleural effusion, or pneumothorax. No acute osseous abnormalities are seen. Air-fluid level noted in the stomach. IMPRESSION: Mild lingular and left basilar atelectasis. Electronically Signed   By: Narda Rutherford M.D.   On: 12/21/2022 23:55     PROCEDURES:  Critical Care performed: No     .1-3 Lead EKG Interpretation  Performed by: Tryston Gilliam, Layla Maw, DO Authorized by: Gershon Shorten, Layla Maw, DO     Interpretation: normal     ECG rate:  83   ECG rate assessment: normal     Rhythm: sinus rhythm     Ectopy: none     Conduction: normal       IMPRESSION / MDM / ASSESSMENT AND PLAN / ED COURSE  I reviewed the triage vital signs and the nursing notes.  Patient here with episode of dizziness, hyperventilation, palpitations, numbness around the mouth and burning in the center of his chest.  The patient is on the cardiac monitor to evaluate for evidence of arrhythmia and/or significant heart rate changes.   DIFFERENTIAL DIAGNOSIS (includes but not limited to):   ACS, less likely PE or dissection.  Differential also includes GERD, esophageal spasm, esophagitis, anxiety, anemia, electrolyte derangement, thyroid dysfunction, blood sugar abnormalities hydration, orthostasis.   Patient's presentation is most consistent with acute presentation with potential threat to life or bodily function.   PLAN: Workup initiated from triage.  Patient has a mild hyponatremia.  Also slightly hyperglycemic but not in DKA.  Normal hemoglobin and electrolytes otherwise.  Troponin negative.  Second is pending.  EKG nonischemic.  Normal TSH.  Chest x-ray reviewed and interpreted by myself and radiologist and shows no acute abnormality.  Patient was seen by provider in triage she also ordered a head CT which was reviewed by myself and the radiologist and is unremarkable.  He has no complaints of focal neurologic  deficits and I suspect this episode of feeling like his mouth was not which was on both sides of his face was secondary to hyperventilation possibly from a panic attack.  Will give Mylanta, IV fluids and Protonix to help with his residual chest burning and dizziness.  Patient is able to ambulate here and tolerate p.o.  Anticipate discharge home if repeat troponin is negative.   MEDICATIONS GIVEN IN ED: Medications  sodium chloride 0.9 % bolus 1,000 mL (0 mLs Intravenous Stopped 12/22/22 0426)  alum & mag hydroxide-simeth (MAALOX/MYLANTA) 200-200-20 MG/5ML suspension 30 mL (30 mLs Oral Given 12/22/22 0342)  pantoprazole (PROTONIX) injection 40 mg (40 mg Intravenous Given 12/22/22 0343)     ED COURSE: Second troponin negative.  Patient tolerating p.o.  I feel he is safe for discharge to follow-up with his PCP.  Patient comfortable with this plan.   At this time, I do not feel there is any life-threatening condition present. I reviewed all nursing notes, vitals, pertinent previous records.  All lab and urine results, EKGs, imaging ordered have been independently reviewed and interpreted by myself.  I reviewed all available radiology reports from any imaging ordered this visit.  Based on my assessment, I feel the patient is safe to be discharged home without further emergent workup and can continue workup as an outpatient as needed. Discussed all findings, treatment plan as well as usual and customary return precautions.  They verbalize understanding and are comfortable with this plan.  Outpatient follow-up has been provided as needed.  All questions have been answered.    CONSULTS:  none   OUTSIDE RECORDS REVIEWED: Reviewed last PCP note in March 2024.       FINAL CLINICAL IMPRESSION(S) / ED DIAGNOSES   Final diagnoses:  Dizziness  Palpitations  Atypical chest pain     Rx / DC Orders   ED Discharge Orders     None        Note:  This document was prepared using Dragon voice  recognition software and may include unintentional dictation errors.   Marshelle Bilger, Layla Maw, DO 12/22/22 [phone]

## 2022-12-23 ENCOUNTER — Ambulatory Visit: Payer: BC Managed Care – PPO | Admitting: Family

## 2022-12-23 ENCOUNTER — Telehealth: Payer: Self-pay

## 2022-12-23 ENCOUNTER — Encounter: Payer: Self-pay | Admitting: Family

## 2022-12-23 VITALS — BP 112/62 | HR 83 | Ht 71.5 in | Wt 207.0 lb

## 2022-12-23 DIAGNOSIS — K0889 Other specified disorders of teeth and supporting structures: Secondary | ICD-10-CM

## 2022-12-23 DIAGNOSIS — G44011 Episodic cluster headache, intractable: Secondary | ICD-10-CM

## 2022-12-23 DIAGNOSIS — E1165 Type 2 diabetes mellitus with hyperglycemia: Secondary | ICD-10-CM

## 2022-12-23 DIAGNOSIS — E669 Obesity, unspecified: Secondary | ICD-10-CM | POA: Insufficient documentation

## 2022-12-23 LAB — POCT CBG (FASTING - GLUCOSE)-MANUAL ENTRY: Glucose Fasting, POC: 131 mg/dL — AB (ref 70–99)

## 2022-12-23 MED ORDER — SUMATRIPTAN 5 MG/ACT NA SOLN
1.0000 | Freq: Two times a day (BID) | NASAL | 0 refills | Status: DC | PRN
Start: 2022-12-23 — End: 2023-05-30

## 2022-12-23 MED ORDER — AMOXICILLIN-POT CLAVULANATE 875-125 MG PO TABS
1.0000 | ORAL_TABLET | Freq: Two times a day (BID) | ORAL | 0 refills | Status: DC
Start: 2022-12-23 — End: 2023-05-30

## 2022-12-23 MED ORDER — NURTEC 75 MG PO TBDP
75.0000 mg | ORAL_TABLET | Freq: Every day | ORAL | 1 refills | Status: AC | PRN
Start: 2022-12-23 — End: ?

## 2022-12-23 NOTE — Telephone Encounter (Signed)
Pt called and left vm regarding rx nurtec being over $1000, asked if you can send alternate rx for him? Please advise

## 2022-12-23 NOTE — Progress Notes (Signed)
   Acute Office Visit  Subjective:     Patient ID: Micheal Gay, male    DOB: [birth year], 57 y.o.   MRN: [id number]  Chief Complaint  Patient presents with   Headache    Headache  This is a new problem. The current episode started 1 to 4 weeks ago. The problem has been waxing and waning. The pain is located in the Frontal region. The pain radiates to the face. The pain quality is not similar to prior headaches. The quality of the pain is described as shooting and throbbing. The pain is at a severity of 10/10. The pain is severe. Associated symptoms include eye watering, muscle aches, neck pain, numbness, phonophobia and photophobia. Associated symptoms comments: Facial pain. The symptoms are aggravated by unknown. He has tried NSAIDs and ergotamines for the symptoms. The treatment provided no relief. There is no history of recent head traumas.    Review of Systems  Eyes:  Positive for photophobia.  Musculoskeletal:  Positive for neck pain.  Neurological:  Positive for numbness and headaches.       Objective:    BP 112/62   Pulse 83   Ht 5' 11.5" (1.816 m)   Wt 207 lb (93.9 kg)   SpO2 99%   BMI 28.47 kg/m   Physical Exam  Results for orders placed or performed in visit on 12/23/22  POCT CBG (Fasting - Glucose)  Result Value Ref Range   Glucose Fasting, POC 131 (A) 70 - 99 mg/dL        Assessment & Plan:   Problem List Items Addressed This Visit   None Visit Diagnoses     Intractable episodic cluster headache    -  Primary   Relevant Medications   Rimegepant Sulfate (NURTEC) 75 MG TBDP   SUMAtriptan (IMITREX) 5 MG/ACT nasal spray   Type 2 diabetes mellitus with hyperglycemia, without long-term current use of insulin       Relevant Orders   POCT CBG (Fasting - Glucose) (Completed)   Pain, dental       Relevant Medications   amoxicillin-clavulanate (AUGMENTIN) 875-125 MG tablet        Return as scheduled unless not improving..  Total time spent: 20  minutes  Miki Kins, FNP  12/23/2022

## 2022-12-26 ENCOUNTER — Encounter: Payer: Self-pay | Admitting: Family

## 2022-12-27 ENCOUNTER — Ambulatory Visit: Payer: BC Managed Care – PPO | Admitting: Family

## 2022-12-27 ENCOUNTER — Telehealth: Payer: Self-pay | Admitting: Internal Medicine

## 2022-12-27 VITALS — BP 122/74 | HR 107 | Ht 71.5 in

## 2022-12-27 DIAGNOSIS — G500 Trigeminal neuralgia: Secondary | ICD-10-CM

## 2022-12-27 DIAGNOSIS — E1165 Type 2 diabetes mellitus with hyperglycemia: Secondary | ICD-10-CM

## 2022-12-27 DIAGNOSIS — G44011 Episodic cluster headache, intractable: Secondary | ICD-10-CM | POA: Diagnosis not present

## 2022-12-27 LAB — POCT CBG (FASTING - GLUCOSE)-MANUAL ENTRY: Glucose Fasting, POC: 109 mg/dL — AB (ref 70–99)

## 2022-12-27 MED ORDER — ONDANSETRON 4 MG PO TBDP: 4.0000 mg | ORAL_TABLET | Freq: Three times a day (TID) | ORAL | 0 refills | Status: DC | PRN

## 2022-12-27 MED ORDER — LIDOCAINE VISCOUS HCL 2 % MT SOLN: 15.0000 mL | Freq: Four times a day (QID) | OROMUCOSAL | 0 refills | Status: DC | PRN

## 2022-12-27 NOTE — Telephone Encounter (Signed)
Patient left VM requesting call back.

## 2022-12-28 ENCOUNTER — Encounter: Payer: Self-pay | Admitting: Family

## 2022-12-28 NOTE — Progress Notes (Signed)
Established Patient Office Visit  Subjective:  Patient ID: Micheal Gay, male    DOB: 12/14/1965  Age: 57 y.o. MRN: [id number]  No chief complaint on file.   Patient is here for follow up of headaches from earlier this week.  He has been having enough headaches that he has now been vomiting as well.  The Nurtec has helped, but is not completely getting rid of the headache.   No other concerns at this time.   Past Medical History:  Diagnosis Date   Bipolar 1 disorder    Depression    Diabetes mellitus without complication    Keratoconus of right eye    Sepsis 09/22/2016   Septicemia 09/22/2016    Past Surgical History:  Procedure Laterality Date   EYE SURGERY      Social History   Socioeconomic History   Marital status: Divorced    Spouse name: Not on file   Number of children: Not on file   Years of education: Not on file   Highest education level: Not on file  Occupational History   Not on file  Tobacco Use   Smoking status: Never   Smokeless tobacco: Former    Types: Chew    Quit date: 09/09/1982  Substance and Sexual Activity   Alcohol use: No   Drug use: No   Sexual activity: Not on file  Other Topics Concern   Not on file  Social History Narrative   Not on file   Social Determinants of Health   Financial Resource Strain: Not on file  Food Insecurity: Not on file  Transportation Needs: Not on file  Physical Activity: Not on file  Stress: Not on file  Social Connections: Not on file  Intimate Partner Violence: Not on file    Family History  Problem Relation Age of Onset   Diabetes Mother    Breast cancer Mother    Heart attack Father    Prostate cancer Neg Hx    Kidney cancer Neg Hx    Bladder Cancer Neg Hx     Allergies  Allergen Reactions   Other     Animal Dandruff Hay     Pollen Extract     Review of Systems  Constitutional:  Positive for diaphoresis and malaise/fatigue.  Gastrointestinal:  Positive for nausea and  vomiting.  Neurological:  Positive for dizziness, tingling and headaches.  All other systems reviewed and are negative.      Objective:   BP 122/74   Pulse (!) 107   Ht 5' 11.5" (1.816 m)   SpO2 97%   BMI 28.47 kg/m   Vitals:   12/27/22 1317  BP: 122/74  Pulse: (!) 107  Height: 5' 11.5" (1.816 m)  SpO2: 97%    Physical Exam Vitals and nursing note reviewed.  Constitutional:      Appearance: Normal appearance. He is normal weight.  Eyes:     Extraocular Movements: Extraocular movements intact.     Conjunctiva/sclera: Conjunctivae normal.     Pupils: Pupils are equal, round, and reactive to light.  Cardiovascular:     Rate and Rhythm: Normal rate and regular rhythm.     Pulses: Normal pulses.     Heart sounds: Normal heart sounds.  Pulmonary:     Effort: Pulmonary effort is normal.     Breath sounds: Normal breath sounds.  Neurological:     Mental Status: He is alert and oriented to person, place, and time. Mental status is at  baseline.     Cranial Nerves: Cranial nerve deficit present.     Comments: Twitching of facial muscles.   Psychiatric:        Mood and Affect: Mood is anxious.        Behavior: Behavior normal.        Thought Content: Thought content normal.        Judgment: Judgment normal.      Results for orders placed or performed in visit on 12/27/22  POCT CBG (Fasting - Glucose)  Result Value Ref Range   Glucose Fasting, POC 109 (A) 70 - 99 mg/dL    Recent Results (from the past 2160 hour(s))  Fructosamine     Status: Abnormal   Collection Time: 10/24/22 10:12 AM  Result Value Ref Range   Fructosamine 299 (H) 0 - 285 umol/L    Comment: Published reference interval for apparently healthy subjects between age 59 and 15 is 49 - 285 umol/L and in a poorly controlled diabetic population is 228 - 563 umol/L with a mean of 396 umol/L.   POCT Glucose (CBG)     Status: Abnormal   Collection Time: 10/25/22 11:08 AM  Result Value Ref Range   POC  Glucose 210 (A) 70 - 99 mg/dl  POCT Urine microalbumin-creatinine with uACR     Status: Normal   Collection Time: 10/25/22 11:43 AM  Result Value Ref Range   Microalbumin Ur, POC 50 mg/L   Creatinine, POC 10 mg/dL   Albumin/Creatinine Ratio, Urine, POC <30   CBG monitoring, ED     Status: Abnormal   Collection Time: 12/01/22 10:27 AM  Result Value Ref Range   Glucose-Capillary 132 (H) 70 - 99 mg/dL    Comment: Glucose reference range applies only to samples taken after fasting for at least 8 hours.  Troponin I (High Sensitivity)     Status: None   Collection Time: 12/21/22 11:31 PM  Result Value Ref Range   Troponin I (High Sensitivity) 4 <18 ng/L    Comment: (NOTE) Elevated high sensitivity troponin I (hsTnI) values and significant  changes across serial measurements may suggest ACS but many other  chronic and acute conditions are known to elevate hsTnI results.  Refer to the "Links" section for chest pain algorithms and additional  guidance. Performed at [facility], [address] [place], [place] [address]   Magnesium     Status: None   Collection Time: 12/21/22 11:31 PM  Result Value Ref Range   Magnesium 2.0 1.7 - 2.4 mg/dL    Comment: Performed at [facility], [address] [place], [place] [address]  TSH     Status: None   Collection Time: 12/21/22 11:31 PM  Result Value Ref Range   TSH 1.493 0.350 - 4.500 uIU/mL    Comment: Performed by a 3rd Generation assay with a functional sensitivity of <=0.01 uIU/mL. Performed at [facility], [address] [place], [place] [address]   Basic metabolic panel     Status: Abnormal   Collection Time: 12/21/22 11:32 PM  Result Value Ref Range   Sodium 129 (L) 135 - 145 mmol/L   Potassium 4.2 3.5 - 5.1 mmol/L   Chloride 94 (L) 98 - 111 mmol/L   CO2 25 22 - 32 mmol/L   Glucose, Bld 281 (H) 70 - 99 mg/dL    Comment: Glucose reference range applies only to samples taken after fasting for at least  8 hours.   BUN 41 (H)  6 - 20 mg/dL   Creatinine, Ser 7.82 0.61 - 1.24 mg/dL   Calcium 8.5 (L) 8.9 - 10.3 mg/dL   GFR, Estimated >95 >62 mL/min    Comment: (NOTE) Calculated using the CKD-EPI Creatinine Equation (2021)    Anion gap 10 5 - 15    Comment: Performed at [facility], [address] [place], [place] [address]  CBC     Status: None   Collection Time: 12/21/22 11:32 PM  Result Value Ref Range   WBC 7.2 4.0 - 10.5 K/uL   RBC 4.58 4.22 - 5.81 MIL/uL   Hemoglobin 14.1 13.0 - 17.0 g/dL   HCT 57.8 46.9 - 62.9 %   MCV 93.4 80.0 - 100.0 fL   MCH 30.8 26.0 - 34.0 pg   MCHC 32.9 30.0 - 36.0 g/dL   RDW 52.8 41.3 - 24.4 %   Platelets 266 150 - 400 K/uL   nRBC 0.0 0.0 - 0.2 %    Comment: Performed at [facility], [address] [place], [place] [address]  CBG monitoring, ED     Status: Abnormal   Collection Time: 12/21/22 11:37 PM  Result Value Ref Range   Glucose-Capillary 274 (H) 70 - 99 mg/dL    Comment: Glucose reference range applies only to samples taken after fasting for at least 8 hours.  Urine Drug Screen, Qualitative ([facility] only)     Status: None   Collection Time: 12/22/22  3:03 AM  Result Value Ref Range   Tricyclic, Ur Screen NONE DETECTED NONE DETECTED   Amphetamines, Ur Screen NONE DETECTED NONE DETECTED   MDMA (Ecstasy)Ur Screen NONE DETECTED NONE DETECTED   Cocaine Metabolite,Ur [place] NONE DETECTED NONE DETECTED   Opiate, Ur Screen NONE DETECTED NONE DETECTED   Phencyclidine (PCP) Ur S NONE DETECTED NONE DETECTED   Cannabinoid 50 Ng, Ur  NONE DETECTED NONE DETECTED   Barbiturates, Ur Screen NONE DETECTED NONE DETECTED   Benzodiazepine, Ur Scrn NONE DETECTED NONE DETECTED   Methadone Scn, Ur NONE DETECTED NONE DETECTED    Comment: (NOTE) Tricyclics + metabolites, urine    Cutoff 1000 ng/mL Amphetamines + metabolites, urine  Cutoff 1000 ng/mL MDMA (Ecstasy), urine              Cutoff 500 ng/mL Cocaine Metabolite, urine          Cutoff 300  ng/mL Opiate + metabolites, urine        Cutoff 300 ng/mL Phencyclidine (PCP), urine         Cutoff 25 ng/mL Cannabinoid, urine                 Cutoff 50 ng/mL Barbiturates + metabolites, urine  Cutoff 200 ng/mL Benzodiazepine, urine              Cutoff 200 ng/mL Methadone, urine                   Cutoff 300 ng/mL  The urine drug screen provides only a preliminary, unconfirmed analytical test result and should not be used for non-medical purposes. Clinical consideration and professional judgment should be applied to any positive drug screen result due to possible interfering substances. A more specific alternate chemical method must be used in order to obtain a confirmed analytical result. Gas chromatography / mass spectrometry (GC/MS) is the preferred confirm atory method. Performed at [facility], [address], [place], [place] [address]   Troponin I (High Sensitivity)     Status: None  Collection Time: 12/22/22  3:03 AM  Result Value Ref Range   Troponin I (High Sensitivity) 5 <18 ng/L    Comment: (NOTE) Elevated high sensitivity troponin I (hsTnI) values and significant  changes across serial measurements may suggest ACS but many other  chronic and acute conditions are known to elevate hsTnI results.  Refer to the "Links" section for chest pain algorithms and additional  guidance. Performed at [facility], [address] [place], [place] [address]   POCT CBG (Fasting - Glucose)     Status: Abnormal   Collection Time: 12/23/22  9:58 AM  Result Value Ref Range   Glucose Fasting, POC 131 (A) 70 - 99 mg/dL  POCT CBG (Fasting - Glucose)     Status: Abnormal   Collection Time: 12/27/22  1:21 PM  Result Value Ref Range   Glucose Fasting, POC 109 (A) 70 - 99 mg/dL       Assessment & Plan:   Problem List Items Addressed This Visit   None Visit Diagnoses     Type 2 diabetes mellitus with hyperglycemia, without long-term current use of insulin    -   Primary   Relevant Orders   POCT CBG (Fasting - Glucose) (Completed)   Intractable episodic cluster headache       Trigeminal nerve spasm         Patient given Nurtec and Zavzpret samples today.  Will let me know if these are beneficial.   Continue current other meds  Return in about 1 week (around 01/03/2023).   Total time spent: 30 minutes  Miki Kins, FNP  12/27/2022

## 2022-12-30 ENCOUNTER — Other Ambulatory Visit: Payer: Self-pay

## 2022-12-30 MED ORDER — ZAVZPRET 10 MG/ACT NA SOLN: 1.0000 | Freq: Every day | NASAL | 3 refills | Status: AC

## 2022-12-31 ENCOUNTER — Telehealth: Payer: Self-pay

## 2022-12-31 NOTE — Telephone Encounter (Signed)
Patient Micheal Gay asking for call back 

## 2023-01-01 ENCOUNTER — Encounter: Payer: Self-pay | Admitting: Family

## 2023-01-01 ENCOUNTER — Ambulatory Visit: Payer: BC Managed Care – PPO | Admitting: Family

## 2023-01-01 VITALS — BP 104/76 | HR 84 | Ht 71.5 in | Wt 196.6 lb

## 2023-01-01 DIAGNOSIS — G44011 Episodic cluster headache, intractable: Secondary | ICD-10-CM

## 2023-01-01 DIAGNOSIS — K0889 Other specified disorders of teeth and supporting structures: Secondary | ICD-10-CM

## 2023-01-01 DIAGNOSIS — G500 Trigeminal neuralgia: Secondary | ICD-10-CM | POA: Diagnosis not present

## 2023-01-05 ENCOUNTER — Other Ambulatory Visit: Payer: Self-pay | Admitting: Family

## 2023-01-06 ENCOUNTER — Telehealth: Payer: Self-pay | Admitting: Internal Medicine

## 2023-01-06 NOTE — Telephone Encounter (Signed)
Patient called and [employer] hasn't received the paperwork about him being out of work. He needs this sent in ASAP because they cannot pay him until they receive this. He also said Marchelle Folks was going to write a letter about him and his wife needing help since he is out of work right now - he states he needs two copies of this letter. Please work on these today as it was supposed to be done last Wednesday per patient.

## 2023-01-07 ENCOUNTER — Other Ambulatory Visit: Payer: Self-pay | Admitting: Internal Medicine

## 2023-01-07 MED ORDER — AMITRIPTYLINE HCL 10 MG PO TABS: 10.0000 mg | ORAL_TABLET | Freq: Every day | ORAL | 0 refills | Status: DC

## 2023-01-07 NOTE — Progress Notes (Signed)
Established Patient Office Visit  Subjective:  Patient ID: Micheal Gay, male    DOB: 1966/05/01  Age: 57 y.o. MRN: [id number]  Chief Complaint  Patient presents with   Follow-up    1 week follow up    Patient is here for 1 week follow up.  He has been improving, but he is still having significant headaches in the morning.   He needs his FMLA paperwork completed.  He also needs a letter to see if he can get some support due to not getting paid for being out.   No other concerns at this time.   Past Medical History:  Diagnosis Date   Bipolar 1 disorder (HCC)    Depression    Diabetes mellitus without complication (HCC)    Keratoconus of right eye    Sepsis (HCC) 09/22/2016   Septicemia (HCC) 09/22/2016    Past Surgical History:  Procedure Laterality Date   EYE SURGERY      Social History   Socioeconomic History   Marital status: Divorced    Spouse name: Not on file   Number of children: Not on file   Years of education: Not on file   Highest education level: Not on file  Occupational History   Not on file  Tobacco Use   Smoking status: Never   Smokeless tobacco: Former    Types: Chew    Quit date: 09/09/1982  Substance and Sexual Activity   Alcohol use: No   Drug use: No   Sexual activity: Not on file  Other Topics Concern   Not on file  Social History Narrative   Not on file   Social Determinants of Health   Financial Resource Strain: Not on file  Food Insecurity: Not on file  Transportation Needs: Not on file  Physical Activity: Not on file  Stress: Not on file  Social Connections: Not on file  Intimate Partner Violence: Not on file    Family History  Problem Relation Age of Onset   Diabetes Mother    Breast cancer Mother    Heart attack Father    Prostate cancer Neg Hx    Kidney cancer Neg Hx    Bladder Cancer Neg Hx     Allergies  Allergen Reactions   Other     Animal Dandruff Hay     Pollen Extract     Review of Systems   Constitutional:  Positive for malaise/fatigue.  Eyes:  Positive for photophobia.  Gastrointestinal:  Positive for nausea.  Neurological:  Positive for headaches.       Facial twitch  Psychiatric/Behavioral:  The patient is nervous/anxious.   All other systems reviewed and are negative.      Objective:   BP 104/76   Pulse 84   Ht 5' 11.5" (1.816 m)   Wt 196 lb 9.6 oz (89.2 kg)   SpO2 99%   BMI 27.04 kg/m   Vitals:   01/01/23 1035  BP: 104/76  Pulse: 84  Height: 5' 11.5" (1.816 m)  Weight: 196 lb 9.6 oz (89.2 kg)  SpO2: 99%  BMI (Calculated): 27.04    Physical Exam Vitals and nursing note reviewed.  Constitutional:      Appearance: Normal appearance. He is normal weight.  Eyes:     Pupils: Pupils are equal, round, and reactive to light.  Cardiovascular:     Rate and Rhythm: Normal rate and regular rhythm.     Pulses: Normal pulses.     Heart  sounds: Normal heart sounds.  Pulmonary:     Effort: Pulmonary effort is normal.     Breath sounds: Normal breath sounds.  Neurological:     Mental Status: He is alert and oriented to person, place, and time.     Cranial Nerves: Cranial nerve deficit present.     Sensory: No sensory deficit.     Gait: Gait normal.     Comments: Trigeminal nerve spasm  Psychiatric:        Mood and Affect: Mood normal.        Behavior: Behavior normal.      No results found for any visits on 01/01/23.  Recent Results (from the past 2160 hour(s))  Fructosamine     Status: Abnormal   Collection Time: 10/24/22 10:12 AM  Result Value Ref Range   Fructosamine 299 (H) 0 - 285 umol/L    Comment: Published reference interval for apparently healthy subjects between age 27 and 49 is 29 - 285 umol/L and in a poorly controlled diabetic population is 228 - 563 umol/L with a mean of 396 umol/L.   POCT Glucose (CBG)     Status: Abnormal   Collection Time: 10/25/22 11:08 AM  Result Value Ref Range   POC Glucose 210 (A) 70 - 99 mg/dl  POCT  Urine microalbumin-creatinine with uACR     Status: Normal   Collection Time: 10/25/22 11:43 AM  Result Value Ref Range   Microalbumin Ur, POC 50 mg/L   Creatinine, POC 10 mg/dL   Albumin/Creatinine Ratio, Urine, POC <30   CBG monitoring, ED     Status: Abnormal   Collection Time: 12/01/22 10:27 AM  Result Value Ref Range   Glucose-Capillary 132 (H) 70 - 99 mg/dL    Comment: Glucose reference range applies only to samples taken after fasting for at least 8 hours.  Troponin I (High Sensitivity)     Status: None   Collection Time: 12/21/22 11:31 PM  Result Value Ref Range   Troponin I (High Sensitivity) 4 <18 ng/L    Comment: (NOTE) Elevated high sensitivity troponin I (hsTnI) values and significant  changes across serial measurements may suggest ACS but many other  chronic and acute conditions are known to elevate hsTnI results.  Refer to the "Links" section for chest pain algorithms and additional  guidance. Performed at [facility], [address] [place], [place] [address]   Magnesium     Status: None   Collection Time: 12/21/22 11:31 PM  Result Value Ref Range   Magnesium 2.0 1.7 - 2.4 mg/dL    Comment: Performed at [facility], [address] [place], [place] [address]  TSH     Status: None   Collection Time: 12/21/22 11:31 PM  Result Value Ref Range   TSH 1.493 0.350 - 4.500 uIU/mL    Comment: Performed by a 3rd Generation assay with a functional sensitivity of <=0.01 uIU/mL. Performed at [facility], [address] [place], [place] [address]   Basic metabolic panel     Status: Abnormal   Collection Time: 12/21/22 11:32 PM  Result Value Ref Range   Sodium 129 (L) 135 - 145 mmol/L   Potassium 4.2 3.5 - 5.1 mmol/L   Chloride 94 (L) 98 - 111 mmol/L   CO2 25 22 - 32 mmol/L   Glucose, Bld 281 (H) 70 - 99 mg/dL    Comment: Glucose reference range applies only to samples taken after fasting for at least 8 hours.  BUN 41 (H) 6 - 20 mg/dL    Creatinine, Ser 1.61 0.61 - 1.24 mg/dL   Calcium 8.5 (L) 8.9 - 10.3 mg/dL   GFR, Estimated >09 >60 mL/min    Comment: (NOTE) Calculated using the CKD-EPI Creatinine Equation (2021)    Anion gap 10 5 - 15    Comment: Performed at [facility], [address] [place], [place] [address]  CBC     Status: None   Collection Time: 12/21/22 11:32 PM  Result Value Ref Range   WBC 7.2 4.0 - 10.5 K/uL   RBC 4.58 4.22 - 5.81 MIL/uL   Hemoglobin 14.1 13.0 - 17.0 g/dL   HCT 81.1 91.4 - 78.2 %   MCV 93.4 80.0 - 100.0 fL   MCH 30.8 26.0 - 34.0 pg   MCHC 32.9 30.0 - 36.0 g/dL   RDW 95.6 21.3 - 08.6 %   Platelets 266 150 - 400 K/uL   nRBC 0.0 0.0 - 0.2 %    Comment: Performed at [facility], [address] [place], [place] [address]  CBG monitoring, ED     Status: Abnormal   Collection Time: 12/21/22 11:37 PM  Result Value Ref Range   Glucose-Capillary 274 (H) 70 - 99 mg/dL    Comment: Glucose reference range applies only to samples taken after fasting for at least 8 hours.  Urine Drug Screen, Qualitative ([facility] only)     Status: None   Collection Time: 12/22/22  3:03 AM  Result Value Ref Range   Tricyclic, Ur Screen NONE DETECTED NONE DETECTED   Amphetamines, Ur Screen NONE DETECTED NONE DETECTED   MDMA (Ecstasy)Ur Screen NONE DETECTED NONE DETECTED   Cocaine Metabolite,Ur [place] NONE DETECTED NONE DETECTED   Opiate, Ur Screen NONE DETECTED NONE DETECTED   Phencyclidine (PCP) Ur S NONE DETECTED NONE DETECTED   Cannabinoid 50 Ng, Ur [place] NONE DETECTED NONE DETECTED   Barbiturates, Ur Screen NONE DETECTED NONE DETECTED   Benzodiazepine, Ur Scrn NONE DETECTED NONE DETECTED   Methadone Scn, Ur NONE DETECTED NONE DETECTED    Comment: (NOTE) Tricyclics + metabolites, urine    Cutoff 1000 ng/mL Amphetamines + metabolites, urine  Cutoff 1000 ng/mL MDMA (Ecstasy), urine              Cutoff 500 ng/mL Cocaine Metabolite, urine          Cutoff 300 ng/mL Opiate + metabolites, urine         Cutoff 300 ng/mL Phencyclidine (PCP), urine         Cutoff 25 ng/mL Cannabinoid, urine                 Cutoff 50 ng/mL Barbiturates + metabolites, urine  Cutoff 200 ng/mL Benzodiazepine, urine              Cutoff 200 ng/mL Methadone, urine                   Cutoff 300 ng/mL  The urine drug screen provides only a preliminary, unconfirmed analytical test result and should not be used for non-medical purposes. Clinical consideration and professional judgment should be applied to any positive drug screen result due to possible interfering substances. A more specific alternate chemical method must be used in order to obtain a confirmed analytical result. Gas chromatography / mass spectrometry (GC/MS) is the preferred confirm atory method. Performed at [facility], [address] [place], [place] [address]   Troponin I (High Sensitivity)  Status: None   Collection Time: 12/22/22  3:03 AM  Result Value Ref Range   Troponin I (High Sensitivity) 5 <18 ng/L    Comment: (NOTE) Elevated high sensitivity troponin I (hsTnI) values and significant  changes across serial measurements may suggest ACS but many other  chronic and acute conditions are known to elevate hsTnI results.  Refer to the "Links" section for chest pain algorithms and additional  guidance. Performed at [facility], [address] [place], [place] [address]   POCT CBG (Fasting - Glucose)     Status: Abnormal   Collection Time: 12/23/22  9:58 AM  Result Value Ref Range   Glucose Fasting, POC 131 (A) 70 - 99 mg/dL  POCT CBG (Fasting - Glucose)     Status: Abnormal   Collection Time: 12/27/22  1:21 PM  Result Value Ref Range   Glucose Fasting, POC 109 (A) 70 - 99 mg/dL       Assessment & Plan:   Problem List Items Addressed This Visit   None Visit Diagnoses     Intractable episodic cluster headache    -  Primary   Relevant Medications   amitriptyline (ELAVIL) 10 MG tablet    Trigeminal nerve spasm       Relevant Medications   amitriptyline (ELAVIL) 10 MG tablet   Pain, dental         Will complete his paperwork and get letter ready for him. Sending amitriptyline in for him to see if it will prevent his headaches.   Return in about 2 weeks (around 01/15/2023) for F/U.   Total time spent: 20 minutes  Miki Kins, FNP  01/01/2023

## 2023-01-08 ENCOUNTER — Telehealth: Payer: Self-pay | Admitting: Internal Medicine

## 2023-01-08 NOTE — Telephone Encounter (Signed)
Patient's wife called stating that the Micheal Gay is not helping with his migraines. States that he has also tried Nurtec, Tylenol and ibuprofen with no relief. Please advise on what they should do.   Will call wife back with recommendations.

## 2023-01-08 NOTE — Telephone Encounter (Signed)
Paperwork was completed and patient has letter.

## 2023-01-14 ENCOUNTER — Emergency Department
Admission: EM | Admit: 2023-01-14 | Discharge: 2023-01-14 | Disposition: A | Discharge status: Home / Self Care | Payer: BC Managed Care – PPO | Attending: Emergency Medicine | Admitting: Emergency Medicine

## 2023-01-14 ENCOUNTER — Encounter: Payer: Self-pay | Admitting: Emergency Medicine

## 2023-01-14 ENCOUNTER — Other Ambulatory Visit: Payer: Self-pay

## 2023-01-14 ENCOUNTER — Other Ambulatory Visit: Payer: Self-pay | Admitting: Internal Medicine

## 2023-01-14 DIAGNOSIS — R519 Headache, unspecified: Secondary | ICD-10-CM | POA: Insufficient documentation

## 2023-01-14 DIAGNOSIS — R300 Dysuria: Secondary | ICD-10-CM | POA: Insufficient documentation

## 2023-01-14 LAB — URINALYSIS, W/ REFLEX TO CULTURE (INFECTION SUSPECTED)
Bilirubin Urine: NEGATIVE
Glucose, UA: NEGATIVE mg/dL
Hgb urine dipstick: NEGATIVE
Ketones, ur: NEGATIVE mg/dL
Leukocytes,Ua: NEGATIVE
Nitrite: NEGATIVE
Protein, ur: NEGATIVE mg/dL
Specific Gravity, Urine: 1.023 (ref 1.005–1.030)
pH: 5 (ref 5.0–8.0)

## 2023-01-14 MED ORDER — ACETAMINOPHEN 325 MG PO TABS
650.0000 mg | ORAL_TABLET | Freq: Once | ORAL | Status: AC
  Administered 2023-01-14: 650 mg via ORAL
  Filled 2023-01-14: qty 2

## 2023-01-14 MED ORDER — ONDANSETRON HCL 4 MG/2ML IJ SOLN
4.0000 mg | Freq: Once | INTRAMUSCULAR | Status: AC
  Administered 2023-01-14: 4 mg via INTRAVENOUS
  Filled 2023-01-14: qty 2

## 2023-01-14 MED ORDER — KETOROLAC TROMETHAMINE 15 MG/ML IJ SOLN
15.0000 mg | Freq: Once | INTRAMUSCULAR | Status: AC
  Administered 2023-01-14: 15 mg via INTRAVENOUS
  Filled 2023-01-14: qty 1

## 2023-01-14 MED ORDER — PROMETHAZINE HCL 12.5 MG PO TABS: 12.5000 mg | ORAL_TABLET | Freq: Four times a day (QID) | ORAL | 0 refills | Status: DC | PRN

## 2023-01-14 MED ORDER — SODIUM CHLORIDE 0.9 % IV BOLUS
1000.0000 mL | Freq: Once | INTRAVENOUS | Status: AC
  Administered 2023-01-14: 1000 mL via INTRAVENOUS

## 2023-01-14 NOTE — ED Triage Notes (Signed)
Patient to ED for headache x4 weeks. Headaches are intermittent since getting 7 teeth pulled. Endorses some light sensitivity. Has taken over the counter meds with no relief.

## 2023-01-14 NOTE — Discharge Instructions (Signed)
Your seen in the emergency room today for evaluation of your headache.  Your exam was overall reassuring.  We were limited in the medications that we could give you as you were planning to drive home.  I have sent a prescription for a nausea medicine called Phenergan that can make you drowsy, but may work better than your current nausea medicine.  Do not take these together.  Your urine test fortunately without evidence of infection.  Keep your scheduled follow-up with your primary care doctor tomorrow.  Return to the ER for new or worsening symptoms.

## 2023-01-14 NOTE — ED Provider Notes (Signed)
[facility] Provider Note    Event Date/Time   First MD Initiated Contact with Patient 01/14/23 0957     (approximate)   History   Headache   HPI  Micheal Gay is a 57 y.o. male presenting to the ER for evaluation of headache.  Has had an ongoing frontal headache for several months, being treated for migrained.  Has been seeing his primary care doctor, trialed several medicines.  No recent trauma.  No new numbness, tingling, weakness.  Had ongoing pain despite taking his home medication leading to ER presentation.  Did have dental extractions recently and has had some decreased p.o. during this time.  Additionally reports some recent dysuria.     Physical Exam   Triage Vital Signs: ED Triage Vitals [01/14/23 0939]  Enc Vitals Group     BP 117/76     Pulse Rate 83     Resp 18     Temp 98.6 F (37 C)     Temp Source Oral     SpO2 98 %     Weight      Height      Head Circumference      Peak Flow      Pain Score 10     Pain Loc      Pain Edu?      Excl. in GC?     Most recent vital signs: Vitals:   01/14/23 0939  BP: 117/76  Pulse: 83  Resp: 18  Temp: 98.6 F (37 C)  SpO2: 98%     General: Awake, no distress.  HEENT: Head atraumatic, TM clear bilaterally.  Edentulous, well-healing gums without visible abscess CV:  Good peripheral perfusion. Resp:  Normal effort.  Abd:  No distention.  Neuro:  5-5 strength in bilateral upper and lower extremities with normal sensation.  Normal coordination, normal finger-to-nose testing.  Pupils equal and reactive bilaterally.   ED Results / Procedures / Treatments   Labs (all labs ordered are listed, but only abnormal results are displayed) Labs Reviewed  URINALYSIS, W/ REFLEX TO CULTURE (INFECTION SUSPECTED) - Abnormal; Notable for the following components:      Result Value   Color, Urine YELLOW (*)    APPearance CLEAR (*)    Bacteria, UA RARE (*)    All other components within normal  limits     Critical Care performed: No  Procedures   MEDICATIONS ORDERED IN ED: Medications  acetaminophen (TYLENOL) tablet 650 mg (650 mg Oral Given 01/14/23 1025)  ketorolac (TORADOL) 15 MG/ML injection 15 mg (15 mg Intravenous Given 01/14/23 1023)  sodium chloride 0.9 % bolus 1,000 mL (0 mLs Intravenous Stopped 01/14/23 1139)  ondansetron (ZOFRAN) injection 4 mg (4 mg Intravenous Given 01/14/23 1023)     IMPRESSION / MDM / ASSESSMENT AND PLAN / ED COURSE  I reviewed the triage vital signs and the nursing notes.   Differential diagnosis includes, but is not limited to, suspect likely benign headache, no recent change in symptoms, trauma, focal neurologic findings suggestive of significant acute process.  Patient's presentation is most consistent with exacerbation of chronic illness.  57 year old male presenting with ongoing headache for several months.  No focal neurologic findings here.  Will treat symptomatically.  Is planning to drive home, so we will treat with IV fluids, Zofran, Tylenol, Toradol.  Will also send urinalysis given reported dysuria.  Patient reports some improvement in symptoms on reevaluation.  Urinalysis without evidence of infection.  Did  discuss that given his reassuring neurologic exam, do think he is stable for continued outpatient follow-up.  He actually has follow-up with his primary care office tomorrow.  Patient comfortable this plan.  Does report some ongoing nausea despite Zofran, will DC with a short course of Phenergan to see if this is more effective.  Did discuss that this can cause drowsiness and not to drive or operate machinery when taking this.  Strict return precautions provided.  Patient discharged stable condition.       FINAL CLINICAL IMPRESSION(S) / ED DIAGNOSES   Final diagnoses:  Frontal headache  Dysuria     Rx / DC Orders   ED Discharge Orders          Ordered    promethazine (PHENERGAN) 12.5 MG tablet  Every 6 hours PRN         01/14/23 1335             Note:  This document was prepared using Dragon voice recognition software and may include unintentional dictation errors.   Trinna Post, MD 01/14/23 1341

## 2023-01-15 ENCOUNTER — Encounter: Payer: Self-pay | Admitting: Internal Medicine

## 2023-01-15 ENCOUNTER — Ambulatory Visit: Payer: BC Managed Care – PPO | Admitting: Internal Medicine

## 2023-01-15 ENCOUNTER — Other Ambulatory Visit: Payer: Self-pay | Admitting: Internal Medicine

## 2023-01-15 VITALS — BP 108/78 | HR 88 | Ht 71.0 in | Wt 197.2 lb

## 2023-01-15 DIAGNOSIS — G44209 Tension-type headache, unspecified, not intractable: Secondary | ICD-10-CM

## 2023-01-15 DIAGNOSIS — I10 Essential (primary) hypertension: Secondary | ICD-10-CM | POA: Diagnosis not present

## 2023-01-15 DIAGNOSIS — E119 Type 2 diabetes mellitus without complications: Secondary | ICD-10-CM

## 2023-01-15 LAB — POCT CBG (FASTING - GLUCOSE)-MANUAL ENTRY: Glucose Fasting, POC: 214 mg/dL — AB (ref 70–99)

## 2023-01-15 LAB — FRUCTOSAMINE: Fructosamine: 262 umol/L (ref 0–285)

## 2023-01-15 MED ORDER — BUTALBITAL-APAP-CAFFEINE 50-325-40 MG PO TABS
1.0000 | ORAL_TABLET | Freq: Four times a day (QID) | ORAL | 1 refills | Status: AC | PRN
Start: 2023-01-15 — End: 2023-03-16

## 2023-01-15 NOTE — Progress Notes (Signed)
Established Patient Office Visit  Subjective:  Patient ID: Micheal Gay, male    DOB: [birth year]  Age: 57 y.o. MRN: [id number]  Chief Complaint  Patient presents with   Follow-up    6 week follow up, discuss lab results.    No new complaints, here for lab review and medication refills. C/o recurrent frontal headaches which develop at work associated with jaw clenching. Headache only partially relieved by nurtec pills and spray. Labs reviewed and notable for well controlled diabetes per Fructosamine of 262.    No other concerns at this time.   Past Medical History:  Diagnosis Date   Bipolar 1 disorder (HCC)    Depression    Diabetes mellitus without complication (HCC)    Keratoconus of right eye    Sepsis (HCC) 09/22/2016   Septicemia (HCC) 09/22/2016    Past Surgical History:  Procedure Laterality Date   EYE SURGERY      Social History   Socioeconomic History   Marital status: Divorced    Spouse name: Not on file   Number of children: Not on file   Years of education: Not on file   Highest education level: Not on file  Occupational History   Not on file  Tobacco Use   Smoking status: Never   Smokeless tobacco: Former    Types: Chew    Quit date: 09/09/1982  Substance and Sexual Activity   Alcohol use: No   Drug use: No   Sexual activity: Not on file  Other Topics Concern   Not on file  Social History Narrative   Not on file   Social Determinants of Health   Financial Resource Strain: Not on file  Food Insecurity: Not on file  Transportation Needs: Not on file  Physical Activity: Not on file  Stress: Not on file  Social Connections: Not on file  Intimate Partner Violence: Not on file    Family History  Problem Relation Age of Onset   Diabetes Mother    Breast cancer Mother    Heart attack Father    Prostate cancer Neg Hx    Kidney cancer Neg Hx    Bladder Cancer Neg Hx     Allergies  Allergen Reactions   Other     Animal  Dandruff Hay     Pollen Extract     Review of Systems  Constitutional: Negative.   HENT: Negative.    Eyes: Negative.   Respiratory: Negative.    Cardiovascular: Negative.   Gastrointestinal: Negative.   Genitourinary: Negative.   Skin: Negative.   Neurological: Negative.   Endo/Heme/Allergies: Negative.        Objective:   BP 108/78   Pulse 88   Ht 5\' 11"  (1.803 m)   Wt 197 lb 3.2 oz (89.4 kg)   SpO2 97%   BMI 27.50 kg/m   Vitals:   01/15/23 1056  BP: 108/78  Pulse: 88  Height: 5\' 11"  (1.803 m)  Weight: 197 lb 3.2 oz (89.4 kg)  SpO2: 97%  BMI (Calculated): 27.52    Physical Exam Vitals reviewed.  Constitutional:      Appearance: Normal appearance.  HENT:     Head: Normocephalic.     Left Ear: There is no impacted cerumen.     Nose: Nose normal.     Mouth/Throat:     Mouth: Mucous membranes are moist.     Pharynx: No posterior oropharyngeal erythema.  Eyes:     Extraocular Movements: Extraocular movements  intact.     Pupils: Pupils are equal, round, and reactive to light.  Cardiovascular:     Rate and Rhythm: Regular rhythm.     Chest Wall: PMI is not displaced.     Pulses: Normal pulses.     Heart sounds: Normal heart sounds. No murmur heard. Pulmonary:     Effort: Pulmonary effort is normal.     Breath sounds: Normal air entry. No rhonchi or rales.  Abdominal:     General: Abdomen is flat. Bowel sounds are normal. There is no distension.     Palpations: Abdomen is soft. There is no hepatomegaly, splenomegaly or mass.     Tenderness: There is no abdominal tenderness.  Musculoskeletal:        General: Normal range of motion.     Cervical back: Normal range of motion and neck supple.     Right lower leg: No edema.     Left lower leg: No edema.  Skin:    General: Skin is warm and dry.  Neurological:     General: No focal deficit present.     Mental Status: He is alert and oriented to person, place, and time.     Cranial Nerves: No cranial  nerve deficit.     Motor: No weakness.  Psychiatric:        Mood and Affect: Mood normal.        Behavior: Behavior normal.      Results for orders placed or performed in visit on 01/15/23  POCT CBG (Fasting - Glucose)  Result Value Ref Range   Glucose Fasting, POC 214 (A) 70 - 99 mg/dL    Recent Results (from the past 2160 hour(Chyrel Taha))  Fructosamine     Status: Abnormal   Collection Time: 10/24/22 10:12 AM  Result Value Ref Range   Fructosamine 299 (H) 0 - 285 umol/L    Comment: Published reference interval for apparently healthy subjects between age 35 and 21 is 83 - 285 umol/L and in a poorly controlled diabetic population is 228 - 563 umol/L with a mean of 396 umol/L.   POCT Glucose (CBG)     Status: Abnormal   Collection Time: 10/25/22 11:08 AM  Result Value Ref Range   POC Glucose 210 (A) 70 - 99 mg/dl  POCT Urine microalbumin-creatinine with uACR     Status: Normal   Collection Time: 10/25/22 11:43 AM  Result Value Ref Range   Microalbumin Ur, POC 50 mg/L   Creatinine, POC 10 mg/dL   Albumin/Creatinine Ratio, Urine, POC <30   CBG monitoring, ED     Status: Abnormal   Collection Time: 12/01/22 10:27 AM  Result Value Ref Range   Glucose-Capillary 132 (H) 70 - 99 mg/dL    Comment: Glucose reference range applies only to samples taken after fasting for at least 8 hours.  Troponin I (High Sensitivity)     Status: None   Collection Time: 12/21/22 11:31 PM  Result Value Ref Range   Troponin I (High Sensitivity) 4 <18 ng/L    Comment: (NOTE) Elevated high sensitivity troponin I (hsTnI) values and significant  changes across serial measurements may suggest ACS but many other  chronic and acute conditions are known to elevate hsTnI results.  Refer to the "Links" section for chest pain algorithms and additional  guidance. Performed at [facility], [address], [place], [place] [address]   Magnesium     Status: None   Collection Time: 12/21/22 11:31 PM  Result Value Ref Range   Magnesium 2.0 1.7 - 2.4 mg/dL    Comment: Performed at [facility], [address] [place], [place] [address]  TSH     Status: None   Collection Time: 12/21/22 11:31 PM  Result Value Ref Range   TSH 1.493 0.350 - 4.500 uIU/mL    Comment: Performed by a 3rd Generation assay with a functional sensitivity of <=0.01 uIU/mL. Performed at [facility], [address] [place], [place] [address]   Basic metabolic panel     Status: Abnormal   Collection Time: 12/21/22 11:32 PM  Result Value Ref Range   Sodium 129 (L) 135 - 145 mmol/L   Potassium 4.2 3.5 - 5.1 mmol/L   Chloride 94 (L) 98 - 111 mmol/L   CO2 25 22 - 32 mmol/L   Glucose, Bld 281 (H) 70 - 99 mg/dL    Comment: Glucose reference range applies only to samples taken after fasting for at least 8 hours.   BUN 41 (H) 6 - 20 mg/dL   Creatinine, Ser 9.14 0.61 - 1.24 mg/dL   Calcium 8.5 (L) 8.9 - 10.3 mg/dL   GFR, Estimated >78 >29 mL/min    Comment: (NOTE) Calculated using the CKD-EPI Creatinine Equation (2021)    Anion gap 10 5 - 15    Comment: Performed at [facility], [address] [place], [place] [address]  CBC     Status: None   Collection Time: 12/21/22 11:32 PM  Result Value Ref Range   WBC 7.2 4.0 - 10.5 K/uL   RBC 4.58 4.22 - 5.81 MIL/uL   Hemoglobin 14.1 13.0 - 17.0 g/dL   HCT 08.6 57.8 - 46.9 %   MCV 93.4 80.0 - 100.0 fL   MCH 30.8 26.0 - 34.0 pg   MCHC 32.9 30.0 - 36.0 g/dL   RDW 62.9 52.8 - 41.3 %   Platelets 266 150 - 400 K/uL   nRBC 0.0 0.0 - 0.2 %    Comment: Performed at [facility] [address] [place], [place] [address]  CBG monitoring, ED     Status: Abnormal   Collection Time: 12/21/22 11:37 PM  Result Value Ref Range   Glucose-Capillary 274 (H) 70 - 99 mg/dL    Comment: Glucose reference range applies only to samples taken after fasting for at least 8 hours.  Urine Drug Screen, Qualitative ([facility] only)     Status: None    Collection Time: 12/22/22  3:03 AM  Result Value Ref Range   Tricyclic, Ur Screen NONE DETECTED NONE DETECTED   Amphetamines, Ur Screen NONE DETECTED NONE DETECTED   MDMA (Ecstasy)Ur Screen NONE DETECTED NONE DETECTED   Cocaine Metabolite,Ur [place] NONE DETECTED NONE DETECTED   Opiate, Ur Screen NONE DETECTED NONE DETECTED   Phencyclidine (PCP) Ur [place] NONE DETECTED NONE DETECTED   Cannabinoid 50 Ng, Ur [place] NONE DETECTED NONE DETECTED   Barbiturates, Ur Screen NONE DETECTED NONE DETECTED   Benzodiazepine, Ur Scrn NONE DETECTED NONE DETECTED   Methadone Scn, Ur NONE DETECTED NONE DETECTED    Comment: (NOTE) Tricyclics + metabolites, urine    Cutoff 1000 ng/mL Amphetamines + metabolites, urine  Cutoff 1000 ng/mL MDMA (Ecstasy), urine              Cutoff 500 ng/mL Cocaine Metabolite, urine          Cutoff 300 ng/mL Opiate + metabolites, urine        Cutoff 300 ng/mL Phencyclidine (  PCP), urine         Cutoff 25 ng/mL Cannabinoid, urine                 Cutoff 50 ng/mL Barbiturates + metabolites, urine  Cutoff 200 ng/mL Benzodiazepine, urine              Cutoff 200 ng/mL Methadone, urine                   Cutoff 300 ng/mL  The urine drug screen provides only a preliminary, unconfirmed analytical test result and should not be used for non-medical purposes. Clinical consideration and professional judgment should be applied to any positive drug screen result due to possible interfering substances. A more specific alternate chemical method must be used in order to obtain a confirmed analytical result. Gas chromatography / mass spectrometry (GC/MS) is the preferred confirm atory method. Performed at [facility], [address] [place], [place] [address]   Troponin I (High Sensitivity)     Status: None   Collection Time: 12/22/22  3:03 AM  Result Value Ref Range   Troponin I (High Sensitivity) 5 <18 ng/L    Comment: (NOTE) Elevated high sensitivity troponin I (hsTnI) values and  significant  changes across serial measurements may suggest ACS but many other  chronic and acute conditions are known to elevate hsTnI results.  Refer to the "Links" section for chest pain algorithms and additional  guidance. Performed at [facility], [address] [place], [place] [address]   POCT CBG (Fasting - Glucose)     Status: Abnormal   Collection Time: 12/23/22  9:58 AM  Result Value Ref Range   Glucose Fasting, POC 131 (A) 70 - 99 mg/dL  POCT CBG (Fasting - Glucose)     Status: Abnormal   Collection Time: 12/27/22  1:21 PM  Result Value Ref Range   Glucose Fasting, POC 109 (A) 70 - 99 mg/dL  Fructosamine     Status: None   Collection Time: 01/14/23  8:47 AM  Result Value Ref Range   Fructosamine 262 0 - 285 umol/L    Comment: Published reference interval for apparently healthy subjects between age 56 and 48 is 49 - 285 umol/L and in a poorly controlled diabetic population is 228 - 563 umol/L with a mean of 396 umol/L.   Urinalysis, w/ Reflex to Culture (Infection Suspected) -Urine, Clean Catch     Status: Abnormal   Collection Time: 01/14/23 10:28 AM  Result Value Ref Range   Specimen Source URINE, CLEAN CATCH    Color, Urine YELLOW (A) YELLOW   APPearance CLEAR (A) CLEAR   Specific Gravity, Urine 1.023 1.005 - 1.030   pH 5.0 5.0 - 8.0   Glucose, UA NEGATIVE NEGATIVE mg/dL   Hgb urine dipstick NEGATIVE NEGATIVE   Bilirubin Urine NEGATIVE NEGATIVE   Ketones, ur NEGATIVE NEGATIVE mg/dL   Protein, ur NEGATIVE NEGATIVE mg/dL   Nitrite NEGATIVE NEGATIVE   Leukocytes,Ua NEGATIVE NEGATIVE   RBC / HPF 0-5 0 - 5 RBC/hpf   WBC, UA 0-5 0 - 5 WBC/hpf    Comment:        Reflex urine culture not performed if WBC <=10, OR if Squamous epithelial cells >5. If Squamous epithelial cells >5 suggest recollection.    Bacteria, UA RARE (A) NONE SEEN   Squamous Epithelial / HPF 0-5 0 - 5 /HPF   Mucus PRESENT    Hyaline Casts, UA PRESENT     Comment:  Performed at  [facility], [address] [place], [place] [address]  POCT CBG (Fasting - Glucose)     Status: Abnormal   Collection Time: 01/15/23 11:03 AM  Result Value Ref Range   Glucose Fasting, POC 214 (A) 70 - 99 mg/dL      Assessment & Plan:   Problem List Items Addressed This Visit   None Visit Diagnoses     Type 2 diabetes mellitus without complication, without long-term current use of insulin (HCC)    -  Primary   Relevant Orders   POCT CBG (Fasting - Glucose) (Completed)      Return to work 01/20/23 No follow-ups on file.   Total time spent: 30 minutes  Luna Fuse, MD  01/15/2023

## 2023-01-16 ENCOUNTER — Telehealth: Payer: Self-pay

## 2023-01-16 NOTE — Telephone Encounter (Signed)
Patient needs another work note written that states that he can return to work on May 21st without restrictions needs this faxed to [phone]

## 2023-01-17 ENCOUNTER — Encounter: Payer: Self-pay | Admitting: Internal Medicine

## 2023-01-17 ENCOUNTER — Ambulatory Visit: Payer: BC Managed Care – PPO | Admitting: Family

## 2023-01-20 ENCOUNTER — Other Ambulatory Visit: Payer: Self-pay | Admitting: Family

## 2023-01-23 ENCOUNTER — Telehealth: Payer: Self-pay | Admitting: Internal Medicine

## 2023-01-23 NOTE — Telephone Encounter (Signed)
Called to see if okay to switch from Zavzpret to zolmitriptan. Please advise. If you received a fax regarding this please just answer it and fax back.   Callback # [id number]  Reference # [id number]

## 2023-01-24 ENCOUNTER — Other Ambulatory Visit: Payer: Self-pay | Admitting: Internal Medicine

## 2023-01-27 ENCOUNTER — Telehealth: Payer: Self-pay | Admitting: Internal Medicine

## 2023-01-27 NOTE — Telephone Encounter (Signed)
Patient left another VM wanting a call back. When he came up here earlier he told the front desk that he was taking the butalbital and Nurtec both daily. He is requesting a call back today.

## 2023-01-27 NOTE — Telephone Encounter (Signed)
Patient left 2 VM having questions about his medication for his headaches that Dr. Ellsworth Lennox put him on. He states that he believes it is causing him to be sick on his stomach and is requesting we send him in something for the upset stomach. Please advise.

## 2023-01-30 ENCOUNTER — Other Ambulatory Visit: Payer: Self-pay | Admitting: Student

## 2023-01-30 DIAGNOSIS — R519 Headache, unspecified: Secondary | ICD-10-CM

## 2023-02-02 ENCOUNTER — Other Ambulatory Visit: Payer: Self-pay | Admitting: Family

## 2023-02-10 ENCOUNTER — Ambulatory Visit: Payer: BC Managed Care – PPO | Admitting: Internal Medicine

## 2023-02-10 VITALS — BP 112/64 | HR 120 | Ht 71.5 in | Wt 175.0 lb

## 2023-02-10 DIAGNOSIS — E119 Type 2 diabetes mellitus without complications: Secondary | ICD-10-CM | POA: Insufficient documentation

## 2023-02-10 DIAGNOSIS — G44209 Tension-type headache, unspecified, not intractable: Secondary | ICD-10-CM | POA: Diagnosis not present

## 2023-02-10 LAB — POCT CBG (FASTING - GLUCOSE)-MANUAL ENTRY: Glucose Fasting, POC: 135 mg/dL — AB (ref 70–99)

## 2023-02-10 NOTE — Progress Notes (Signed)
Established Patient Office Visit  Subjective:  Patient ID: Micheal Gay, male    DOB: 10-16-1965  Age: 57 y.o. MRN: [id number]  Chief Complaint  Patient presents with   Acute Visit    Dizzy    C/o nausea and vomiting. Still c/o headaches but saw his neurologist who placed him on nortryptyline and steroids.     No other concerns at this time.   Past Medical History:  Diagnosis Date   Bipolar 1 disorder (HCC)    Depression    Diabetes mellitus without complication (HCC)    Keratoconus of right eye    Sepsis (HCC) 09/22/2016   Septicemia (HCC) 09/22/2016    Past Surgical History:  Procedure Laterality Date   EYE SURGERY      Social History   Socioeconomic History   Marital status: Divorced    Spouse name: Not on file   Number of children: Not on file   Years of education: Not on file   Highest education level: Not on file  Occupational History   Not on file  Tobacco Use   Smoking status: Never   Smokeless tobacco: Former    Types: Chew    Quit date: 09/09/1982  Substance and Sexual Activity   Alcohol use: No   Drug use: No   Sexual activity: Not on file  Other Topics Concern   Not on file  Social History Narrative   Not on file   Social Determinants of Health   Financial Resource Strain: Not on file  Food Insecurity: Not on file  Transportation Needs: Not on file  Physical Activity: Not on file  Stress: Not on file  Social Connections: Not on file  Intimate Partner Violence: Not on file    Family History  Problem Relation Age of Onset   Diabetes Mother    Breast cancer Mother    Heart attack Father    Prostate cancer Neg Hx    Kidney cancer Neg Hx    Bladder Cancer Neg Hx     Allergies  Allergen Reactions   Other     Animal Dandruff Hay     Pollen Extract     Review of Systems  Constitutional: Negative.   HENT: Negative.    Eyes: Negative.   Respiratory: Negative.    Cardiovascular: Negative.   Gastrointestinal: Negative.    Genitourinary: Negative.   Skin: Negative.   Neurological: Negative.   Endo/Heme/Allergies: Negative.        Objective:   BP 112/64   Pulse (!) 120   Ht 5' 11.5" (1.816 m)   Wt 175 lb (79.4 kg)   SpO2 97%   BMI 24.07 kg/m   Vitals:   02/10/23 1136  BP: 112/64  Pulse: (!) 120  Height: 5' 11.5" (1.816 m)  Weight: 175 lb (79.4 kg)  SpO2: 97%  BMI (Calculated): 24.07    Physical Exam Vitals reviewed.  Constitutional:      Appearance: Normal appearance.  HENT:     Head: Normocephalic.     Left Ear: There is no impacted cerumen.     Nose: Nose normal.     Mouth/Throat:     Mouth: Mucous membranes are moist.     Pharynx: No posterior oropharyngeal erythema.  Eyes:     Extraocular Movements: Extraocular movements intact.     Pupils: Pupils are equal, round, and reactive to light.  Cardiovascular:     Rate and Rhythm: Regular rhythm.     Chest Wall:  PMI is not displaced.     Pulses: Normal pulses.     Heart sounds: Normal heart sounds. No murmur heard. Pulmonary:     Effort: Pulmonary effort is normal.     Breath sounds: Normal air entry. No rhonchi or rales.  Abdominal:     General: Abdomen is flat. Bowel sounds are normal. There is no distension.     Palpations: Abdomen is soft. There is no hepatomegaly, splenomegaly or mass.     Tenderness: There is no abdominal tenderness.  Musculoskeletal:        General: Normal range of motion.     Cervical back: Normal range of motion and neck supple.     Right lower leg: No edema.     Left lower leg: No edema.  Skin:    General: Skin is warm and dry.  Neurological:     General: No focal deficit present.     Mental Status: He is alert and oriented to person, place, and time.     Cranial Nerves: No cranial nerve deficit.     Motor: No weakness.  Psychiatric:        Mood and Affect: Mood normal.        Behavior: Behavior normal.      Results for orders placed or performed in visit on 02/10/23  POCT CBG (Fasting  - Glucose)  Result Value Ref Range   Glucose Fasting, POC 135 (A) 70 - 99 mg/dL    Recent Results (from the past 2160 hour(Lando Alcalde))  CBG monitoring, ED     Status: Abnormal   Collection Time: 12/01/22 10:27 AM  Result Value Ref Range   Glucose-Capillary 132 (H) 70 - 99 mg/dL    Comment: Glucose reference range applies only to samples taken after fasting for at least 8 hours.  Troponin I (High Sensitivity)     Status: None   Collection Time: 12/21/22 11:31 PM  Result Value Ref Range   Troponin I (High Sensitivity) 4 <18 ng/L    Comment: (NOTE) Elevated high sensitivity troponin I (hsTnI) values and significant  changes across serial measurements may suggest ACS but many other  chronic and acute conditions are known to elevate hsTnI results.  Refer to the "Links" section for chest pain algorithms and additional  guidance. Performed at [facility], [address] [place], [place] [address]   Magnesium     Status: None   Collection Time: 12/21/22 11:31 PM  Result Value Ref Range   Magnesium 2.0 1.7 - 2.4 mg/dL    Comment: Performed at [facility], [address] [place], [place] [address]  TSH     Status: None   Collection Time: 12/21/22 11:31 PM  Result Value Ref Range   TSH 1.493 0.350 - 4.500 uIU/mL    Comment: Performed by a 3rd Generation assay with a functional sensitivity of <=0.01 uIU/mL. Performed at [facility], [address] [place], [place] [address]   Basic metabolic panel     Status: Abnormal   Collection Time: 12/21/22 11:32 PM  Result Value Ref Range   Sodium 129 (L) 135 - 145 mmol/L   Potassium 4.2 3.5 - 5.1 mmol/L   Chloride 94 (L) 98 - 111 mmol/L   CO2 25 22 - 32 mmol/L   Glucose, Bld 281 (H) 70 - 99 mg/dL    Comment: Glucose reference range applies only to samples taken after fasting for at least 8 hours.   BUN 41 (H) 6 -  20 mg/dL   Creatinine, Ser 4.09 0.61 - 1.24 mg/dL   Calcium 8.5 (L) 8.9 - 10.3 mg/dL   GFR, Estimated  >81 >19 mL/min    Comment: (NOTE) Calculated using the CKD-EPI Creatinine Equation (2021)    Anion gap 10 5 - 15    Comment: Performed at [facility], [address] [place], [place] [address]  CBC     Status: None   Collection Time: 12/21/22 11:32 PM  Result Value Ref Range   WBC 7.2 4.0 - 10.5 K/uL   RBC 4.58 4.22 - 5.81 MIL/uL   Hemoglobin 14.1 13.0 - 17.0 g/dL   HCT 95.6 21.3 - 08.6 %   MCV 93.4 80.0 - 100.0 fL   MCH 30.8 26.0 - 34.0 pg   MCHC 32.9 30.0 - 36.0 g/dL   RDW 57.8 46.9 - 62.9 %   Platelets 266 150 - 400 K/uL   nRBC 0.0 0.0 - 0.2 %    Comment: Performed at [facility], [address] [place], [place] [address]  CBG monitoring, ED     Status: Abnormal   Collection Time: 12/21/22 11:37 PM  Result Value Ref Range   Glucose-Capillary 274 (H) 70 - 99 mg/dL    Comment: Glucose reference range applies only to samples taken after fasting for at least 8 hours.  Urine Drug Screen, Qualitative ([facility] only)     Status: None   Collection Time: 12/22/22  3:03 AM  Result Value Ref Range   Tricyclic, Ur Screen NONE DETECTED NONE DETECTED   Amphetamines, Ur Screen NONE DETECTED NONE DETECTED   MDMA (Ecstasy)Ur Screen NONE DETECTED NONE DETECTED   Cocaine Metabolite,Ur [place] NONE DETECTED NONE DETECTED   Opiate, Ur Screen NONE DETECTED NONE DETECTED   Phencyclidine (PCP) Ur Jonet Mathies NONE DETECTED NONE DETECTED   Cannabinoid 50 Ng, Ur [place] NONE DETECTED NONE DETECTED   Barbiturates, Ur Screen NONE DETECTED NONE DETECTED   Benzodiazepine, Ur Scrn NONE DETECTED NONE DETECTED   Methadone Scn, Ur NONE DETECTED NONE DETECTED    Comment: (NOTE) Tricyclics + metabolites, urine    Cutoff 1000 ng/mL Amphetamines + metabolites, urine  Cutoff 1000 ng/mL MDMA (Ecstasy), urine              Cutoff 500 ng/mL Cocaine Metabolite, urine          Cutoff 300 ng/mL Opiate + metabolites, urine        Cutoff 300 ng/mL Phencyclidine (PCP), urine         Cutoff 25 ng/mL Cannabinoid, urine                  Cutoff 50 ng/mL Barbiturates + metabolites, urine  Cutoff 200 ng/mL Benzodiazepine, urine              Cutoff 200 ng/mL Methadone, urine                   Cutoff 300 ng/mL  The urine drug screen provides only a preliminary, unconfirmed analytical test result and should not be used for non-medical purposes. Clinical consideration and professional judgment should be applied to any positive drug screen result due to possible interfering substances. A more specific alternate chemical method must be used in order to obtain a confirmed analytical result. Gas chromatography / mass spectrometry (GC/MS) is the preferred confirm atory method. Performed at [facility], [address] [place], [place] [address]   Troponin I (High Sensitivity)     Status: None  Collection Time: 12/22/22  3:03 AM  Result Value Ref Range   Troponin I (High Sensitivity) 5 <18 ng/L    Comment: (NOTE) Elevated high sensitivity troponin I (hsTnI) values and significant  changes across serial measurements may suggest ACS but many other  chronic and acute conditions are known to elevate hsTnI results.  Refer to the "Links" section for chest pain algorithms and additional  guidance. Performed at [facility], [address] [place], [place] [address]   POCT CBG (Fasting - Glucose)     Status: Abnormal   Collection Time: 12/23/22  9:58 AM  Result Value Ref Range   Glucose Fasting, POC 131 (A) 70 - 99 mg/dL  POCT CBG (Fasting - Glucose)     Status: Abnormal   Collection Time: 12/27/22  1:21 PM  Result Value Ref Range   Glucose Fasting, POC 109 (A) 70 - 99 mg/dL  Fructosamine     Status: None   Collection Time: 01/14/23  8:47 AM  Result Value Ref Range   Fructosamine 262 0 - 285 umol/L    Comment: Published reference interval for apparently healthy subjects between age 83 and 38 is 38 - 285 umol/L and in a poorly controlled diabetic population is 228 - 563 umol/L with a mean  of 396 umol/L.   Urinalysis, w/ Reflex to Culture (Infection Suspected) -Urine, Clean Catch     Status: Abnormal   Collection Time: 01/14/23 10:28 AM  Result Value Ref Range   Specimen Source URINE, CLEAN CATCH    Color, Urine YELLOW (A) YELLOW   APPearance CLEAR (A) CLEAR   Specific Gravity, Urine 1.023 1.005 - 1.030   pH 5.0 5.0 - 8.0   Glucose, UA NEGATIVE NEGATIVE mg/dL   Hgb urine dipstick NEGATIVE NEGATIVE   Bilirubin Urine NEGATIVE NEGATIVE   Ketones, ur NEGATIVE NEGATIVE mg/dL   Protein, ur NEGATIVE NEGATIVE mg/dL   Nitrite NEGATIVE NEGATIVE   Leukocytes,Ua NEGATIVE NEGATIVE   RBC / HPF 0-5 0 - 5 RBC/hpf   WBC, UA 0-5 0 - 5 WBC/hpf    Comment:        Reflex urine culture not performed if WBC <=10, OR if Squamous epithelial cells >5. If Squamous epithelial cells >5 suggest recollection.    Bacteria, UA RARE (A) NONE SEEN   Squamous Epithelial / HPF 0-5 0 - 5 /HPF   Mucus PRESENT    Hyaline Casts, UA PRESENT     Comment: Performed at [facility], [address] [place], [place] [address]  POCT CBG (Fasting - Glucose)     Status: Abnormal   Collection Time: 01/15/23 11:03 AM  Result Value Ref Range   Glucose Fasting, POC 214 (A) 70 - 99 mg/dL  POCT CBG (Fasting - Glucose)     Status: Abnormal   Collection Time: 02/10/23 11:42 AM  Result Value Ref Range   Glucose Fasting, POC 135 (A) 70 - 99 mg/dL      Assessment & Plan:   Continue prn Zofran for nausea. Fu with neurology for headache management. Problem List Items Addressed This Visit       Endocrine   Type 2 diabetes mellitus without complication, without long-term current use of insulin (HCC) - Primary   Relevant Orders   POCT CBG (Fasting - Glucose) (Completed)   Comprehensive metabolic panel     Other   Tension headache    Return in about 2 weeks (around 02/24/2023) for fu with labs prior.   Total time  spent: 20 minutes  Luna Fuse, MD  02/10/2023   This document may  have been prepared by [facility] Voice Recognition software and as such may include unintentional dictation errors.

## 2023-02-11 ENCOUNTER — Encounter: Payer: Self-pay | Admitting: Student

## 2023-02-18 ENCOUNTER — Other Ambulatory Visit: Payer: Self-pay | Admitting: Family

## 2023-02-20 ENCOUNTER — Encounter: Payer: Self-pay | Admitting: Student

## 2023-02-21 ENCOUNTER — Ambulatory Visit
Admission: RE | Admit: 2023-02-21 | Discharge: 2023-02-21 | Disposition: A | Discharge status: Home / Self Care | Payer: BC Managed Care – PPO | Source: Ambulatory Visit | Attending: Student | Admitting: Student

## 2023-02-21 DIAGNOSIS — R519 Headache, unspecified: Secondary | ICD-10-CM

## 2023-02-26 ENCOUNTER — Ambulatory Visit: Payer: BC Managed Care – PPO | Admitting: Internal Medicine

## 2023-03-30 ENCOUNTER — Other Ambulatory Visit: Payer: Self-pay | Admitting: Internal Medicine

## 2023-05-11 ENCOUNTER — Other Ambulatory Visit: Payer: Self-pay | Admitting: Internal Medicine

## 2023-05-15 DIAGNOSIS — Z1211 Encounter for screening for malignant neoplasm of colon: Secondary | ICD-10-CM | POA: Insufficient documentation

## 2023-05-15 DIAGNOSIS — R1314 Dysphagia, pharyngoesophageal phase: Secondary | ICD-10-CM | POA: Insufficient documentation

## 2023-05-15 DIAGNOSIS — R1319 Other dysphagia: Secondary | ICD-10-CM | POA: Insufficient documentation

## 2023-05-15 DIAGNOSIS — R634 Abnormal weight loss: Secondary | ICD-10-CM | POA: Insufficient documentation

## 2023-05-27 ENCOUNTER — Other Ambulatory Visit: Payer: Self-pay | Admitting: Family

## 2023-05-27 ENCOUNTER — Other Ambulatory Visit: Payer: Self-pay | Admitting: Internal Medicine

## 2023-05-29 LAB — COMPREHENSIVE METABOLIC PANEL
ALT: 57 IU/L — ABNORMAL HIGH (ref 0–44)
AST: 64 IU/L — ABNORMAL HIGH (ref 0–40)
Albumin: 4.3 g/dL (ref 3.8–4.9)
Alkaline Phosphatase: 73 IU/L (ref 44–121)
BUN/Creatinine Ratio: 26 — ABNORMAL HIGH (ref 9–20)
BUN: 25 mg/dL — ABNORMAL HIGH (ref 6–24)
Bilirubin Total: 0.2 mg/dL (ref 0.0–1.2)
CO2: 25 mmol/L (ref 20–29)
Calcium: 8.9 mg/dL (ref 8.7–10.2)
Chloride: 99 mmol/L (ref 96–106)
Creatinine, Ser: 0.96 mg/dL (ref 0.76–1.27)
Globulin, Total: 2.3 g/dL (ref 1.5–4.5)
Glucose: 120 mg/dL — ABNORMAL HIGH (ref 70–99)
Potassium: 4.1 mmol/L (ref 3.5–5.2)
Sodium: 139 mmol/L (ref 134–144)
Total Protein: 6.6 g/dL (ref 6.0–8.5)
eGFR: 93 mL/min/{1.73_m2} (ref >59)

## 2023-05-30 ENCOUNTER — Ambulatory Visit (INDEPENDENT_AMBULATORY_CARE_PROVIDER_SITE_OTHER): Payer: BC Managed Care – PPO | Admitting: Internal Medicine

## 2023-05-30 ENCOUNTER — Encounter: Payer: Self-pay | Admitting: Internal Medicine

## 2023-05-30 VITALS — BP 102/79 | HR 74 | Ht 71.0 in | Wt 217.2 lb

## 2023-05-30 DIAGNOSIS — E119 Type 2 diabetes mellitus without complications: Secondary | ICD-10-CM

## 2023-05-30 DIAGNOSIS — E782 Mixed hyperlipidemia: Secondary | ICD-10-CM

## 2023-05-30 NOTE — Progress Notes (Signed)
Established Patient Office Visit  Subjective:  Patient ID: Micheal Gay, male    DOB: [birth year]  Age: 57 y.o. MRN: [id number]  Chief Complaint  Patient presents with   Follow-up    No new complaints, here for lab review and medication refills. Bmp notable for elevated BUN.  No other concerns at this time.   Past Medical History:  Diagnosis Date   Bipolar 1 disorder (HCC)    Depression    Diabetes mellitus without complication (HCC)    Keratoconus of right eye    Sepsis (HCC) 09/22/2016   Septicemia (HCC) 09/22/2016    Past Surgical History:  Procedure Laterality Date   EYE SURGERY      Social History   Socioeconomic History   Marital status: Divorced    Spouse name: Not on file   Number of children: Not on file   Years of education: Not on file   Highest education level: Not on file  Occupational History   Not on file  Tobacco Use   Smoking status: Never   Smokeless tobacco: Former    Types: Chew    Quit date: 09/09/1982  Substance and Sexual Activity   Alcohol use: No   Drug use: No   Sexual activity: Not on file  Other Topics Concern   Not on file  Social History Narrative   Not on file   Social Determinants of Health   Financial Resource Strain: Not on file  Food Insecurity: Not on file  Transportation Needs: Not on file  Physical Activity: Not on file  Stress: Not on file  Social Connections: Not on file  Intimate Partner Violence: Not on file    Family History  Problem Relation Age of Onset   Diabetes Mother    Breast cancer Mother    Heart attack Father    Prostate cancer Neg Hx    Kidney cancer Neg Hx    Bladder Cancer Neg Hx     Allergies  Allergen Reactions   Other     Animal Dandruff Hay     Pollen Extract     Review of Systems  Constitutional: Negative.   HENT: Negative.    Eyes: Negative.   Respiratory: Negative.    Cardiovascular: Negative.   Gastrointestinal: Negative.   Genitourinary: Negative.   Skin:  Negative.   Neurological: Negative.   Endo/Heme/Allergies: Negative.        Objective:   BP 102/79   Pulse 74   Ht 5\' 11"  (1.803 m)   Wt 217 lb 3.2 oz (98.5 kg)   SpO2 98%   BMI 30.29 kg/m   Vitals:   05/30/23 1558  BP: 102/79  Pulse: 74  Height: 5\' 11"  (1.803 m)  Weight: 217 lb 3.2 oz (98.5 kg)  SpO2: 98%  BMI (Calculated): 30.31    Physical Exam Vitals reviewed.  Constitutional:      Appearance: Normal appearance.  HENT:     Head: Normocephalic.     Left Ear: There is no impacted cerumen.     Nose: Nose normal.     Mouth/Throat:     Mouth: Mucous membranes are moist.     Pharynx: No posterior oropharyngeal erythema.  Eyes:     Extraocular Movements: Extraocular movements intact.     Pupils: Pupils are equal, round, and reactive to light.  Cardiovascular:     Rate and Rhythm: Regular rhythm.     Chest Wall: PMI is not displaced.     Pulses:  Normal pulses.     Heart sounds: Normal heart sounds. No murmur heard. Pulmonary:     Effort: Pulmonary effort is normal.     Breath sounds: Normal air entry. No rhonchi or rales.  Abdominal:     General: Abdomen is flat. Bowel sounds are normal. There is no distension.     Palpations: Abdomen is soft. There is no hepatomegaly, splenomegaly or mass.     Tenderness: There is no abdominal tenderness.  Musculoskeletal:        General: Normal range of motion.     Cervical back: Normal range of motion and neck supple.     Right lower leg: No edema.     Left lower leg: No edema.  Skin:    General: Skin is warm and dry.  Neurological:     General: No focal deficit present.     Mental Status: He is alert and oriented to person, place, and time.     Cranial Nerves: No cranial nerve deficit.     Motor: No weakness.  Psychiatric:        Mood and Affect: Mood normal.        Behavior: Behavior normal.      No results found for any visits on 05/30/23.  Recent Results (from the past 2160 hour(Samiel Peel))  Comprehensive  metabolic panel     Status: Abnormal   Collection Time: 05/29/23 10:21 AM  Result Value Ref Range   Glucose 120 (H) 70 - 99 mg/dL   BUN 25 (H) 6 - 24 mg/dL   Creatinine, Ser 1.61 0.76 - 1.27 mg/dL   eGFR 93 >09 UE/AVW/0.98   BUN/Creatinine Ratio 26 (H) 9 - 20   Sodium 139 134 - 144 mmol/L   Potassium 4.1 3.5 - 5.2 mmol/L   Chloride 99 96 - 106 mmol/L   CO2 25 20 - 29 mmol/L   Calcium 8.9 8.7 - 10.2 mg/dL   Total Protein 6.6 6.0 - 8.5 g/dL   Albumin 4.3 3.8 - 4.9 g/dL   Globulin, Total 2.3 1.5 - 4.5 g/dL   Bilirubin Total 0.2 0.0 - 1.2 mg/dL   Alkaline Phosphatase 73 44 - 121 IU/L   AST 64 (H) 0 - 40 IU/L   ALT 57 (H) 0 - 44 IU/L      Assessment & Plan:  As per problem list  Problem List Items Addressed This Visit       Endocrine   Type 2 diabetes mellitus without complication, without long-term current use of insulin (HCC) - Primary    Return in about 1 week (around 06/06/2023) for fu with labs prior.   Total time spent: 20 minutes  Luna Fuse, MD  05/30/2023   This document may have been prepared by [facility] Voice Recognition software and as such may include unintentional dictation errors.

## 2023-06-20 ENCOUNTER — Other Ambulatory Visit: Payer: Self-pay | Admitting: Family

## 2023-06-25 ENCOUNTER — Other Ambulatory Visit: Payer: Self-pay

## 2023-06-26 ENCOUNTER — Other Ambulatory Visit: Payer: Self-pay | Admitting: Neurology

## 2023-06-26 DIAGNOSIS — M4802 Spinal stenosis, cervical region: Secondary | ICD-10-CM

## 2023-06-26 DIAGNOSIS — M542 Cervicalgia: Secondary | ICD-10-CM

## 2023-06-26 DIAGNOSIS — G8929 Other chronic pain: Secondary | ICD-10-CM

## 2023-06-27 ENCOUNTER — Ambulatory Visit: Payer: BC Managed Care – PPO | Admitting: Internal Medicine

## 2023-06-28 ENCOUNTER — Other Ambulatory Visit: Payer: Self-pay | Admitting: Internal Medicine

## 2023-07-03 LAB — HEPATIC FUNCTION PANEL
ALT: 82 [IU]/L — ABNORMAL HIGH (ref 0–44)
AST: 86 [IU]/L — ABNORMAL HIGH (ref 0–40)
Albumin: 4.5 g/dL (ref 3.8–4.9)
Alkaline Phosphatase: 66 [IU]/L (ref 44–121)
Bilirubin Total: 0.4 mg/dL (ref 0.0–1.2)
Bilirubin, Direct: 0.13 mg/dL (ref 0.00–0.40)
Total Protein: 6.9 g/dL (ref 6.0–8.5)

## 2023-07-03 LAB — LIPID PANEL
Chol/HDL Ratio: 2.3 {ratio} (ref 0.0–5.0)
Cholesterol, Total: 129 mg/dL (ref 100–199)
HDL: 56 mg/dL (ref >39)
LDL Chol Calc (NIH): 61 mg/dL (ref 0–99)
Triglycerides: 56 mg/dL (ref 0–149)
VLDL Cholesterol Cal: 12 mg/dL (ref 5–40)

## 2023-07-03 LAB — CK: Total CK: 2530 U/L (ref 41–331)

## 2023-07-04 ENCOUNTER — Ambulatory Visit (INDEPENDENT_AMBULATORY_CARE_PROVIDER_SITE_OTHER): Payer: BC Managed Care – PPO | Admitting: Internal Medicine

## 2023-07-04 ENCOUNTER — Encounter: Payer: Self-pay | Admitting: Internal Medicine

## 2023-07-04 VITALS — BP 122/78 | HR 98 | Ht 71.5 in | Wt 213.6 lb

## 2023-07-04 DIAGNOSIS — E119 Type 2 diabetes mellitus without complications: Secondary | ICD-10-CM

## 2023-07-04 DIAGNOSIS — E782 Mixed hyperlipidemia: Secondary | ICD-10-CM | POA: Diagnosis not present

## 2023-07-04 DIAGNOSIS — R7989 Other specified abnormal findings of blood chemistry: Secondary | ICD-10-CM | POA: Diagnosis not present

## 2023-07-04 DIAGNOSIS — I10 Essential (primary) hypertension: Secondary | ICD-10-CM

## 2023-07-04 LAB — HEMOGLOBIN A1C
Est. average glucose Bld gHb Est-mCnc: 166 mg/dL
Hgb A1c MFr Bld: 7.4 % — ABNORMAL HIGH (ref 4.8–5.6)

## 2023-07-04 MED ORDER — PIOGLITAZONE HCL 45 MG PO TABS
45.0000 mg | ORAL_TABLET | Freq: Every morning | ORAL | 1 refills | Status: DC
Start: 2023-07-04 — End: 2024-06-09

## 2023-07-04 MED ORDER — EMPAGLIFLOZIN 25 MG PO TABS
25.0000 mg | ORAL_TABLET | Freq: Every day | ORAL | 1 refills | Status: DC
Start: 2023-07-04 — End: 2023-09-08

## 2023-07-04 NOTE — Progress Notes (Signed)
Established Patient Office Visit  Subjective:  Patient ID: Micheal Gay, male    DOB: 1966/07/11  Age: 57 y.o. MRN: [id number]  Chief Complaint  Patient presents with   Follow-up    1 week follow up, discuss lab results and referral.     No new complaints, here for lab review and medication refills. Labs reviewed and notable for uncontrolled diabetes, A1c not at target, lipids at target with unremarkable cmp. Denies any hypoglycemic episodes and home bg readings have been at target. Ck elevated but has been exercising strenuously recently.  No other concerns at this time.   Past Medical History:  Diagnosis Date   Bipolar 1 disorder (HCC)    Depression    Diabetes mellitus without complication (HCC)    Keratoconus of right eye    Sepsis (HCC) 09/22/2016   Septicemia (HCC) 09/22/2016    Past Surgical History:  Procedure Laterality Date   EYE SURGERY      Social History   Socioeconomic History   Marital status: Divorced    Spouse name: Not on file   Number of children: Not on file   Years of education: Not on file   Highest education level: Not on file  Occupational History   Not on file  Tobacco Use   Smoking status: Never   Smokeless tobacco: Former    Types: Chew    Quit date: 09/09/1982  Substance and Sexual Activity   Alcohol use: No   Drug use: No   Sexual activity: Not on file  Other Topics Concern   Not on file  Social History Narrative   Not on file   Social Determinants of Health   Financial Resource Strain: Not on file  Food Insecurity: Not on file  Transportation Needs: Not on file  Physical Activity: Not on file  Stress: Not on file  Social Connections: Not on file  Intimate Partner Violence: Not on file    Family History  Problem Relation Age of Onset   Diabetes Mother    Breast cancer Mother    Heart attack Father    Prostate cancer Neg Hx    Kidney cancer Neg Hx    Bladder Cancer Neg Hx     Allergies  Allergen Reactions    Other     Animal Dandruff Hay     Pollen Extract     Review of Systems  Constitutional: Negative.   HENT: Negative.    Eyes: Negative.   Respiratory: Negative.    Cardiovascular: Negative.   Gastrointestinal: Negative.   Genitourinary: Negative.   Skin: Negative.   Neurological: Negative.   Endo/Heme/Allergies: Negative.        Objective:   BP 122/78   Pulse 98   Ht 5' 11.5" (1.816 m)   Wt 213 lb 9.6 oz (96.9 kg)   SpO2 97%   BMI 29.38 kg/m   Vitals:   07/04/23 1518  BP: 122/78  Pulse: 98  Height: 5' 11.5" (1.816 m)  Weight: 213 lb 9.6 oz (96.9 kg)  SpO2: 97%  BMI (Calculated): 29.38    Physical Exam Vitals reviewed.  Constitutional:      Appearance: Normal appearance.  HENT:     Head: Normocephalic.     Left Ear: There is no impacted cerumen.     Nose: Nose normal.     Mouth/Throat:     Mouth: Mucous membranes are moist.     Pharynx: No posterior oropharyngeal erythema.  Eyes:  Extraocular Movements: Extraocular movements intact.     Pupils: Pupils are equal, round, and reactive to light.  Cardiovascular:     Rate and Rhythm: Regular rhythm.     Chest Wall: PMI is not displaced.     Pulses: Normal pulses.     Heart sounds: Normal heart sounds. No murmur heard. Pulmonary:     Effort: Pulmonary effort is normal.     Breath sounds: Normal air entry. No rhonchi or rales.  Abdominal:     General: Abdomen is flat. Bowel sounds are normal. There is no distension.     Palpations: Abdomen is soft. There is no hepatomegaly, splenomegaly or mass.     Tenderness: There is no abdominal tenderness.  Musculoskeletal:        General: Normal range of motion.     Cervical back: Normal range of motion and neck supple.     Right lower leg: No edema.     Left lower leg: No edema.  Skin:    General: Skin is warm and dry.  Neurological:     General: No focal deficit present.     Mental Status: He is alert and oriented to person, place, and time.     Cranial  Nerves: No cranial nerve deficit.     Motor: No weakness.  Psychiatric:        Mood and Affect: Mood normal.        Behavior: Behavior normal.      No results found for any visits on 07/04/23.  Recent Results (from the past 2160 hour(Joselyn Edling))  Comprehensive metabolic panel     Status: Abnormal   Collection Time: 05/29/23 10:21 AM  Result Value Ref Range   Glucose 120 (H) 70 - 99 mg/dL   BUN 25 (H) 6 - 24 mg/dL   Creatinine, Ser 1.61 0.76 - 1.27 mg/dL   eGFR 93 >09 UE/AVW/0.98   BUN/Creatinine Ratio 26 (H) 9 - 20   Sodium 139 134 - 144 mmol/L   Potassium 4.1 3.5 - 5.2 mmol/L   Chloride 99 96 - 106 mmol/L   CO2 25 20 - 29 mmol/L   Calcium 8.9 8.7 - 10.2 mg/dL   Total Protein 6.6 6.0 - 8.5 g/dL   Albumin 4.3 3.8 - 4.9 g/dL   Globulin, Total 2.3 1.5 - 4.5 g/dL   Bilirubin Total 0.2 0.0 - 1.2 mg/dL   Alkaline Phosphatase 73 44 - 121 IU/L   AST 64 (H) 0 - 40 IU/L   ALT 57 (H) 0 - 44 IU/L  Lipid panel     Status: None   Collection Time: 07/03/23 10:09 AM  Result Value Ref Range   Cholesterol, Total 129 100 - 199 mg/dL   Triglycerides 56 0 - 149 mg/dL   HDL 56 >11 mg/dL   VLDL Cholesterol Cal 12 5 - 40 mg/dL   LDL Chol Calc (NIH) 61 0 - 99 mg/dL   Chol/HDL Ratio 2.3 0.0 - 5.0 ratio    Comment:                                   T. Chol/HDL Ratio                                             Men  Women  1/2 Avg.Risk  3.4    3.3                                   Avg.Risk  5.0    4.4                                2X Avg.Risk  9.6    7.1                                3X Avg.Risk 23.4   11.0   CK (Creatine Kinase)     Status: Abnormal   Collection Time: 07/03/23 10:12 AM  Result Value Ref Range   Total CK 2,530 (HH) 41 - 331 U/L    Comment: Results confirmed on dilution.   Hemoglobin A1c     Status: Abnormal   Collection Time: 07/03/23 10:13 AM  Result Value Ref Range   Hgb A1c MFr Bld 7.4 (H) 4.8 - 5.6 %    Comment:          Prediabetes: 5.7 -  6.4          Diabetes: >6.4          Glycemic control for adults with diabetes: <7.0    Est. average glucose Bld gHb Est-mCnc 166 mg/dL  Hepatic function panel     Status: Abnormal   Collection Time: 07/03/23 10:13 AM  Result Value Ref Range   Total Protein 6.9 6.0 - 8.5 g/dL   Albumin 4.5 3.8 - 4.9 g/dL   Bilirubin Total 0.4 0.0 - 1.2 mg/dL   Bilirubin, Direct 4.09 0.00 - 0.40 mg/dL   Alkaline Phosphatase 66 44 - 121 IU/L   AST 86 (H) 0 - 40 IU/L   ALT 82 (H) 0 - 44 IU/L      Assessment & Plan:  As per problem list  Problem List Items Addressed This Visit       Cardiovascular and Mediastinum   Essential hypertension     Endocrine   Type 2 diabetes mellitus without complication, without long-term current use of insulin (HCC) - Primary   Relevant Medications   empagliflozin (JARDIANCE) 25 MG TABS tablet   pioglitazone (ACTOS) 45 MG tablet     Other   Hyperlipidemia   Other Visit Diagnoses     Abnormal liver function test       Relevant Orders   US Abdomen Limited       Return in about 6 weeks (around 08/15/2023) for fu with labs prior.   Total time spent: 20 minutes  Luna Fuse, MD  07/04/2023   This document may have been prepared by [facility] Voice Recognition software and as such may include unintentional dictation errors.

## 2023-07-10 ENCOUNTER — Encounter: Payer: Self-pay | Admitting: Neurology

## 2023-07-11 ENCOUNTER — Ambulatory Visit: Payer: BC Managed Care – PPO

## 2023-07-11 DIAGNOSIS — K2100 Gastro-esophageal reflux disease with esophagitis, without bleeding: Secondary | ICD-10-CM | POA: Diagnosis not present

## 2023-07-11 DIAGNOSIS — K640 First degree hemorrhoids: Secondary | ICD-10-CM | POA: Diagnosis not present

## 2023-07-11 DIAGNOSIS — K635 Polyp of colon: Secondary | ICD-10-CM | POA: Diagnosis not present

## 2023-07-11 DIAGNOSIS — K222 Esophageal obstruction: Secondary | ICD-10-CM | POA: Diagnosis not present

## 2023-07-11 DIAGNOSIS — Z1211 Encounter for screening for malignant neoplasm of colon: Secondary | ICD-10-CM | POA: Diagnosis present

## 2023-07-12 ENCOUNTER — Ambulatory Visit: Payer: BC Managed Care – PPO

## 2023-07-16 ENCOUNTER — Ambulatory Visit (INDEPENDENT_AMBULATORY_CARE_PROVIDER_SITE_OTHER): Payer: BC Managed Care – PPO

## 2023-07-16 ENCOUNTER — Ambulatory Visit
Admission: RE | Admit: 2023-07-16 | Discharge: 2023-07-16 | Disposition: A | Discharge status: Home / Self Care | Payer: BC Managed Care – PPO | Source: Ambulatory Visit | Attending: Neurology | Admitting: Neurology

## 2023-07-16 DIAGNOSIS — R7989 Other specified abnormal findings of blood chemistry: Secondary | ICD-10-CM | POA: Diagnosis not present

## 2023-07-16 DIAGNOSIS — G8929 Other chronic pain: Secondary | ICD-10-CM

## 2023-07-16 DIAGNOSIS — M542 Cervicalgia: Secondary | ICD-10-CM

## 2023-07-16 DIAGNOSIS — M4802 Spinal stenosis, cervical region: Secondary | ICD-10-CM

## 2023-08-03 ENCOUNTER — Other Ambulatory Visit: Payer: Self-pay | Admitting: Internal Medicine

## 2023-08-18 ENCOUNTER — Other Ambulatory Visit: Payer: Self-pay | Admitting: Internal Medicine

## 2023-08-18 ENCOUNTER — Ambulatory Visit: Payer: BC Managed Care – PPO | Admitting: Internal Medicine

## 2023-08-22 ENCOUNTER — Ambulatory Visit: Payer: BC Managed Care – PPO | Admitting: Internal Medicine

## 2023-09-01 ENCOUNTER — Other Ambulatory Visit: Payer: Self-pay | Admitting: Internal Medicine

## 2023-09-05 ENCOUNTER — Other Ambulatory Visit: Payer: Self-pay

## 2023-09-05 ENCOUNTER — Ambulatory Visit (INDEPENDENT_AMBULATORY_CARE_PROVIDER_SITE_OTHER): Payer: BC Managed Care – PPO | Admitting: Internal Medicine

## 2023-09-05 ENCOUNTER — Encounter: Payer: Self-pay | Admitting: Internal Medicine

## 2023-09-05 VITALS — BP 132/80 | HR 81 | Ht 71.5 in | Wt 224.6 lb

## 2023-09-05 DIAGNOSIS — I10 Essential (primary) hypertension: Secondary | ICD-10-CM | POA: Diagnosis not present

## 2023-09-05 DIAGNOSIS — E782 Mixed hyperlipidemia: Secondary | ICD-10-CM

## 2023-09-05 DIAGNOSIS — E119 Type 2 diabetes mellitus without complications: Secondary | ICD-10-CM

## 2023-09-05 DIAGNOSIS — Z23 Encounter for immunization: Secondary | ICD-10-CM

## 2023-09-05 LAB — GLUCOSE, POCT (MANUAL RESULT ENTRY): POC Glucose: 134 mg/dL — AB (ref 70–99)

## 2023-09-05 MED ORDER — ATORVASTATIN CALCIUM 20 MG PO TABS: 20.0000 mg | ORAL_TABLET | Freq: Every evening | ORAL | 0 refills | Status: DC

## 2023-09-05 NOTE — Addendum Note (Signed)
Addended by: Feliberto Harts on: 09/05/2023 11:44 AM   Modules accepted: Orders

## 2023-09-05 NOTE — Progress Notes (Signed)
Established Patient Office Visit  Subjective:  Patient ID: Micheal Gay, male    DOB: 06/15/1966  Age: 57 y.o. MRN: [id number]  No chief complaint on file.   No new complaints, here for lab review and medication refills. Failed to have previsit labs done and doesn't check his bg at home despite gaining 12 lbs since his last visit.      No other concerns at this time.   Past Medical History:  Diagnosis Date   Bipolar 1 disorder (HCC)    Depression    Diabetes mellitus without complication (HCC)    Keratoconus of right eye    Sepsis (HCC) 09/22/2016   Septicemia (HCC) 09/22/2016    Past Surgical History:  Procedure Laterality Date   EYE SURGERY      Social History   Socioeconomic History   Marital status: Divorced    Spouse name: Not on file   Number of children: Not on file   Years of education: Not on file   Highest education level: Not on file  Occupational History   Not on file  Tobacco Use   Smoking status: Never   Smokeless tobacco: Former    Types: Chew    Quit date: 09/09/1982  Substance and Sexual Activity   Alcohol use: No   Drug use: No   Sexual activity: Not on file  Other Topics Concern   Not on file  Social History Narrative   Not on file   Social Drivers of Health   Financial Resource Strain: Not on file  Food Insecurity: Not on file  Transportation Needs: Not on file  Physical Activity: Not on file  Stress: Not on file  Social Connections: Not on file  Intimate Partner Violence: Not on file    Family History  Problem Relation Age of Onset   Diabetes Mother    Breast cancer Mother    Heart attack Father    Prostate cancer Neg Hx    Kidney cancer Neg Hx    Bladder Cancer Neg Hx     Allergies  Allergen Reactions   Other     Animal Dandruff Hay     Pollen Extract     Outpatient Medications Prior to Visit  Medication Sig   amitriptyline (ELAVIL) 10 MG tablet TAKE 1 TABLET BY MOUTH AT BEDTIME   clonazePAM (KLONOPIN)  0.5 MG tablet Take 0.5 mg by mouth 3 (three) times daily as needed.   gabapentin (NEURONTIN) 400 MG capsule Take 400 mg by mouth 2 (two) times daily.   indomethacin (INDOCIN) 50 MG capsule TAKE 1 CAPSULE BY MOUTH THREE TIMES DAILY AS NEEDED FOR HEADACHE   metFORMIN (GLUCOPHAGE) 1000 MG tablet Take 1 tablet by mouth twice daily   ondansetron (ZOFRAN-ODT) 4 MG disintegrating tablet DISSOLVE 1 TABLET IN MOUTH EVERY 8 HOURS AS NEEDED FOR NAUSEA FOR VOMITING   pioglitazone (ACTOS) 45 MG tablet Take 1 tablet (45 mg total) by mouth every morning.   Rimegepant Sulfate (NURTEC) 75 MG TBDP Take 1 tablet (75 mg total) by mouth daily as needed (headache).   sildenafil (VIAGRA) 100 MG tablet TAKE ONE-HALF TO ONE TABLET BY MOUTH 30 MINUTES TO 4 HOURS BEFORE INTERCOURSE AS DIRECTED   Zavegepant HCl (ZAVZPRET) 10 MG/ACT SOLN Place 1 spray into the nose daily.   [DISCONTINUED] atorvastatin (LIPITOR) 20 MG tablet TAKE 1 TABLET BY MOUTH ONCE DAILY IN THE EVENING . APPOINTMENT REQUIRED FOR FUTURE REFILLS   clonazePAM (KLONOPIN) 0.5 MG tablet Take 0.5 mg  by mouth 2 (two) times daily. (Patient not taking: Reported on 09/05/2023)   lidocaine (XYLOCAINE) 2 % solution USE AS DIRECTED IN THE MOUTH OR THROAT EVERY 6 HOURS AS NEEDED FOR MOUTH PAIN/GARGLE (Patient not taking: Reported on 09/05/2023)   promethazine (PHENERGAN) 12.5 MG tablet Take 1 tablet (12.5 mg total) by mouth every 6 (six) hours as needed for up to 7 days for nausea or vomiting. (Patient not taking: Reported on 09/05/2023)   No facility-administered medications prior to visit.    Review of Systems  Constitutional:  Negative for weight loss (gained 12 lbs).       Objective:   BP 132/80   Pulse 81   Ht 5' 11.5" (1.816 m)   Wt 224 lb 9.6 oz (101.9 kg)   SpO2 96%   BMI 30.89 kg/m   Vitals:   09/05/23 1046  BP: 132/80  Pulse: 81  Height: 5' 11.5" (1.816 m)  Weight: 224 lb 9.6 oz (101.9 kg)  SpO2: 96%  BMI (Calculated): 30.89     Physical Exam Vitals reviewed.  Constitutional:      Appearance: Normal appearance.  HENT:     Head: Normocephalic.     Left Ear: There is no impacted cerumen.     Nose: Nose normal.     Mouth/Throat:     Mouth: Mucous membranes are moist.     Pharynx: No posterior oropharyngeal erythema.  Eyes:     Extraocular Movements: Extraocular movements intact.     Pupils: Pupils are equal, round, and reactive to light.  Cardiovascular:     Rate and Rhythm: Regular rhythm.     Chest Wall: PMI is not displaced.     Pulses: Normal pulses.     Heart sounds: Normal heart sounds. No murmur heard. Pulmonary:     Effort: Pulmonary effort is normal.     Breath sounds: Normal air entry. No rhonchi or rales.  Abdominal:     General: Abdomen is flat. Bowel sounds are normal. There is no distension.     Palpations: Abdomen is soft. There is no hepatomegaly, splenomegaly or mass.     Tenderness: There is no abdominal tenderness.  Musculoskeletal:        General: Normal range of motion.     Cervical back: Normal range of motion and neck supple.     Right lower leg: No edema.     Left lower leg: No edema.  Skin:    General: Skin is warm and dry.  Neurological:     General: No focal deficit present.     Mental Status: He is alert and oriented to person, place, and time.     Cranial Nerves: No cranial nerve deficit.     Motor: No weakness.  Psychiatric:        Mood and Affect: Mood normal.        Behavior: Behavior normal.      Results for orders placed or performed in visit on 09/05/23  POCT Glucose (CBG)  Result Value Ref Range   POC Glucose 134 (A) 70 - 99 mg/dl    Recent Results (from the past 2160 hours)  Lipid panel     Status: None   Collection Time: 07/03/23 10:09 AM  Result Value Ref Range   Cholesterol, Total 129 100 - 199 mg/dL   Triglycerides 56 0 - 149 mg/dL   HDL 56 >16 mg/dL   VLDL Cholesterol Cal 12 5 - 40 mg/dL   LDL Chol Calc (  NIH) 61 0 - 99 mg/dL   Chol/HDL  Ratio 2.3 0.0 - 5.0 ratio    Comment:                                   T. Chol/HDL Ratio                                             Men  Women                               1/2 Avg.Risk  3.4    3.3                                   Avg.Risk  5.0    4.4                                2X Avg.Risk  9.6    7.1                                3X Avg.Risk 23.4   11.0   CK (Creatine Kinase)     Status: Abnormal   Collection Time: 07/03/23 10:12 AM  Result Value Ref Range   Total CK 2,530 (HH) 41 - 331 U/L    Comment: Results confirmed on dilution.   Hemoglobin A1c     Status: Abnormal   Collection Time: 07/03/23 10:13 AM  Result Value Ref Range   Hgb A1c MFr Bld 7.4 (H) 4.8 - 5.6 %    Comment:          Prediabetes: 5.7 - 6.4          Diabetes: >6.4          Glycemic control for adults with diabetes: <7.0    Est. average glucose Bld gHb Est-mCnc 166 mg/dL  Hepatic function panel     Status: Abnormal   Collection Time: 07/03/23 10:13 AM  Result Value Ref Range   Total Protein 6.9 6.0 - 8.5 g/dL   Albumin 4.5 3.8 - 4.9 g/dL   Bilirubin Total 0.4 0.0 - 1.2 mg/dL   Bilirubin, Direct 2.72 0.00 - 0.40 mg/dL   Alkaline Phosphatase 66 44 - 121 IU/L   AST 86 (H) 0 - 40 IU/L   ALT 82 (H) 0 - 44 IU/L  POCT Glucose (CBG)     Status: Abnormal   Collection Time: 09/05/23 10:54 AM  Result Value Ref Range   POC Glucose 134 (A) 70 - 99 mg/dl      Assessment & Plan:  As per problem list. Stricter low calorie diet, low cholesterol and low fat diet and exercise as much as possible. Problem List Items Addressed This Visit       Cardiovascular and Mediastinum   Essential hypertension   Relevant Medications   atorvastatin (LIPITOR) 20 MG tablet     Endocrine   Type 2 diabetes mellitus without complication, without long-term current use of insulin (HCC) - Primary   Relevant Medications   atorvastatin (LIPITOR) 20 MG tablet   Other  Relevant Orders   POCT Glucose (CBG) (Completed)    Hemoglobin A1c   Lipid panel     Other   Hyperlipidemia   Relevant Medications   atorvastatin (LIPITOR) 20 MG tablet    Return in about 5 weeks (around 10/10/2023) for fu with labs prior.   Total time spent: 20 minutes  Luna Fuse, MD  09/05/2023   This document may have been prepared by [facility] Voice Recognition software and as such may include unintentional dictation errors.

## 2023-09-06 ENCOUNTER — Other Ambulatory Visit: Payer: Self-pay | Admitting: Internal Medicine

## 2023-09-06 DIAGNOSIS — E119 Type 2 diabetes mellitus without complications: Secondary | ICD-10-CM

## 2023-09-10 ENCOUNTER — Other Ambulatory Visit: Payer: Self-pay | Admitting: Internal Medicine

## 2023-10-07 NOTE — Progress Notes (Unsigned)
Patient: Micheal Gay  Service Category: E/M  Provider: Oswaldo Done, MD  DOB: 04-28-1966  DOS: 10/08/2023  Referring Provider: Morene Crocker, MD  MRN: [id number]  Setting: Ambulatory outpatient  PCP: Sherron Monday, MD  Type: New Patient  Specialty: Interventional Pain Management    Location: Office  Delivery: Face-to-face     Primary Reason(s) for Visit: Encounter for initial evaluation of one or more chronic problems (new to examiner) potentially causing chronic pain, and posing a threat to normal musculoskeletal function. (Level of risk: High) CC: No chief complaint on file.  HPI  [name] is a 58 y.o. year old, male patient, who comes for the first time to our practice referred by Morene Crocker, MD for our initial evaluation of his chronic pain. He has Hyperlipidemia; Depression; Bipolar I disorder (HCC); Obesity; Keratoconus of both eyes; Essential hypertension; Esophageal reflux; Tension headache; and Type 2 diabetes mellitus without complication, without long-term current use of insulin (HCC) on their problem list. Today he comes in for evaluation of his No chief complaint on file.  Pain Assessment: Location:     Radiating:   Onset:   Duration:   Quality:   Severity:  /10 (subjective, self-reported pain score)  Effect on ADL:   Timing:   Modifying factors:   BP:    HR:    Onset and Duration: {Hx; Onset and Duration:210120511} Cause of pain: {Hx; Cause:210120521} Severity: {Pain Severity:210120502} Timing: {Symptoms; Timing:210120501} Aggravating Factors: {Causes; Aggravating pain factors:210120507} Alleviating Factors: {Causes; Alleviating Factors:210120500} Associated Problems: {Hx; Associated problems:210120515} Quality of Pain: {Hx; Symptom quality or Descriptor:210120531} Previous Examinations or Tests: {Hx; Previous examinations or test:210120529} Previous Treatments: {Hx; Previous Treatment:210120503}  [name] is being evaluated for  possible interventional pain management therapies for the treatment of his chronic pain.  Discussed the use of AI scribe software for clinical note transcription with the patient, who gave verbal consent to proceed.  History of Present Illness           *** [name] has been informed that this initial visit was an evaluation only.  On the follow up appointment I will go over the results, including ordered tests and available interventional therapies. At that time he will have the opportunity to decide whether to proceed with offered therapies or not. In the event that Mr. Pratte prefers avoiding interventional options, this will conclude our involvement in the case.  Medication management recommendations may be provided upon request.  Patient informed that diagnostic tests may be ordered to assist in identifying underlying causes, narrow the list of differential diagnoses and aid in determining candidacy for (or contraindications to) planned therapeutic interventions.  Historic Controlled Substance Pharmacotherapy Review  PMP and historical list of controlled substances: ***  Most recently prescribed opioid analgesics:   *** MME/day: *** mg/day  Historical Monitoring: The patient  reports no history of drug use. List of prior UDS Testing: Lab Results  Component Value Date   MDMA NONE DETECTED 12/22/2022   COCAINSCRNUR NONE DETECTED 12/22/2022   PCPSCRNUR NONE DETECTED 12/22/2022   THCU NONE DETECTED 12/22/2022   Historical Background Evaluation: [place] PMP: PDMP reviewed during this encounter. Review of the past 21-months conducted.             PMP NARX Score Report:  Narcotic: *** Sedative: *** Stimulant: ***  Department of public safety, offender search: Engineer, mining Information) Non-contributory Risk Assessment Profile: Aberrant behavior: None observed or detected today Risk factors for fatal opioid overdose: None identified  today PMP NARX Overdose Risk Score: *** Fatal  overdose hazard ratio (HR): Calculation deferred Non-fatal overdose hazard ratio (HR): Calculation deferred Risk of opioid abuse or dependence: 0.7-3.0% with doses <= 36 MME/day and 6.1-26% with doses >= 120 MME/day. Substance use disorder (SUD) risk level: See below Personal History of Substance Abuse (SUD-Substance use disorder):  Alcohol:    Illegal Drugs:    Rx Drugs:    ORT Risk Level calculation:    ORT Scoring interpretation table:  Score <3 = Low Risk for SUD  Score between 4-7 = Moderate Risk for SUD  Score >8 = High Risk for Opioid Abuse   PHQ-2 Depression Scale:  Total score:    PHQ-2 Scoring interpretation table: (Score and probability of major depressive disorder)  Score 0 = No depression  Score 1 = 15.4% Probability  Score 2 = 21.1% Probability  Score 3 = 38.4% Probability  Score 4 = 45.5% Probability  Score 5 = 56.4% Probability  Score 6 = 78.6% Probability   PHQ-9 Depression Scale:  Total score:    PHQ-9 Scoring interpretation table:  Score 0-4 = No depression  Score 5-9 = Mild depression  Score 10-14 = Moderate depression  Score 15-19 = Moderately severe depression  Score 20-27 = Severe depression (2.4 times higher risk of SUD and 2.89 times higher risk of overuse)   Pharmacologic Plan: As per protocol, I have not taken over any controlled substance management, pending the results of ordered tests and/or consults.            Initial impression: Pending review of available data and ordered tests.  Meds   Current Outpatient Medications:    amitriptyline (ELAVIL) 10 MG tablet, TAKE 1 TABLET BY MOUTH AT BEDTIME, Disp: 30 tablet, Rfl: 1   atorvastatin (LIPITOR) 20 MG tablet, Take 1 tablet (20 mg total) by mouth at bedtime. TAKE 1 TABLET BY MOUTH ONCE DAILY IN THE EVENING . APPOINTMENT REQUIRED FOR FUTURE REFILLS, Disp: 90 tablet, Rfl: 0   clonazePAM (KLONOPIN) 0.5 MG tablet, Take 0.5 mg by mouth 2 (two) times daily. (Patient not taking: Reported on 09/05/2023),  Disp: , Rfl:    clonazePAM (KLONOPIN) 0.5 MG tablet, Take 0.5 mg by mouth 3 (three) times daily as needed., Disp: , Rfl:    empagliflozin (JARDIANCE) 25 MG TABS tablet, TAKE 1 TABLET BY MOUTH ONCE DAILY BEFORE BREAKFAST, Disp: 30 tablet, Rfl: 1   gabapentin (NEURONTIN) 400 MG capsule, Take 400 mg by mouth 2 (two) times daily., Disp: , Rfl:    indomethacin (INDOCIN) 50 MG capsule, TAKE 1 CAPSULE BY MOUTH THREE TIMES DAILY AS NEEDED FOR HEADACHE, Disp: 90 capsule, Rfl: 1   lidocaine (XYLOCAINE) 2 % solution, USE AS DIRECTED IN THE MOUTH OR THROAT EVERY 6 HOURS AS NEEDED FOR MOUTH PAIN/GARGLE (Patient not taking: Reported on 09/05/2023), Disp: 300 mL, Rfl: 0   metFORMIN (GLUCOPHAGE) 1000 MG tablet, Take 1 tablet by mouth twice daily, Disp: 180 tablet, Rfl: 0   ondansetron (ZOFRAN-ODT) 4 MG disintegrating tablet, DISSOLVE 1 TABLET IN MOUTH EVERY 8 HOURS AS NEEDED FOR NAUSEA FOR VOMITING, Disp: 20 tablet, Rfl: 0   pioglitazone (ACTOS) 45 MG tablet, Take 1 tablet (45 mg total) by mouth every morning., Disp: 90 tablet, Rfl: 1   promethazine (PHENERGAN) 12.5 MG tablet, Take 1 tablet (12.5 mg total) by mouth every 6 (six) hours as needed for up to 7 days for nausea or vomiting. (Patient not taking: Reported on 09/05/2023), Disp: 20 tablet, Rfl:  0   Rimegepant Sulfate (NURTEC) 75 MG TBDP, Take 1 tablet (75 mg total) by mouth daily as needed (headache)., Disp: 16 tablet, Rfl: 1   sildenafil (VIAGRA) 100 MG tablet, TAKE ONE-HALF TO ONE TABLET BY MOUTH 30 MINUTES TO 4 HOURS BEFORE INTERCOURSE AS DIRECTED, Disp: 30 tablet, Rfl: 3   Zavegepant HCl (ZAVZPRET) 10 MG/ACT SOLN, Place 1 spray into the nose daily., Disp: 6 each, Rfl: 3  Imaging Review  Cervical Imaging: Cervical MR wo contrast: Results for orders placed during the hospital encounter of 07/16/23  MR CERVICAL SPINE WO CONTRAST  Narrative CLINICAL DATA:  Chronic neck pain.  Cervicalgia.  EXAM: MRI CERVICAL SPINE WITHOUT  CONTRAST  TECHNIQUE: Multiplanar, multisequence MR imaging of the cervical spine was performed. No intravenous contrast was administered.  COMPARISON:  None Available.  FINDINGS: Alignment: Physiologic.  Vertebrae: No fracture, evidence of discitis, or bone lesion. Incomplete segmentation at C2-3. degenerative marrow edema at C4-5.  Cord: Normal signal and morphology.  Posterior Fossa, vertebral arteries, paraspinal tissues: No evidence of perispinal mass or inflammation.  Disc levels:  C2-3: Incomplete segmentation.  No impingement.  C3-4: Disc space narrowing with mild bulging.  C4-5: Disc collapse with endplate and uncovertebral ridging. Biforaminal impingement. Bulging disc and ridging mildly indents the ventral cord.  C5-6: Disc space narrowing and bulging with right foraminal uncovertebral spur. Moderate bilateral foraminal narrowing greater on the right. Patent spinal canal.  C6-7: Disc collapse with endplate and uncovertebral ridging. Biforaminal impingement. Patent spinal canal.  C7-T1:Mild facet spurring.  IMPRESSION: 1. Generalized cervical spine degeneration with notable Modic 1 changes at C4-5. 2. Biforaminal impingement at C4-5 to C6-7, least affecting C5-6. 3. Up to mild spinal stenosis at C4-5.   Electronically Signed By: Tiburcio Pea M.D. On: 07/23/2023 10:36  Cervical MR wo contrast: No results found for this or any previous visit.  Cervical MR w/wo contrast: No results found for this or any previous visit.  Cervical MR w contrast: No results found for this or any previous visit.  Cervical CT wo contrast: No results found for this or any previous visit.  Cervical CT w/wo contrast: No results found for this or any previous visit.  Cervical CT w/wo contrast: No results found for this or any previous visit.  Cervical CT w contrast: No results found for this or any previous visit.  Cervical CT outside: No results found for this or any  previous visit.  Cervical DG 1 view: No results found for this or any previous visit.  Cervical DG 2-3 views: No results found for this or any previous visit.  Cervical DG F/E views: No results found for this or any previous visit.  Cervical DG 2-3 clearing views: No results found for this or any previous visit.  Cervical DG Bending/F/E views: No results found for this or any previous visit.  Cervical DG complete: No results found for this or any previous visit.  Cervical DG Myelogram views: No results found for this or any previous visit.  Cervical DG Myelogram views: No results found for this or any previous visit.  Cervical Discogram views: No results found for this or any previous visit.   Shoulder Imaging: Shoulder-R MR w contrast: No results found for this or any previous visit.  Shoulder-L MR w contrast: No results found for this or any previous visit.  Shoulder-R MR w/wo contrast: No results found for this or any previous visit.  Shoulder-L MR w/wo contrast: No results found for this or any  previous visit.  Shoulder-R MR wo contrast: No results found for this or any previous visit.  Shoulder-L MR wo contrast: No results found for this or any previous visit.  Shoulder-R CT w contrast: No results found for this or any previous visit.  Shoulder-L CT w contrast: No results found for this or any previous visit.  Shoulder-R CT w/wo contrast: No results found for this or any previous visit.  Shoulder-L CT w/wo contrast: No results found for this or any previous visit.  Shoulder-R CT wo contrast: No results found for this or any previous visit.  Shoulder-L CT wo contrast: No results found for this or any previous visit.  Shoulder-R DG Arthrogram: No results found for this or any previous visit.  Shoulder-L DG Arthrogram: No results found for this or any previous visit.  Shoulder-R DG 1 view: No results found for this or any previous visit.  Shoulder-L DG 1 view: No  results found for this or any previous visit.  Shoulder-R DG: No results found for this or any previous visit.  Shoulder-L DG: No results found for this or any previous visit.   Thoracic Imaging: Thoracic MR wo contrast: No results found for this or any previous visit.  Thoracic MR wo contrast: No results found for this or any previous visit.  Thoracic MR w/wo contrast: No results found for this or any previous visit.  Thoracic MR w contrast: No results found for this or any previous visit.  Thoracic CT wo contrast: No results found for this or any previous visit.  Thoracic CT w/wo contrast: No results found for this or any previous visit.  Thoracic CT w/wo contrast: No results found for this or any previous visit.  Thoracic CT w contrast: No results found for this or any previous visit.  Thoracic DG 2-3 views: No results found for this or any previous visit.  Thoracic DG 4 views: No results found for this or any previous visit.  Thoracic DG: No results found for this or any previous visit.  Thoracic DG w/swimmers view: No results found for this or any previous visit.  Thoracic DG Myelogram views: No results found for this or any previous visit.  Thoracic DG Myelogram views: No results found for this or any previous visit.   Lumbosacral Imaging: Lumbar MR wo contrast: No results found for this or any previous visit.  Lumbar MR wo contrast: No results found for this or any previous visit.  Lumbar MR w/wo contrast: No results found for this or any previous visit.  Lumbar MR w/wo contrast: No results found for this or any previous visit.  Lumbar MR w contrast: No results found for this or any previous visit.  Lumbar CT wo contrast: No results found for this or any previous visit.  Lumbar CT w/wo contrast: No results found for this or any previous visit.  Lumbar CT w/wo contrast: No results found for this or any previous visit.  Lumbar CT w contrast: No results found  for this or any previous visit.  Lumbar DG 1V: No results found for this or any previous visit.  Lumbar DG 1V (Clearing): No results found for this or any previous visit.  Lumbar DG 2-3V (Clearing): No results found for this or any previous visit.  Lumbar DG 2-3 views: No results found for this or any previous visit.  Lumbar DG (Complete) 4+V: No results found for this or any previous visit.        Lumbar DG F/E views:  No results found for this or any previous visit.        Lumbar DG Bending views: No results found for this or any previous visit.        Lumbar DG Myelogram views: No results found for this or any previous visit.  Lumbar DG Myelogram: No results found for this or any previous visit.  Lumbar DG Myelogram: No results found for this or any previous visit.  Lumbar DG Myelogram: No results found for this or any previous visit.  Lumbar DG Myelogram Lumbosacral: No results found for this or any previous visit.  Lumbar DG Diskogram views: No results found for this or any previous visit.  Lumbar DG Diskogram views: No results found for this or any previous visit.  Lumbar DG Epidurogram OP: No results found for this or any previous visit.  Lumbar DG Epidurogram IP: No results found for this or any previous visit.   Sacroiliac Joint Imaging: Sacroiliac Joint DG: No results found for this or any previous visit.  Sacroiliac Joint MR w/wo contrast: No results found for this or any previous visit.  Sacroiliac Joint MR wo contrast: No results found for this or any previous visit.   Spine Imaging: Whole Spine DG Myelogram views: No results found for this or any previous visit.  Whole Spine MR Mets screen: No results found for this or any previous visit.  Whole Spine MR Mets screen: No results found for this or any previous visit.  Whole Spine MR w/wo: No results found for this or any previous visit.  MRA Spinal Canal w/ cm: No results found for this or any previous  visit.  MRA Spinal Canal wo/ cm: No results found for this or any previous visit.  MRA Spinal Canal w/wo cm: No results found for this or any previous visit.  Spine Outside MR Films: No results found for this or any previous visit.  Spine Outside CT Films: No results found for this or any previous visit.  CT-Guided Biopsy: No results found for this or any previous visit.  CT-Guided Needle Placement: No results found for this or any previous visit.  DG Spine outside: No results found for this or any previous visit.  IR Spine outside: No results found for this or any previous visit.  NM Spine outside: No results found for this or any previous visit.   Hip Imaging: Hip-R MR w contrast: No results found for this or any previous visit.  Hip-L MR w contrast: No results found for this or any previous visit.  Hip-R MR w/wo contrast: No results found for this or any previous visit.  Hip-L MR w/wo contrast: No results found for this or any previous visit.  Hip-R MR wo contrast: No results found for this or any previous visit.  Hip-L MR wo contrast: No results found for this or any previous visit.  Hip-R CT w contrast: No results found for this or any previous visit.  Hip-L CT w contrast: No results found for this or any previous visit.  Hip-R CT w/wo contrast: No results found for this or any previous visit.  Hip-L CT w/wo contrast: No results found for this or any previous visit.  Hip-R CT wo contrast: No results found for this or any previous visit.  Hip-L CT wo contrast: No results found for this or any previous visit.  Hip-R DG 2-3 views: No results found for this or any previous visit.  Hip-L DG 2-3 views: No results found for this  or any previous visit.  Hip-R DG Arthrogram: No results found for this or any previous visit.  Hip-L DG Arthrogram: No results found for this or any previous visit.  Hip-B DG Bilateral: No results found for this or any previous  visit.  Hip-B DG Bilateral (5V): No results found for this or any previous visit.   Knee Imaging: Knee-R MR w contrast: No results found for this or any previous visit.  Knee-L MR w/o contrast: No results found for this or any previous visit.  Knee-R MR w/wo contrast: No results found for this or any previous visit.  Knee-L MR w/wo contrast: No results found for this or any previous visit.  Knee-R MR wo contrast: No results found for this or any previous visit.  Knee-L MR wo contrast: No results found for this or any previous visit.  Knee-R CT w contrast: No results found for this or any previous visit.  Knee-L CT w contrast: No results found for this or any previous visit.  Knee-R CT w/wo contrast: No results found for this or any previous visit.  Knee-L CT w/wo contrast: No results found for this or any previous visit.  Knee-R CT wo contrast: No results found for this or any previous visit.  Knee-L CT wo contrast: No results found for this or any previous visit.  Knee-R DG 1-2 views: No results found for this or any previous visit.  Knee-L DG 1-2 views: No results found for this or any previous visit.  Knee-R DG 3 views: No results found for this or any previous visit.  Knee-L DG 3 views: No results found for this or any previous visit.  Knee-R DG 4 views: No results found for this or any previous visit.  Knee-L DG 4 views: No results found for this or any previous visit.  Knee-R DG Arthrogram: No results found for this or any previous visit.  Knee-L DG Arthrogram: No results found for this or any previous visit.   Ankle Imaging: Ankle-R DG Complete: No results found for this or any previous visit.  Ankle-L DG Complete: No results found for this or any previous visit.   Foot Imaging: Foot-R DG Complete: No results found for this or any previous visit.  Foot-L DG Complete: No results found for this or any previous visit.   Elbow Imaging: Elbow-R DG  Complete: No results found for this or any previous visit.  Elbow-L DG Complete: No results found for this or any previous visit.   Wrist Imaging: Wrist-R DG Complete: No results found for this or any previous visit.  Wrist-L DG Complete: No results found for this or any previous visit.   Hand Imaging: Hand-R DG Complete: No results found for this or any previous visit.  Hand-L DG Complete: No results found for this or any previous visit.   Complexity Note: Imaging results reviewed.                         ROS  Cardiovascular: {Hx; Cardiovascular History:210120525} Pulmonary or Respiratory: {Hx; Pumonary and/or Respiratory History:210120523} Neurological: {Hx; Neurological:210120504} Psychological-Psychiatric: {Hx; Psychological-Psychiatric History:210120512} Gastrointestinal: {Hx; Gastrointestinal:210120527} Genitourinary: {Hx; Genitourinary:210120506} Hematological: {Hx; Hematological:210120510} Endocrine: {Hx; Endocrine history:210120509} Rheumatologic: {Hx; Rheumatological:210120530} Musculoskeletal: {Hx; Musculoskeletal:210120528} Work History: {Hx; Work history:210120514}  Allergies  Mr. Mcentee is allergic to other and pollen extract.  Laboratory Chemistry Profile   Renal Lab Results  Component Value Date   BUN 25 (H) 05/29/2023   CREATININE 0.96 05/29/2023   BCR  26 (H) 05/29/2023   GFRAA >60 01/18/2017   GFRNONAA >60 12/21/2022   SPECGRAV 1.020 10/09/2016   PHUR 5.5 10/09/2016   PROTEINUR NEGATIVE 01/14/2023     Electrolytes Lab Results  Component Value Date   NA 139 05/29/2023   K 4.1 05/29/2023   CL 99 05/29/2023   CALCIUM 8.9 05/29/2023   MG 2.0 12/21/2022     Hepatic Lab Results  Component Value Date   AST 86 (H) 07/03/2023   ALT 82 (H) 07/03/2023   ALBUMIN 4.5 07/03/2023   ALKPHOS 66 07/03/2023     ID No results found for: "LYMEIGGIGMAB", "HIV", "SARSCOV2NAA", "STAPHAUREUS", "MRSAPCR", "HCVAB", "PREGTESTUR", "RMSFIGG", "QFVRPH1IGG",  "QFVRPH2IGG"   Bone No results found for: "VD25OH", "VD125OH2TOT", "AO1308MV7", "QI6962XB2", "25OHVITD1", "25OHVITD2", "25OHVITD3", "TESTOFREE", "TESTOSTERONE"   Endocrine Lab Results  Component Value Date   GLUCOSE 120 (H) 05/29/2023   GLUCOSEU NEGATIVE 01/14/2023   HGBA1C 7.4 (H) 07/03/2023   TSH 1.493 12/21/2022     Neuropathy Lab Results  Component Value Date   HGBA1C 7.4 (H) 07/03/2023     CNS No results found for: "COLORCSF", "APPEARCSF", "RBCCOUNTCSF", "WBCCSF", "POLYSCSF", "LYMPHSCSF", "EOSCSF", "PROTEINCSF", "GLUCCSF", "JCVIRUS", "CSFOLI", "IGGCSF", "LABACHR", "ACETBL"   Inflammation (CRP: Acute  ESR: Chronic) Lab Results  Component Value Date   LATICACIDVEN 1.5 09/22/2016     Rheumatology No results found for: "RF", "ANA", "LABURIC", "URICUR", "LYMEIGGIGMAB", "LYMEABIGMQN", "HLAB27"   Coagulation Lab Results  Component Value Date   PLT 266 12/21/2022     Cardiovascular Lab Results  Component Value Date   CKTOTAL 2,530 (HH) 07/03/2023   CKMB 5.8 (H) 01/26/2014   TROPONINI <0.03 01/18/2017   HGB 14.1 12/21/2022   HCT 42.8 12/21/2022     Screening No results found for: "SARSCOV2NAA", "COVIDSOURCE", "STAPHAUREUS", "MRSAPCR", "HCVAB", "HIV", "PREGTESTUR"   Cancer No results found for: "CEA", "CA125", "LABCA2"   Allergens No results found for: "ALMOND", "APPLE", "ASPARAGUS", "AVOCADO", "BANANA", "BARLEY", "BASIL", "BAYLEAF", "GREENBEAN", "LIMABEAN", "WHITEBEAN", "BEEFIGE", "REDBEET", "BLUEBERRY", "BROCCOLI", "CABBAGE", "MELON", "CARROT", "CASEIN", "CASHEWNUT", "CAULIFLOWER", "CELERY"     Note: Lab results reviewed.  PFSH  Drug: Mr. Garske  reports no history of drug use. Alcohol:  reports no history of alcohol use. Tobacco:  reports that he has never smoked. He quit smokeless tobacco use about 41 years ago.  His smokeless tobacco use included chew. Medical:  has a past medical history of Bipolar 1 disorder (HCC), Depression, Diabetes mellitus without  complication (HCC), Keratoconus of right eye, Sepsis (HCC) (09/22/2016), and Septicemia (HCC) (09/22/2016). Family: family history includes Breast cancer in his mother; Diabetes in his mother; Heart attack in his father.  Past Surgical History:  Procedure Laterality Date   EYE SURGERY     Active Ambulatory Problems    Diagnosis Date Noted   Hyperlipidemia 01/12/2008   Depression 09/22/2016   Bipolar I disorder (HCC) 09/22/2016   Obesity 12/23/2022   Keratoconus of both eyes 01/20/2013   Essential hypertension 04/27/2015   Esophageal reflux 04/27/2015   Tension headache 02/10/2023   Type 2 diabetes mellitus without complication, without long-term current use of insulin (HCC) 02/10/2023   Resolved Ambulatory Problems    Diagnosis Date Noted   Sepsis (HCC) 09/22/2016   UTI (urinary tract infection) 09/22/2016   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 In no apparent acute distress There were no vitals filed for this visit. BMI Assessment: Estimated body mass index is 30.89 kg/m as calculated from the following:   Height as of 09/05/23: 5' 11.5" (1.816 m).   Weight as of 09/05/23: 224 lb 9.6 oz (101.9 kg).  BMI interpretation table: BMI level Category Range association with higher incidence of chronic pain  <18 kg/m2 Underweight   18.5-24.9 kg/m2 Ideal body weight   25-29.9 kg/m2 Overweight Increased incidence by 20%  30-34.9 kg/m2 Obese (Class I) Increased incidence by 68%  35-39.9 kg/m2 Severe obesity (Class II) Increased incidence by 136%  >40 kg/m2 Extreme obesity (Class III) Increased incidence by 254%   Patient's current BMI Ideal Body weight  There is no height or weight on file to calculate BMI.  Patient weight not recorded   BMI Readings from Last 4 Encounters:  09/05/23 30.89 kg/m  07/04/23 29.38 kg/m  05/30/23 30.29 kg/m  02/10/23 24.07 kg/m   Wt Readings from Last 4 Encounters:  09/05/23 224 lb 9.6 oz (101.9 kg)  07/04/23 213 lb 9.6 oz (96.9 kg)  05/30/23 217 lb 3.2 oz (98.5 kg)  02/10/23 175 lb (79.4 kg)    Psych/Mental status: Alert, oriented x 3 (person, place, & time)       Eyes: PERLA Respiratory: No evidence of acute respiratory distress  Assessment  Primary Diagnosis & Pertinent Problem List: There were no encounter diagnoses.  Visit Diagnosis (New problems to examiner): No diagnosis found. Plan of Care (Initial workup plan)  Note: Mr. Zapf was reminded that as per protocol, today's visit has been an evaluation only. We have not taken over the patient's controlled substance management.  Problem-specific plan: Assessment and Plan            Lab Orders  No laboratory test(s) ordered today   Imaging Orders  No imaging studies ordered today   Referral Orders  No referral(s) requested today   Procedure Orders    No procedure(s) ordered today   Pharmacotherapy (current): Medications ordered:  No orders of the defined types were placed in this encounter.  Medications administered during this visit: Lugene A. Utsey had no medications administered during this visit.   Analgesic Pharmacotherapy:  Opioid Analgesics: For patients currently taking or requesting to take opioid analgesics, in accordance with [facility] Guidelines, we will assess their risks and indications for the use of these substances. After completing our evaluation, we may offer recommendations, but we no longer take patients for medication management. The prescribing physician will ultimately decide, based on his/her training and level of comfort whether to adopt any of the recommendations, including whether or not to prescribe such medicines.  Membrane  stabilizer: To be determined at a later time  Muscle relaxant: To be determined at a later time  NSAID: To be determined at a later time  Other analgesic(s): To be determined at a later time   Interventional management options: Mr. Maragh was informed that there is no guarantee that he would be a candidate for interventional therapies. The decision will be based on the results of diagnostic studies, as well as Mr. Lalla risk profile.  Procedure(s) under consideration:  Pending results of ordered studies      Interventional Therapies  Risk Factors  Considerations  Medical Comorbidities:     Planned  Pending:      Under consideration:   Pending   Completed:   None at this  time   Therapeutic  Palliative (PRN) options:   None established   Completed by other providers:   None reported       Provider-requested follow-up: No follow-ups on file.  Future Appointments  Date Time Provider Department Center  10/08/2023 11:00 AM Delano Metz, MD [facility] None  10/10/2023 11:00 AM Sherron Monday, MD [facility] None    Duration of encounter: *** minutes.  Total time on encounter, as per AMA guidelines included both the face-to-face and non-face-to-face time personally spent by the physician and/or other qualified health care professional(s) on the day of the encounter (includes time in activities that require the physician or other qualified health care professional and does not include time in activities normally performed by clinical staff). Physician's time may include the following activities when performed: Preparing to see the patient (e.g., pre-charting review of records, searching for previously ordered imaging, lab work, and nerve conduction tests) Review of prior analgesic pharmacotherapies. Reviewing PMP Interpreting ordered tests (e.g., lab work, imaging, nerve conduction tests) Performing post-procedure evaluations, including interpretation of  diagnostic procedures Obtaining and/or reviewing separately obtained history Performing a medically appropriate examination and/or evaluation Counseling and educating the patient/family/caregiver Ordering medications, tests, or procedures Referring and communicating with other health care professionals (when not separately reported) Documenting clinical information in the electronic or other health record Independently interpreting results (not separately reported) and communicating results to the patient/ family/caregiver Care coordination (not separately reported)  Note by: Oswaldo Done, MD (AI and TTS technology used. I apologize for any typographical errors that were not detected and corrected.) Date: 10/08/2023; Time: 8:08 AM

## 2023-10-08 ENCOUNTER — Ambulatory Visit: Payer: BC Managed Care – PPO | Attending: Pain Medicine | Admitting: Pain Medicine

## 2023-10-08 ENCOUNTER — Encounter: Payer: Self-pay | Admitting: Pain Medicine

## 2023-10-08 VITALS — BP 129/82 | HR 74 | Temp 98.4°F | Resp 16 | Ht 71.0 in | Wt 210.0 lb

## 2023-10-08 DIAGNOSIS — M899 Disorder of bone, unspecified: Secondary | ICD-10-CM | POA: Diagnosis present

## 2023-10-08 DIAGNOSIS — R937 Abnormal findings on diagnostic imaging of other parts of musculoskeletal system: Secondary | ICD-10-CM | POA: Diagnosis present

## 2023-10-08 DIAGNOSIS — M25511 Pain in right shoulder: Secondary | ICD-10-CM | POA: Diagnosis present

## 2023-10-08 DIAGNOSIS — G894 Chronic pain syndrome: Secondary | ICD-10-CM | POA: Insufficient documentation

## 2023-10-08 DIAGNOSIS — G4486 Cervicogenic headache: Secondary | ICD-10-CM | POA: Diagnosis present

## 2023-10-08 DIAGNOSIS — Z789 Other specified health status: Secondary | ICD-10-CM | POA: Diagnosis present

## 2023-10-08 DIAGNOSIS — M25512 Pain in left shoulder: Secondary | ICD-10-CM | POA: Insufficient documentation

## 2023-10-08 DIAGNOSIS — M5030 Other cervical disc degeneration, unspecified cervical region: Secondary | ICD-10-CM | POA: Insufficient documentation

## 2023-10-08 DIAGNOSIS — M4802 Spinal stenosis, cervical region: Secondary | ICD-10-CM | POA: Insufficient documentation

## 2023-10-08 DIAGNOSIS — M47812 Spondylosis without myelopathy or radiculopathy, cervical region: Secondary | ICD-10-CM | POA: Insufficient documentation

## 2023-10-08 DIAGNOSIS — M5382 Other specified dorsopathies, cervical region: Secondary | ICD-10-CM | POA: Insufficient documentation

## 2023-10-08 DIAGNOSIS — G8929 Other chronic pain: Secondary | ICD-10-CM | POA: Diagnosis present

## 2023-10-08 DIAGNOSIS — Z79899 Other long term (current) drug therapy: Secondary | ICD-10-CM | POA: Insufficient documentation

## 2023-10-08 DIAGNOSIS — M542 Cervicalgia: Secondary | ICD-10-CM | POA: Diagnosis present

## 2023-10-08 NOTE — Patient Instructions (Addendum)
____________________________________________________________________________________________  New Patients  Welcome to [facility].   Initial Visit The first or initial visit consists of an evaluation only.   Interventional pain management.  We offer therapies other than opioid controlled substances to manage chronic pain. These include, but are not limited to, diagnostic, therapeutic, and palliative specialized injection therapies (i.e.: Epidural Steroids, Facet Blocks, etc.). We specialize in a variety of nerve blocks as well as radiofrequency treatments. We offer pain implant evaluations and trials, as well as follow up management. In addition we also provide a variety joint injections, including Viscosupplementation (AKA: Gel Therapy).  Prescription Pain Medication. We specialize in alternatives to opioids. We can provide evaluations and recommendations for/of pharmacologic therapies based on CDC Guidelines.  We no longer take patients for long-term medication management. We will not be taking over your pain medications.  ____________________________________________________________________________________________    ____________________________________________________________________________________________  Patient Information update  To: All of our patients.  Re: Name change.  It has been made official that our current name, "[facility]"   will soon be changed to "[facility]".   The purpose of this change is to eliminate any confusion created by the concept of our practice being a "Medication Management Pain Clinic". In the past this has led to the misconception that we treat pain primarily by the use of prescription medications.  Nothing can be farther from the truth.   Understanding PAIN MANAGEMENT: To  further understand what our practice does, you first have to understand that "Pain Management" is a subspecialty that requires additional training once a physician has completed their specialty training, which can be in either Anesthesia, Neurology, Psychiatry, or Physical Medicine and Rehabilitation (PMR). Each one of these contributes to the final approach taken by each physician to the management of their patient's pain. To be a "Pain Management Specialist" you must have first completed one of the specialty trainings below.  Anesthesiologists - trained in clinical pharmacology and interventional techniques such as nerve blockade and regional as well as central neuroanatomy. They are trained to block pain before, during, and after surgical interventions.  Neurologists - trained in the diagnosis and pharmacological treatment of complex neurological conditions, such as Multiple Sclerosis, Parkinson's, spinal cord injuries, and other systemic conditions that may be associated with symptoms that may include but are not limited to pain. They tend to rely primarily on the treatment of chronic pain using prescription medications.  Psychiatrist - trained in conditions affecting the psychosocial wellbeing of patients including but not limited to depression, anxiety, schizophrenia, personality disorders, addiction, and other substance use disorders that may be associated with chronic pain. They tend to rely primarily on the treatment of chronic pain using prescription medications.   Physical Medicine and Rehabilitation (PMR) physicians, also known as physiatrists - trained to treat a wide variety of medical conditions affecting the brain, spinal cord, nerves, bones, joints, ligaments, muscles, and tendons. Their training is primarily aimed at treating patients that have suffered injuries that have caused severe physical impairment. Their training is primarily aimed at the physical therapy and rehabilitation of those  patients. They may also work alongside orthopedic surgeons or neurosurgeons using their expertise in assisting surgical patients to recover after their surgeries.  INTERVENTIONAL PAIN MANAGEMENT is sub-subspecialty of Pain Management.  Our physicians are Board-certified in Anesthesia, Pain Management, and Interventional Pain Management.  This meaning that not only have they been trained  and Board-certified in their specialty of Anesthesia, and subspecialty of Pain Management, but they have also received further training in the sub-subspecialty of Interventional Pain Management, in order to become Board-certified as INTERVENTIONAL PAIN MANAGEMENT SPECIALIST.    Mission: Our goal is to use our skills in  INTERVENTIONAL PAIN MANAGEMENT as alternatives to the chronic use of prescription opioid medications for the treatment of pain. To make this more clear, we have changed our name to reflect what we do and offer. We will continue to offer medication management assessment and recommendations, but we will not be taking over any patient's medication management.  ____________________________________________________________________________________________     ____________________________________________________________________________________________  General Risks and Possible Complications  Patient Responsibilities: It is important that you read this as it is part of your informed consent. It is our duty to inform you of the risks and possible complications associated with treatments offered to you. It is your responsibility as a patient to read this and to ask questions about anything that is not clear or that you believe was not covered in this document.  Patient's Rights: You have the right to refuse treatment. You also have the right to change your mind, even after initially having agreed to have the treatment done. However, under this last option, if you wait until the last second to change your mind,  you may be charged for the materials used up to that point.  Introduction: Medicine is not an Visual merchandiser. Everything in Medicine, including the lack of treatment(s), carries the potential for danger, harm, or loss (which is by definition: Risk). In Medicine, a complication is a secondary problem, condition, or disease that can aggravate an already existing one. All treatments carry the risk of possible complications. The fact that a side effects or complications occurs, does not imply that the treatment was conducted incorrectly. It must be clearly understood that these can happen even when everything is done following the highest safety standards.  No treatment: You can choose not to proceed with the proposed treatment alternative. The "PRO(s)" would include: avoiding the risk of complications associated with the therapy. The "CON(s)" would include: not getting any of the treatment benefits. These benefits fall under one of three categories: diagnostic; therapeutic; and/or palliative. Diagnostic benefits include: getting information which can ultimately lead to improvement of the disease or symptom(s). Therapeutic benefits are those associated with the successful treatment of the disease. Finally, palliative benefits are those related to the decrease of the primary symptoms, without necessarily curing the condition (example: decreasing the pain from a flare-up of a chronic condition, such as incurable terminal cancer).  General Risks and Complications: These are associated to most interventional treatments. They can occur alone, or in combination. They fall under one of the following six (6) categories: no benefit or worsening of symptoms; bleeding; infection; nerve damage; allergic reactions; and/or death. No benefits or worsening of symptoms: In Medicine there are no guarantees, only probabilities. No healthcare provider can ever guarantee that a medical treatment will work, they can only state the  probability that it may. Furthermore, there is always the possibility that the condition may worsen, either directly, or indirectly, as a consequence of the treatment. Bleeding: This is more common if the patient is taking a blood thinner, either prescription or over the counter (example: Goody Powders, Fish oil, Aspirin, Garlic, etc.), or if suffering a condition associated with impaired coagulation (example: Hemophilia, cirrhosis of the liver, low platelet counts, etc.). However, even if you do not have  one on these, it can still happen. If you have any of these conditions, or take one of these drugs, make sure to notify your treating physician. Infection: This is more common in patients with a compromised immune system, either due to disease (example: diabetes, cancer, human immunodeficiency virus [HIV], etc.), or due to medications or treatments (example: therapies used to treat cancer and rheumatological diseases). However, even if you do not have one on these, it can still happen. If you have any of these conditions, or take one of these drugs, make sure to notify your treating physician. Nerve Damage: This is more common when the treatment is an invasive one, but it can also happen with the use of medications, such as those used in the treatment of cancer. The damage can occur to small secondary nerves, or to large primary ones, such as those in the spinal cord and brain. This damage may be temporary or permanent and it may lead to impairments that can range from temporary numbness to permanent paralysis and/or brain death. Allergic Reactions: Any time a substance or material comes in contact with our body, there is the possibility of an allergic reaction. These can range from a mild skin rash (contact dermatitis) to a severe systemic reaction (anaphylactic reaction), which can result in death. Death: In general, any medical intervention can result in death, most of the time due to an unforeseen  complication. ____________________________________________________________________________________________

## 2023-10-08 NOTE — Progress Notes (Signed)
Safety precautions to be maintained throughout the outpatient stay will include: orient to surroundings, keep bed in low position, maintain call bell within reach at all times, provide assistance with transfer out of bed and ambulation.

## 2023-10-09 ENCOUNTER — Ambulatory Visit
Admission: RE | Admit: 2023-10-09 | Discharge: 2023-10-09 | Disposition: A | Discharge status: Home / Self Care | Payer: BC Managed Care – PPO | Attending: Pain Medicine | Admitting: Pain Medicine

## 2023-10-09 ENCOUNTER — Ambulatory Visit
Admission: RE | Admit: 2023-10-09 | Discharge: 2023-10-09 | Disposition: A | Discharge status: Home / Self Care | Payer: BC Managed Care – PPO | Source: Ambulatory Visit | Attending: Pain Medicine | Admitting: Pain Medicine

## 2023-10-09 DIAGNOSIS — M542 Cervicalgia: Secondary | ICD-10-CM | POA: Insufficient documentation

## 2023-10-09 DIAGNOSIS — R937 Abnormal findings on diagnostic imaging of other parts of musculoskeletal system: Secondary | ICD-10-CM | POA: Diagnosis present

## 2023-10-09 DIAGNOSIS — M25511 Pain in right shoulder: Secondary | ICD-10-CM | POA: Diagnosis present

## 2023-10-09 DIAGNOSIS — M25512 Pain in left shoulder: Secondary | ICD-10-CM | POA: Insufficient documentation

## 2023-10-09 DIAGNOSIS — G4486 Cervicogenic headache: Secondary | ICD-10-CM | POA: Diagnosis present

## 2023-10-09 DIAGNOSIS — G8929 Other chronic pain: Secondary | ICD-10-CM

## 2023-10-09 DIAGNOSIS — M5382 Other specified dorsopathies, cervical region: Secondary | ICD-10-CM | POA: Diagnosis present

## 2023-10-09 DIAGNOSIS — M5030 Other cervical disc degeneration, unspecified cervical region: Secondary | ICD-10-CM | POA: Diagnosis present

## 2023-10-09 DIAGNOSIS — M4802 Spinal stenosis, cervical region: Secondary | ICD-10-CM

## 2023-10-10 ENCOUNTER — Ambulatory Visit: Payer: BC Managed Care – PPO | Admitting: Internal Medicine

## 2023-10-10 VITALS — BP 131/78 | HR 72 | Temp 96.1°F | Ht 71.0 in | Wt 224.8 lb

## 2023-10-10 DIAGNOSIS — E782 Mixed hyperlipidemia: Secondary | ICD-10-CM

## 2023-10-10 DIAGNOSIS — E119 Type 2 diabetes mellitus without complications: Secondary | ICD-10-CM | POA: Diagnosis not present

## 2023-10-10 DIAGNOSIS — I10 Essential (primary) hypertension: Secondary | ICD-10-CM

## 2023-10-10 DIAGNOSIS — K148 Other diseases of tongue: Secondary | ICD-10-CM

## 2023-10-10 DIAGNOSIS — N401 Enlarged prostate with lower urinary tract symptoms: Secondary | ICD-10-CM

## 2023-10-10 DIAGNOSIS — N138 Other obstructive and reflux uropathy: Secondary | ICD-10-CM

## 2023-10-10 LAB — LIPID PANEL
Chol/HDL Ratio: 2.3 {ratio} (ref 0.0–5.0)
Cholesterol, Total: 124 mg/dL (ref 100–199)
HDL: 53 mg/dL (ref >39)
LDL Chol Calc (NIH): 58 mg/dL (ref 0–99)
Triglycerides: 60 mg/dL (ref 0–149)
VLDL Cholesterol Cal: 13 mg/dL (ref 5–40)

## 2023-10-10 LAB — HEMOGLOBIN A1C
Est. average glucose Bld gHb Est-mCnc: 151 mg/dL
Hgb A1c MFr Bld: 6.9 % — ABNORMAL HIGH (ref 4.8–5.6)

## 2023-10-10 MED ORDER — NYSTATIN 100000 UNIT/ML MT SUSP: 5.0000 mL | Freq: Four times a day (QID) | OROMUCOSAL | 0 refills | Status: AC

## 2023-10-10 NOTE — Progress Notes (Signed)
Established Patient Office Visit  Subjective:  Patient ID: Micheal Gay, male    DOB: 1966-06-23  Age: 58 y.o. MRN: [id number]  No chief complaint on file.   No new complaints, here for lab review and medication refills. Labs reviewed and notable for well controlled diabetes, A1c at target, lipids also at target. Denies any hypoglycemic episodes and home bg readings have been at target.   No other concerns at this time.   Past Medical History:  Diagnosis Date   Bipolar 1 disorder (HCC)    Depression    Diabetes mellitus without complication (HCC)    Keratoconus of right eye    Sepsis (HCC) 09/22/2016   Septicemia (HCC) 09/22/2016    Past Surgical History:  Procedure Laterality Date   EYE SURGERY      Social History   Socioeconomic History   Marital status: Divorced    Spouse name: Not on file   Number of children: Not on file   Years of education: Not on file   Highest education level: Not on file  Occupational History   Not on file  Tobacco Use   Smoking status: Never   Smokeless tobacco: Former    Types: Chew    Quit date: 09/09/1982  Substance and Sexual Activity   Alcohol use: No   Drug use: No   Sexual activity: Not on file  Other Topics Concern   Not on file  Social History Narrative   Not on file   Social Drivers of Health   Financial Resource Strain: Not on file  Food Insecurity: Not on file  Transportation Needs: Not on file  Physical Activity: Not on file  Stress: Not on file  Social Connections: Not on file  Intimate Partner Violence: Not on file    Family History  Problem Relation Age of Onset   Diabetes Mother    Breast cancer Mother    Heart attack Father    Prostate cancer Neg Hx    Kidney cancer Neg Hx    Bladder Cancer Neg Hx     Allergies  Allergen Reactions   Other     Animal Dandruff Hay     Pollen Extract     Outpatient Medications Prior to Visit  Medication Sig   amitriptyline (ELAVIL) 10 MG tablet TAKE 1  TABLET BY MOUTH AT BEDTIME   atorvastatin (LIPITOR) 20 MG tablet Take 1 tablet (20 mg total) by mouth at bedtime. TAKE 1 TABLET BY MOUTH ONCE DAILY IN THE EVENING . APPOINTMENT REQUIRED FOR FUTURE REFILLS   clonazePAM (KLONOPIN) 0.5 MG tablet Take 0.5 mg by mouth 3 (three) times daily as needed.   empagliflozin (JARDIANCE) 25 MG TABS tablet TAKE 1 TABLET BY MOUTH ONCE DAILY BEFORE BREAKFAST   gabapentin (NEURONTIN) 400 MG capsule Take 400 mg by mouth 2 (two) times daily.   indomethacin (INDOCIN) 50 MG capsule TAKE 1 CAPSULE BY MOUTH THREE TIMES DAILY AS NEEDED FOR HEADACHE   meloxicam (MOBIC) 15 MG tablet Take 15 mg by mouth daily.   metFORMIN (GLUCOPHAGE) 1000 MG tablet Take 1 tablet by mouth twice daily   ondansetron (ZOFRAN-ODT) 4 MG disintegrating tablet DISSOLVE 1 TABLET IN MOUTH EVERY 8 HOURS AS NEEDED FOR NAUSEA FOR VOMITING   pantoprazole (PROTONIX) 40 MG tablet Take 40 mg by mouth daily.   pioglitazone (ACTOS) 45 MG tablet Take 1 tablet (45 mg total) by mouth every morning.   Rimegepant Sulfate (NURTEC) 75 MG TBDP Take 1 tablet (75 mg  total) by mouth daily as needed (headache).   sertraline (ZOLOFT) 25 MG tablet Take 25 mg by mouth daily.   sildenafil (VIAGRA) 100 MG tablet TAKE ONE-HALF TO ONE TABLET BY MOUTH 30 MINUTES TO 4 HOURS BEFORE INTERCOURSE AS DIRECTED   Zavegepant HCl (ZAVZPRET) 10 MG/ACT SOLN Place 1 spray into the nose daily.   nortriptyline (PAMELOR) 10 MG capsule Take by mouth. (Patient not taking: Reported on 10/10/2023)   promethazine (PHENERGAN) 12.5 MG tablet Take 1 tablet (12.5 mg total) by mouth every 6 (six) hours as needed for up to 7 days for nausea or vomiting. (Patient not taking: Reported on 09/05/2023)   No facility-administered medications prior to visit.    Review of Systems  Constitutional: Negative.  Negative for weight loss.  HENT: Negative.    Eyes: Negative.   Respiratory: Negative.    Cardiovascular: Negative.   Gastrointestinal: Negative.    Genitourinary: Negative.   Skin: Negative.   Neurological: Negative.   Endo/Heme/Allergies: Negative.        Objective:   BP 131/78   Pulse 72   Temp (!) 96.1 F (35.6 C)   Ht 5\' 11"  (1.803 m)   Wt 224 lb 12.8 oz (102 kg)   SpO2 98%   BMI 31.35 kg/m   Vitals:   10/10/23 1114  BP: 131/78  Pulse: 72  Temp: (!) 96.1 F (35.6 C)  Height: 5\' 11"  (1.803 m)  Weight: 224 lb 12.8 oz (102 kg)  SpO2: 98%  BMI (Calculated): 31.37    Physical Exam Vitals reviewed.  Constitutional:      Appearance: Normal appearance.  HENT:     Head: Normocephalic.     Left Ear: There is no impacted cerumen.     Nose: Nose normal.     Mouth/Throat:     Mouth: Mucous membranes are moist.     Tongue: Lesions (papillae/bullae on the left front tongue and the back) present.     Pharynx: No posterior oropharyngeal erythema.  Eyes:     Extraocular Movements: Extraocular movements intact.     Pupils: Pupils are equal, round, and reactive to light.  Cardiovascular:     Rate and Rhythm: Regular rhythm.     Chest Wall: PMI is not displaced.     Pulses: Normal pulses.     Heart sounds: Normal heart sounds. No murmur heard. Pulmonary:     Effort: Pulmonary effort is normal.     Breath sounds: Normal air entry. No rhonchi or rales.  Abdominal:     General: Abdomen is flat. Bowel sounds are normal. There is no distension.     Palpations: Abdomen is soft. There is no hepatomegaly, splenomegaly or mass.     Tenderness: There is no abdominal tenderness.  Musculoskeletal:        General: Normal range of motion.     Cervical back: Normal range of motion and neck supple.     Right lower leg: No edema.     Left lower leg: No edema.  Skin:    General: Skin is warm and dry.  Neurological:     General: No focal deficit present.     Mental Status: He is alert and oriented to person, place, and time.     Cranial Nerves: No cranial nerve deficit.     Motor: No weakness.  Psychiatric:        Mood and  Affect: Mood normal.        Behavior: Behavior normal.  No results found for any visits on 10/10/23.  Lab Results  Component Value Date   HGBA1C 6.9 (H) 10/09/2023   HGBA1C 7.4 (H) 07/03/2023   HGBA1C 5.9 (H) 09/24/2016   Lipid Panel     Component Value Date/Time   CHOL 124 10/09/2023 0911   CHOL 168 01/27/2014 0013   TRIG 60 10/09/2023 0911   TRIG 58 01/27/2014 0013   HDL 53 10/09/2023 0911   HDL 45 01/27/2014 0013   CHOLHDL 2.3 10/09/2023 0911   CHOLHDL 5.9 CALC 01/12/2008 0859   VLDL 12 01/27/2014 0013   LDLCALC 58 10/09/2023 0911   LDLCALC 111 (H) 01/27/2014 0013   LDLDIRECT 184.1 01/12/2008 0859   LABVLDL 13 10/09/2023 0911       Assessment & Plan:  As per problem list  Problem List Items Addressed This Visit       Cardiovascular and Mediastinum   Essential hypertension     Other   Hyperlipidemia   Relevant Orders   Lipid panel   Other Visit Diagnoses       Lesion of tongue    -  Primary   Relevant Medications   nystatin (MYCOSTATIN) 100000 UNIT/ML suspension     Controlled type 2 diabetes mellitus without complication, without long-term current use of insulin (HCC)       Relevant Orders   Hemoglobin A1c       Return in about 3 months (around 01/07/2024) for fu with labs prior.   Total time spent: 20 minutes  Luna Fuse, MD  10/10/2023   This document may have been prepared by [facility] Voice Recognition software and as such may include unintentional dictation errors.

## 2023-10-13 LAB — COMPLIANCE DRUG ANALYSIS, UR

## 2023-10-14 LAB — COMP. METABOLIC PANEL (12)
AST: 74 [IU]/L — ABNORMAL HIGH (ref 0–40)
Albumin: 4.8 g/dL (ref 3.8–4.9)
Alkaline Phosphatase: 76 [IU]/L (ref 44–121)
BUN/Creatinine Ratio: 29 — ABNORMAL HIGH (ref 9–20)
BUN: 30 mg/dL — ABNORMAL HIGH (ref 6–24)
Bilirubin Total: 0.3 mg/dL (ref 0.0–1.2)
Calcium: 9.3 mg/dL (ref 8.7–10.2)
Chloride: 99 mmol/L (ref 96–106)
Creatinine, Ser: 1.02 mg/dL (ref 0.76–1.27)
Globulin, Total: 2.6 g/dL (ref 1.5–4.5)
Glucose: 110 mg/dL — ABNORMAL HIGH (ref 70–99)
Potassium: 4.6 mmol/L (ref 3.5–5.2)
Sodium: 141 mmol/L (ref 134–144)
Total Protein: 7.4 g/dL (ref 6.0–8.5)
eGFR: 86 mL/min/{1.73_m2} (ref >59)

## 2023-10-14 LAB — 25-HYDROXY VITAMIN D LCMS D2+D3
25-Hydroxy, Vitamin D-2: <1 ng/mL
25-Hydroxy, Vitamin D-3: 26 ng/mL
25-Hydroxy, Vitamin D: 27 ng/mL — ABNORMAL LOW

## 2023-10-14 LAB — VITAMIN B12: Vitamin B-12: 396 pg/mL (ref 232–1245)

## 2023-10-14 LAB — MAGNESIUM: Magnesium: 2 mg/dL (ref 1.6–2.3)

## 2023-10-14 LAB — C-REACTIVE PROTEIN: CRP: <1 mg/L (ref 0–10)

## 2023-10-14 LAB — SEDIMENTATION RATE: Sed Rate: 23 mm/h (ref 0–30)

## 2023-10-29 ENCOUNTER — Encounter: Payer: Self-pay | Admitting: Pain Medicine

## 2023-10-29 ENCOUNTER — Ambulatory Visit: Payer: BC Managed Care – PPO | Attending: Pain Medicine | Admitting: Pain Medicine

## 2023-10-29 VITALS — BP 122/72 | HR 71 | Temp 97.2°F | Resp 16 | Ht 71.0 in | Wt 224.0 lb

## 2023-10-29 DIAGNOSIS — M4802 Spinal stenosis, cervical region: Secondary | ICD-10-CM | POA: Insufficient documentation

## 2023-10-29 DIAGNOSIS — M5382 Other specified dorsopathies, cervical region: Secondary | ICD-10-CM | POA: Insufficient documentation

## 2023-10-29 DIAGNOSIS — M542 Cervicalgia: Secondary | ICD-10-CM | POA: Insufficient documentation

## 2023-10-29 DIAGNOSIS — M5412 Radiculopathy, cervical region: Secondary | ICD-10-CM | POA: Insufficient documentation

## 2023-10-29 DIAGNOSIS — M5030 Other cervical disc degeneration, unspecified cervical region: Secondary | ICD-10-CM | POA: Diagnosis present

## 2023-10-29 DIAGNOSIS — M25512 Pain in left shoulder: Secondary | ICD-10-CM | POA: Diagnosis present

## 2023-10-29 DIAGNOSIS — M4310 Spondylolisthesis, site unspecified: Secondary | ICD-10-CM | POA: Insufficient documentation

## 2023-10-29 DIAGNOSIS — R937 Abnormal findings on diagnostic imaging of other parts of musculoskeletal system: Secondary | ICD-10-CM | POA: Insufficient documentation

## 2023-10-29 DIAGNOSIS — G8929 Other chronic pain: Secondary | ICD-10-CM | POA: Diagnosis present

## 2023-10-29 DIAGNOSIS — R799 Abnormal finding of blood chemistry, unspecified: Secondary | ICD-10-CM | POA: Insufficient documentation

## 2023-10-29 DIAGNOSIS — M47812 Spondylosis without myelopathy or radiculopathy, cervical region: Secondary | ICD-10-CM | POA: Insufficient documentation

## 2023-10-29 DIAGNOSIS — M25511 Pain in right shoulder: Secondary | ICD-10-CM | POA: Diagnosis present

## 2023-10-29 DIAGNOSIS — G4486 Cervicogenic headache: Secondary | ICD-10-CM | POA: Diagnosis present

## 2023-10-29 DIAGNOSIS — M4722 Other spondylosis with radiculopathy, cervical region: Secondary | ICD-10-CM

## 2023-10-29 DIAGNOSIS — E559 Vitamin D deficiency, unspecified: Secondary | ICD-10-CM | POA: Insufficient documentation

## 2023-10-29 DIAGNOSIS — R7401 Elevation of levels of liver transaminase levels: Secondary | ICD-10-CM | POA: Diagnosis present

## 2023-10-29 MED ORDER — ERGOCALCIFEROL 1.25 MG (50000 UT) PO CAPS: 50000.0000 [IU] | ORAL_CAPSULE | ORAL | 0 refills | Status: AC

## 2023-10-29 MED ORDER — VITAMIN D3 125 MCG (5000 UT) PO CAPS: 5000.0000 [IU] | ORAL_CAPSULE | Freq: Every day | ORAL | 0 refills | Status: AC

## 2023-10-29 NOTE — Patient Instructions (Addendum)

## 2023-10-29 NOTE — Progress Notes (Signed)
 Safety precautions to be maintained throughout the outpatient stay will include: orient to surroundings, keep bed in low position, maintain call bell within reach at all times, provide assistance with transfer out of bed and ambulation.

## 2023-10-29 NOTE — Progress Notes (Addendum)
 PROVIDER NOTE: Information contained herein reflects review and annotations entered in association with encounter. Interpretation of such information and data should be left to medically-trained personnel. Information provided to patient can be located elsewhere in the medical record under "Patient Instructions". Document created using STT-dictation technology, any transcriptional errors that may result from process are unintentional.    Patient: Micheal Gay  Service Category: E/M  Provider: Oswaldo Done, MD  DOB: 02/28/1966  DOS: 10/29/2023  Referring Provider: Sherron Monday, MD  MRN: [id number]  Specialty: Interventional Pain Management  PCP: Sherron Monday, MD  Type: Established Patient  Setting: Ambulatory outpatient    Location: Office  Delivery: Face-to-face     Primary Reason(s) for Visit: Encounter for evaluation before starting new chronic pain management plan of care (Level of risk: moderate) CC: Neck Pain  HPI  [name] is a 58 y.o. year old, male patient, who comes today for a follow-up evaluation to review the test results and decide on a treatment plan. He has Hyperlipidemia; Depression; Bipolar I disorder (HCC); Obesity; Keratoconus of both eyes; Essential hypertension; Esophageal reflux; Tension headache; Type 2 diabetes mellitus without complication, without long-term current use of insulin (HCC); Benign prostatic hyperplasia with urinary obstruction; Colon cancer screening; Increased frequency of urination; Urinary hesitancy; Weight loss, non-intentional; Esophageal dysphagia; Chronic pain syndrome; Pharmacologic therapy; Disorder of skeletal system; Problems influencing health status; Long term prescription benzodiazepine use; Abnormal MRI, cervical spine (07/23/2023); DDD (degenerative disc disease), cervical; 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, C5-6, C6-7); 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 His primarily concern today is the Neck Pain  Pain Assessment: Location: Anterior Neck Radiating: radiates up into head Onset: More than a month ago Duration: Chronic pain Quality:  (feels like a stiff neck and headaches.  I drool and I cant control tongue) Severity: 7 /10 (subjective, self-reported pain score)  Effect on ADL: Limits movement Timing: Constant Modifying factors:   BP: 122/72  HR: 71  [name] comes in today for a follow-up visit after his initial evaluation on 10/08/2023. Today we went over the results of his tests. These were explained in "Layman's terms". During today's appointment we went over my diagnostic impression, as well as the proposed treatment plan.  Review of initial evaluation (10/08/2023): "The patient presents with neck pain, headaches, and shoulder pain. He is accompanied by his wife, Almira Coaster.   The patient experiences neck pain exacerbated by movement, particularly when turning the head to the left, which causes pain in the right shoulder. The pain is also present when looking down and up, with significant discomfort noted in the middle of the back near the spine. No numbness or weakness in the arms is reported.   Shoulder pain is bilateral and does not radiate down the arms. The pain is associated with neck movements, particularly turning the head.   Headaches start at the back of the head and move towards the front, often settling over  the eyes like a 'headband'. He associates the onset of these headaches with recent dental  work, specifically after having teeth pulled at the bottom, which he believes may have affected a nerve. He is planning to get dental implants on the bottom jaw next month.   An MRI of the neck revealed issues at the C4-5 level, including swelling inside the vertebral body and biforaminal impingement, as well as foraminal spurs and narrowing at the C5-6 level."  Review of diagnostic test ordered on 10/08/2023: Lab work: C-reactive protein, magnesium, sed rate, and vitamin B12 levels were all within normal limits.  The comprehensive metabolic panel done without fasting showed mildly increase glucose levels with an elevated BUN level of 30 mg/dL (normal 1-61), elevated BUN/creatinine ratio of 29 (normal 9-20) and an elevated AST level of 74 IU/L (normal 0-40).  In addition vitamin D levels came back low at 27 ng/mL (normal 30-100) demonstrating a vitamin D insufficiency. Diagnostic imaging: Diagnostic x-rays of the cervical spine with flexion and extension views demonstrated degenerative disc disease from C3-4 through C5-6 with moderate multilevel facet hypertrophy.  In addition he demonstrated multilevel bony neuroforaminal stenosis, as well as trace retrolisthesis of C3 over C4 which appeared unchanged on flexion and extension.   Discussed the use of AI scribe software for clinical note transcription with the patient, who gave verbal consent to proceed.  History of Present Illness   [name] is a 58 year old male who presents with neck pain, headaches, and shoulder pain.  He experiences neck pain as the most severe symptom, affecting both sides and the posterior aspect. This is accompanied by headaches and shoulder pain. X-rays of the cervical spine show disease from C3-4 to C5-6 levels, multilevel facet hypertrophy, and retroesthesia between C3 and C4, where the C3 vertebral body has slipped backward over  C4. This condition stresses the facet joints, contributing to his symptoms. He describes a 'creaking' and 'popping' sensation when massaging the neck and notes a bulge below the neck, identified as the C7 spinous process.  He has difficulty controlling his bladder, experiencing nocturnal enuresis and daytime accidents for the past week, including during naps at work. He uses diapers to manage this issue and suspects Jardiance may be affecting his bladder.  His vitamin D level is 27, indicating insufficiency. Despite drinking a lot of milk, he acknowledges that his current regimen is insufficient to maintain adequate vitamin D levels.  Lab work shows mildly elevated BUN at 30 and BUN/creatinine ratio at 29, suggesting possible kidney function issues. Other lab results, including C-reactive protein, magnesium levels, sedimentation rate, and vitamin B12 levels, are normal.      Patient presented with interventional treatment options. Mr. Thueson was informed that I will not be providing medication management. Pharmacotherapy evaluation including recommendations may be offered, if specifically requested.   Controlled Substance Pharmacotherapy Assessment REMS (Risk Evaluation and Mitigation Strategy)  Opioid Analgesic: None MME/day: 0 mg/day  Pill Count: None expected due to no prior prescriptions written by our practice. Valerie Salts, RN  10/29/2023  8:17 AM  Signed Safety precautions to be maintained throughout the outpatient stay will include: orient to surroundings, keep bed in low position, maintain call bell within reach at all times, provide assistance with transfer out of bed and ambulation.   Pharmacokinetics: Liberation and absorption (onset of action): WNL Distribution (time to peak effect): WNL Metabolism and excretion (duration of action): WNL         Pharmacodynamics: Desired effects: Analgesia: Mr. Francom reports >50% benefit. Functional ability: Patient reports that medication  allows him to accomplish  basic ADLs Clinically meaningful improvement in function (CMIF): Sustained CMIF goals met Perceived effectiveness: Described as relatively effective, allowing for increase in activities of daily living (ADL) Undesirable effects: Side-effects or Adverse reactions: None reported Monitoring: [place] PMP: PDMP reviewed during this encounter. Online review of the past 84-month period previously conducted. Not applicable at this point since we have not taken over the patient's medication management yet. List of other Serum/Urine Drug Screening Test(s):  Lab Results  Component Value Date   COCAINSCRNUR NONE DETECTED 12/22/2022   THCU NONE DETECTED 12/22/2022   List of all UDS test(s) done:  Lab Results  Component Value Date   SUMMARY FINAL 10/08/2023   Last UDS on record: Summary  Date Value Ref Range Status  10/08/2023 FINAL  Final    Comment:    ==================================================================== Compliance Drug Analysis, Ur ==================================================================== Test                             Result       Flag       Units  Drug Present and Declared for Prescription Verification   7-aminoclonazepam              161          EXPECTED   ng/mg creat    7-aminoclonazepam is an expected metabolite of clonazepam. Source of    clonazepam is a scheduled prescription medication.    Gabapentin                     PRESENT      EXPECTED  Drug Absent but Declared for Prescription Verification   Amitriptyline                  Not Detected UNEXPECTED   Nortriptyline                  Not Detected UNEXPECTED   Promethazine                   Not Detected UNEXPECTED ==================================================================== Test                      Result    Flag   Units      Ref Range   Creatinine              49               mg/dL       >=96 ==================================================================== Declared Medications:  The flagging and interpretation on this report are based on the  following declared medications.  Unexpected results may arise from  inaccuracies in the declared medications.   **Note: The testing scope of this panel includes these medications:   Amitriptyline (Elavil)  Clonazepam (Klonopin)  Gabapentin  Nortriptyline (Pamelor)  Promethazine (Phenergan)   **Note: The testing scope of this panel does not include the  following reported medications:   Atorvastatin (Lipitor)  Empagliflozin (Jardiance)  Indomethacin (Indocin)  Meloxicam (Mobic)  Metformin  Ondansetron (Zofran)  Pantoprazole (Protonix)  Pioglitazone (Actos)  Rimegepant (Nurtec)  Sildenafil (Viagra)  Zavegepant (Zavzpret) ==================================================================== For clinical consultation, please call [phone]. ====================================================================    UDS interpretation: No unexpected findings.          Medication Assessment Form: Not applicable. No opioids. Treatment compliance: Not applicable Risk Assessment Profile: Aberrant behavior: See initial evaluations. None observed or detected today Comorbid factors increasing risk of overdose: See  initial evaluation. No additional risks detected today Opioid risk tool (ORT):     10/08/2023   10:59 AM  Opioid Risk   Alcohol 0  Illegal Drugs 0  Rx Drugs 0  Alcohol 0  Illegal Drugs 0  Rx Drugs 0  Age between 16-45 years  0  History of Preadolescent Sexual Abuse 0  Psychological Disease 2  Bipolar Positive  Depression 1  Opioid Risk Tool Scoring 3  Opioid Risk Interpretation Low Risk    ORT Scoring interpretation table:  Score <3 = Low Risk for SUD  Score between 4-7 = Moderate Risk for SUD  Score >8 = High Risk for Opioid Abuse   Risk of substance use disorder (SUD): Low  Risk Mitigation  Strategies:  Patient opioid safety counseling: No controlled substances prescribed. Patient-Prescriber Agreement (PPA): No agreement signed.  Controlled substance notification to other providers: None required. No opioid therapy.  Pharmacologic Plan: Non-opioid analgesic therapy offered. Interventional alternatives discussed.             Laboratory Chemistry Profile   Renal Lab Results  Component Value Date   BUN 30 (H) 10/08/2023   CREATININE 1.02 10/08/2023   BCR 29 (H) 10/08/2023   GFRAA >60 01/18/2017   GFRNONAA >60 12/21/2022   SPECGRAV 1.020 10/09/2016   PHUR 5.5 10/09/2016   PROTEINUR NEGATIVE 01/14/2023     Electrolytes Lab Results  Component Value Date   NA 141 10/08/2023   K 4.6 10/08/2023   CL 99 10/08/2023   CALCIUM 9.3 10/08/2023   MG 2.0 10/08/2023     Hepatic Lab Results  Component Value Date   AST 74 (H) 10/08/2023   ALT 82 (H) 07/03/2023   ALBUMIN 4.8 10/08/2023   ALKPHOS 76 10/08/2023     ID No results found for: "LYMEIGGIGMAB", "HIV", "SARSCOV2NAA", "STAPHAUREUS", "MRSAPCR", "HCVAB", "PREGTESTUR", "RMSFIGG", "QFVRPH1IGG", "QFVRPH2IGG"   Bone Lab Results  Component Value Date   25OHVITD1 27 (L) 10/08/2023   25OHVITD2 <1.0 10/08/2023   25OHVITD3 26 10/08/2023     Endocrine Lab Results  Component Value Date   GLUCOSE 110 (H) 10/08/2023   GLUCOSEU NEGATIVE 01/14/2023   HGBA1C 6.9 (H) 10/09/2023   TSH 1.493 12/21/2022     Neuropathy Lab Results  Component Value Date   VITAMINB12 396 10/08/2023   HGBA1C 6.9 (H) 10/09/2023     CNS No results found for: "COLORCSF", "APPEARCSF", "RBCCOUNTCSF", "WBCCSF", "POLYSCSF", "LYMPHSCSF", "EOSCSF", "PROTEINCSF", "GLUCCSF", "JCVIRUS", "CSFOLI", "IGGCSF", "LABACHR", "ACETBL"   Inflammation (CRP: Acute  ESR: Chronic) Lab Results  Component Value Date   CRP <1 10/08/2023   ESRSEDRATE 23 10/08/2023   LATICACIDVEN 1.5 09/22/2016     Rheumatology No results found for: "RF", "ANA", "LABURIC",  "URICUR", "LYMEIGGIGMAB", "LYMEABIGMQN", "HLAB27"   Coagulation Lab Results  Component Value Date   PLT 266 12/21/2022     Cardiovascular Lab Results  Component Value Date   CKTOTAL 2,530 (HH) 07/03/2023   CKMB 5.8 (H) 01/26/2014   TROPONINI <0.03 01/18/2017   HGB 14.1 12/21/2022   HCT 42.8 12/21/2022     Screening No results found for: "SARSCOV2NAA", "COVIDSOURCE", "STAPHAUREUS", "MRSAPCR", "HCVAB", "HIV", "PREGTESTUR"   Cancer No results found for: "CEA", "CA125", "LABCA2"   Allergens No results found for: "ALMOND", "APPLE", "ASPARAGUS", "AVOCADO", "BANANA", "BARLEY", "BASIL", "BAYLEAF", "GREENBEAN", "LIMABEAN", "WHITEBEAN", "BEEFIGE", "REDBEET", "BLUEBERRY", "BROCCOLI", "CABBAGE", "MELON", "CARROT", "CASEIN", "CASHEWNUT", "CAULIFLOWER", "CELERY"     Note: Lab results reviewed.  Recent Diagnostic Imaging Review  Cervical Imaging: Cervical MR wo contrast: Results for orders placed  during the hospital encounter of 07/16/23 MR CERVICAL SPINE WO CONTRAST  Narrative CLINICAL DATA:  Chronic neck pain.  Cervicalgia.  EXAM: MRI CERVICAL SPINE WITHOUT CONTRAST  TECHNIQUE: Multiplanar, multisequence MR imaging of the cervical spine was performed. No intravenous contrast was administered.  COMPARISON:  None Available.  FINDINGS: Alignment: Physiologic.  Vertebrae: No fracture, evidence of discitis, or bone lesion. Incomplete segmentation at C2-3. degenerative marrow edema at C4-5.  Cord: Normal signal and morphology.  Posterior Fossa, vertebral arteries, paraspinal tissues: No evidence of perispinal mass or inflammation.  Disc levels:  C2-3: Incomplete segmentation.  No impingement.  C3-4: Disc space narrowing with mild bulging.  C4-5: Disc collapse with endplate and uncovertebral ridging. Biforaminal impingement. Bulging disc and ridging mildly indents the ventral cord.  C5-6: Disc space narrowing and bulging with right foraminal uncovertebral spur. Moderate  bilateral foraminal narrowing greater on the right. Patent spinal canal.  C6-7: Disc collapse with endplate and uncovertebral ridging. Biforaminal impingement. Patent spinal canal.  C7-T1:Mild facet spurring.  IMPRESSION: 1. Generalized cervical spine degeneration with notable Modic 1 changes at C4-5. 2. Biforaminal impingement at C4-5 to C6-7, least affecting C5-6. 3. Up to mild spinal stenosis at C4-5.   Electronically Signed By: Tiburcio Pea M.D. On: 07/23/2023 10:36  Cervical DG Bending/F/E views: Results for orders placed during the hospital encounter of 10/09/23 DG Cervical Spine With Flex & Extend  Narrative CLINICAL DATA:  Cervicalgia, chronic neck pain pain painful cervical range of motion. Decreased range of motion of intervertebral discs of cervical spine. Degenerative disc disease. Foraminal stenosis of cervical region. Spinal stenosis in cervical region. Abnormal MRI. Cervicogenic headache. Chronic neck pain.  EXAM: CERVICAL SPINE COMPLETE WITH FLEXION AND EXTENSION VIEWS  COMPARISON:  MRI 02/18/2023  FINDINGS: Mild straightening of normal lordosis. 1-2 mm retrolisthesis of C3 on C4 is unchanged on flexion and extension. C3-C4, C4-C5, and C5-C6 disc space narrowing and spurring. Moderate multilevel facet hypertrophy. Multilevel bony neural foraminal stenosis. No prevertebral soft tissue thickening.  IMPRESSION: 1. Degenerative disc disease C3-C4 through C5-C6. Moderate multilevel facet hypertrophy. 2. Multilevel bony neural foraminal stenosis. 3. Trace retrolisthesis of C3 on C4 is unchanged on flexion and extension.   Electronically Signed By: Narda Rutherford M.D. On: 10/15/2023 17:25  Shoulder Imaging: Shoulder-R DG: Results for orders placed during the hospital encounter of 10/09/23 DG Shoulder Right  Narrative CLINICAL DATA:  Chronic pain in both shoulders.  EXAM: RIGHT SHOULDER - 2+ VIEW  COMPARISON:  None  Available.  FINDINGS: The alignment is normal. Slight glenohumeral spurring. Mild acromioclavicular spurring. No fracture. No erosion or focal bone abnormality. No soft tissue calcifications.  IMPRESSION: Mild acromioclavicular and slight glenohumeral degenerative change.   Electronically Signed By: Narda Rutherford M.D. On: 10/15/2023 17:21  Shoulder-L DG: Results for orders placed during the hospital encounter of 10/09/23 DG Shoulder Left  Narrative CLINICAL DATA:  Chronic left shoulder pain.  EXAM: LEFT SHOULDER - 2+ VIEW  COMPARISON:  None Available.  FINDINGS: Normal alignment. Mild acromioclavicular spurring. Minor posterior glenoid spurring. Tiny subacromial spur. No fracture. No erosion or focal bone abnormality. There is slight subcortical cystic change in the lateral humeral head. No soft tissue calcifications.  IMPRESSION: 1. Mild acromioclavicular and minor glenohumeral degenerative change. 2. Subcortical cystic change in the lateral humeral head can be seen with rotator cuff arthropathy.   Electronically Signed By: Narda Rutherford M.D. On: 10/15/2023 17:22  Complexity Note: Imaging results reviewed.  Meds   Current Outpatient Medications:    amitriptyline (ELAVIL) 10 MG tablet, TAKE 1 TABLET BY MOUTH AT BEDTIME, Disp: 30 tablet, Rfl: 1   atorvastatin (LIPITOR) 20 MG tablet, Take 1 tablet (20 mg total) by mouth at bedtime. TAKE 1 TABLET BY MOUTH ONCE DAILY IN THE EVENING . APPOINTMENT REQUIRED FOR FUTURE REFILLS, Disp: 90 tablet, Rfl: 0   Cholecalciferol (VITAMIN D3) 125 MCG (5000 UT) CAPS, Take 1 capsule (5,000 Units total) by mouth daily with breakfast. Take along with calcium and magnesium., Disp: 90 capsule, Rfl: 0   clonazePAM (KLONOPIN) 0.5 MG tablet, Take 0.5 mg by mouth 3 (three) times daily as needed., Disp: , Rfl:    empagliflozin (JARDIANCE) 25 MG TABS tablet, TAKE 1 TABLET BY MOUTH ONCE DAILY BEFORE BREAKFAST, Disp:  30 tablet, Rfl: 1   ergocalciferol (VITAMIN D2) 1.25 MG (50000 UT) capsule, Take 1 capsule (50,000 Units total) by mouth 2 (two) times a week. X 6 weeks., Disp: 12 capsule, Rfl: 0   gabapentin (NEURONTIN) 400 MG capsule, Take 400 mg by mouth 2 (two) times daily., Disp: , Rfl:    indomethacin (INDOCIN) 50 MG capsule, TAKE 1 CAPSULE BY MOUTH THREE TIMES DAILY AS NEEDED FOR HEADACHE, Disp: 90 capsule, Rfl: 1   meloxicam (MOBIC) 15 MG tablet, Take 15 mg by mouth daily., Disp: , Rfl:    metFORMIN (GLUCOPHAGE) 1000 MG tablet, Take 1 tablet by mouth twice daily, Disp: 180 tablet, Rfl: 0   nortriptyline (PAMELOR) 10 MG capsule, Take by mouth., Disp: , Rfl:    nystatin (MYCOSTATIN) 100000 UNIT/ML suspension, Take 5 mLs (500,000 Units total) by mouth 4 (four) times daily., Disp: 60 mL, Rfl: 0   ondansetron (ZOFRAN-ODT) 4 MG disintegrating tablet, DISSOLVE 1 TABLET IN MOUTH EVERY 8 HOURS AS NEEDED FOR NAUSEA FOR VOMITING, Disp: 20 tablet, Rfl: 0   pantoprazole (PROTONIX) 40 MG tablet, Take 40 mg by mouth daily., Disp: , Rfl:    pioglitazone (ACTOS) 45 MG tablet, Take 1 tablet (45 mg total) by mouth every morning., Disp: 90 tablet, Rfl: 1   Rimegepant Sulfate (NURTEC) 75 MG TBDP, Take 1 tablet (75 mg total) by mouth daily as needed (headache)., Disp: 16 tablet, Rfl: 1   sertraline (ZOLOFT) 25 MG tablet, Take 25 mg by mouth daily., Disp: , Rfl:    sildenafil (VIAGRA) 100 MG tablet, TAKE ONE-HALF TO ONE TABLET BY MOUTH 30 MINUTES TO 4 HOURS BEFORE INTERCOURSE AS DIRECTED, Disp: 30 tablet, Rfl: 3   Zavegepant HCl (ZAVZPRET) 10 MG/ACT SOLN, Place 1 spray into the nose daily., Disp: 6 each, Rfl: 3   promethazine (PHENERGAN) 12.5 MG tablet, Take 1 tablet (12.5 mg total) by mouth every 6 (six) hours as needed for up to 7 days for nausea or vomiting. (Patient not taking: Reported on 09/05/2023), Disp: 20 tablet, Rfl: 0  ROS  Constitutional: Denies any fever or chills Gastrointestinal: No reported hemesis,  hematochezia, vomiting, or acute GI distress Musculoskeletal: Denies any acute onset joint swelling, redness, loss of ROM, or weakness Neurological: No reported episodes of acute onset apraxia, aphasia, dysarthria, agnosia, amnesia, paralysis, loss of coordination, or loss of consciousness  Allergies  Mr. Utz is allergic to other and pollen extract.  PFSH  Drug: Mr. Haff  reports no history of drug use. Alcohol:  reports no history of alcohol use. Tobacco:  reports that he has never smoked. He quit smokeless tobacco use about 41 years ago.  His smokeless tobacco use included chew. Medical:  has  a past medical history of Bipolar 1 disorder (HCC), Depression, Diabetes mellitus without complication (HCC), Keratoconus of right eye, Sepsis (HCC) (09/22/2016), and Septicemia (HCC) (09/22/2016). Surgical: Mr. Alegria  has a past surgical history that includes Eye surgery. Family: family history includes Breast cancer in his mother; Diabetes in his mother; Heart attack in his father.  Constitutional Exam  General appearance: Well nourished, well developed, and well hydrated. In no apparent acute distress Vitals:   10/29/23 0744  BP: 122/72  Pulse: 71  Resp: 16  Temp: (!) 97.2 F (36.2 C)  SpO2: 96%  Weight: 224 lb (101.6 kg)  Height: 5\' 11"  (1.803 m)   BMI Assessment: Estimated body mass index is 31.24 kg/m as calculated from the following:   Height as of this encounter: 5\' 11"  (1.803 m).   Weight as of this encounter: 224 lb (101.6 kg).  BMI interpretation table: BMI level Category Range association with higher incidence of chronic pain  <18 kg/m2 Underweight   18.5-24.9 kg/m2 Ideal body weight   25-29.9 kg/m2 Overweight Increased incidence by 20%  30-34.9 kg/m2 Obese (Class I) Increased incidence by 68%  35-39.9 kg/m2 Severe obesity (Class II) Increased incidence by 136%  >40 kg/m2 Extreme obesity (Class III) Increased incidence by 254%   Patient's current BMI Ideal Body  weight  Body mass index is 31.24 kg/m. Ideal body weight: 75.3 kg (166 lb 0.1 oz) Adjusted ideal body weight: 85.8 kg (189 lb 3.3 oz)   BMI Readings from Last 4 Encounters:  10/29/23 31.24 kg/m  10/10/23 31.35 kg/m  10/08/23 29.29 kg/m  09/05/23 30.89 kg/m   Wt Readings from Last 4 Encounters:  10/29/23 224 lb (101.6 kg)  10/10/23 224 lb 12.8 oz (102 kg)  10/08/23 210 lb (95.3 kg)  09/05/23 224 lb 9.6 oz (101.9 kg)    Psych/Mental status: Alert, oriented x 3 (person, place, & time)       Eyes: PERLA Respiratory: No evidence of acute respiratory distress  Assessment & Plan  Primary Diagnosis & Pertinent Problem List: The primary encounter diagnosis was Chronic neck pain (1ry area of Pain) (Midline) (Bilateral). 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 Visit Diagnosis: 1. Chronic neck pain (1ry area of Pain) (Midline) (Bilateral)   2. Cervicogenic headache (2ry area of Pain)   3. Chronic shoulder pain (3ry area of Pain) (Bilateral)   4. Cervicalgia   5. 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 Cervical facet joint arthropathy   8. Cervical facet hypertrophy (Multilevel) (Bilateral)   9. Cervical Grade 1 Retrolisthesis of C3/C4 (Stable)    10. DDD (degenerative disc disease), cervical   11. Decreased range of motion of intervertebral discs of cervical spine   12. Painful cervical range of motion   13. Chronic shoulder radicular pain   14. Cervical facet joint pain   15. Cervical facet syndrome   16. Abnormal MRI, cervical spine (07/23/2023)   17.  Vitamin D insufficiency   18. Elevated BUN   19. Elevated AST (SGOT)    Problems updated and reviewed during this visit: No problems updated.   Plan of Care  Assessment and Plan    Cervical Spondylosis with Multilevel Facet Hypertrophy Chronic neck pain, headaches, and shoulder pain are present, with imaging revealing disease from C3-4 to C5-6 levels, multilevel facet hypertrophy, and retroesthesia between C3 and C4, indicating vertebral body slippage. Symptoms include pain, crepitus, and limited neck movement. Conservative treatment options were discussed, including epidural steroid injections and physical therapy. The risks and benefits of steroid injections, such as temporary relief and diagnostic value, were explained. The decision was made to proceed with a cervical epidural steroid injection. Schedule the injection with sedation, refer to physical therapy for neck exercises, and advise bringing a driver and taking 24 hours off work post-procedure.  Vitamin D Insufficiency Vitamin D level is 27 ng/mL, below the normal range of 30-100 ng/mL. This insufficiency may worsen pain perception and affect bone health. The importance of vitamin D in calcium absorption and bone strength was discussed, along with the risks of pathological fractures and osteoporosis. Initiate vitamin D replacement therapy with one tablet twice a week for six weeks. Recommend over-the-counter vitamin D supplementation for maintenance and advise annual or biennial monitoring of vitamin D levels by the primary care physician.  Elevated BUN and BUN/Creatinine Ratio BUN is mildly elevated at 30 mg/dL, with a  BUN/creatinine ratio of 29, suggesting potential kidney function issues. Recommend follow-up with the primary care physician for further evaluation.  Urinary Incontinence There is difficulty controlling the bladder, with nocturnal enuresis and daytime accidents. Possible causes include bladder dysfunction, sphincter issues, or medication side effects. Recommend discussing with the primary care physician for further evaluation and management.  Follow-up Coordinate with the front desk to schedule the cervical epidural steroid injection. Confirm the availability of a driver and advise taking 24 hours off work post-procedure.        Pharmacotherapy (Medications Ordered): Meds ordered this encounter  Medications   ergocalciferol (VITAMIN D2) 1.25 MG (50000 UT) capsule    Sig: Take 1 capsule (50,000 Units total) by mouth 2 (two) times a week. X 6 weeks.    Dispense:  12 capsule    Refill:  0    Fill one day early if pharmacy is closed on scheduled refill date. May substitute for generic, or similar, if available.   Cholecalciferol (VITAMIN D3) 125 MCG (5000 UT) CAPS    Sig: Take 1 capsule (5,000 Units total) by mouth daily with breakfast. Take along with calcium and magnesium.    Dispense:  90 capsule    Refill:  0    Fill 1 day early if pharmacy is closed on scheduled refill date. Generic permitted. Do not send renewal requests.   Procedure Orders         Cervical Epidural Injection     Lab Orders  No laboratory test(s) ordered today   Imaging Orders  No imaging studies ordered today   Referral Orders         Ambulatory referral to Physical Therapy      Pharmacological management:  Opioid Analgesics: I will not be prescribing any opioids at this time Membrane stabilizer: I will not be prescribing any at this time Muscle relaxant: I will not be prescribing any at this time NSAID: I will not be prescribing any at this time Other analgesic(s): I will not be prescribing any at this  time  Interventional Therapies  Risk Factors  Considerations  Medical Comorbidities:  BPH  HTN  GERD  Hx. BNZ use  T2NIDDM  Bipolar     Planned  Pending:   Diagnostic right cervical ESI #1  Referral to physical therapy entered (10/29/2023)    Under consideration:   Diagnostic right cervical ESI #1 w/ possible series of 3.  Diagnostic bilateral cervical facet MBB #1 w/ possible follow-up RFA.    Completed:   None at this time   Therapeutic  Palliative (PRN) options:   None established   Completed by other providers:   None reported     Provider-requested follow-up: Return for (ECT): (ML) Cervical ESI #1. Recent Visits Date Type Provider Dept  10/29/23 Office Visit Delano Metz, MD [facility]  10/08/23 Office Visit Delano Metz, MD [facility]  Showing recent visits within past 90 days and meeting all other requirements Today's Visits Date Type Provider Dept  11/11/23 Appointment Delano Metz, MD [facility]  Showing today's visits and meeting all other requirements Future Appointments No visits were found meeting these conditions. Showing future appointments within next 90 days and meeting all other requirements   Primary Care Physician: Sherron Monday, MD  Duration of encounter: 90 minutes.  Total time on encounter, as per AMA guidelines included both the face-to-face and non-face-to-face time personally spent by the physician and/or other qualified health care professional(s) on the day of the encounter (includes time in activities that require the physician or other qualified health care professional and does not include time in activities normally performed by clinical staff). Physician's time may include the following activities when performed: Preparing to see the patient (e.g., pre-charting review of records, searching for previously ordered imaging, lab work, and nerve conduction tests) Review of  prior analgesic pharmacotherapies. Reviewing PMP Interpreting ordered tests (e.g., lab work, imaging, nerve conduction tests) Performing post-procedure evaluations, including interpretation of diagnostic procedures Obtaining and/or reviewing separately obtained history Performing a medically appropriate examination and/or evaluation Counseling and educating the patient/family/caregiver Ordering medications, tests, or procedures Referring and communicating with other health care professionals (when not separately reported) Documenting clinical information in the electronic or other health record Independently interpreting results (not separately reported) and communicating results to the patient/ family/caregiver Care coordination (not separately reported)  Note by: Oswaldo Done, MD (TTS technology used. I apologize for any typographical errors that were not detected and corrected.) Date: 10/29/2023; Time: 6:09 AM

## 2023-11-10 NOTE — Patient Instructions (Signed)

## 2023-11-10 NOTE — Progress Notes (Unsigned)
 PROVIDER NOTE: Interpretation of information contained herein should be left to medically-trained personnel. Specific patient instructions are provided elsewhere under "Patient Instructions" section of medical record. This document was created in part using STT-dictation technology, any transcriptional errors that may result from this process are unintentional.  Patient: Micheal Gay Type: Established DOB: March 22, 1966 MRN: [id number] PCP: Sherron Monday, MD  Service: Procedure DOS: 11/11/2023 Setting: Ambulatory Location: Ambulatory outpatient facility Delivery: Face-to-face Provider: Oswaldo Done, MD Specialty: Interventional Pain Management Specialty designation: 09 Location: Outpatient facility Ref. Prov.: Delano Metz, MD       Interventional Therapy   Procedure: Cervical Epidural Steroid injection (CESI) (Interlaminar) #1  Laterality: Midline  Level: C7-T1 Imaging: Fluoroscopy-assisted DOS: 11/11/2023  Performed by: Delano Metz, MD Anesthesia: Local anesthesia (1-2% Lidocaine) Anxiolysis:    ***                    Sedation:                           Purpose: Diagnostic/Therapeutic Indications: Cervicalgia, cervical radicular pain, degenerative disc disease, severe enough to impact quality of life or function. No diagnosis found. NAS-11 score:   Pre-procedure:  /10   Post-procedure:  /10      Position  Prep  Materials:  Location setting: Procedure suite Position: Prone, on modified reverse trendelenburg to facilitate breathing, with head in head-cradle. Pillows positioned under chest (below chin-level) with cervical spine flexed. Safety Precautions: Patient was assessed for positional comfort and pressure points before starting the procedure. Prepping solution: DuraPrep (Iodine Povacrylex [0.7% available iodine] and Isopropyl Alcohol, 74% w/w) Prep Area: Entire  cervicothoracic region Approach: percutaneous, paramedial Intended target: Posterior  cervical epidural space Materials Procedure:  Tray: Epidural Needle(s): Epidural (Tuohy) Qty: 1 Length: (90mm) 3.5-inch Gauge: 17G   H&P (Pre-op Assessment):  Micheal Gay is a 58 y.o. (year old), male patient, seen today for interventional treatment. He  has a past surgical history that includes Eye surgery. Micheal Gay has a current medication list which includes the following prescription(s): amitriptyline, atorvastatin, vitamin d3, clonazepam, jardiance, ergocalciferol, gabapentin, indomethacin, meloxicam, metformin, nortriptyline, nystatin, ondansetron, pantoprazole, pioglitazone, promethazine, nurtec, sertraline, sildenafil, and zavzpret. His primarily concern today is the No chief complaint on file.  Initial Vital Signs:  Pulse/HCG Rate:    Temp:   Resp:   BP:   SpO2:    BMI: Estimated body mass index is 31.24 kg/m as calculated from the following:   Height as of 10/29/23: 5\' 11"  (1.803 m).   Weight as of 10/29/23: 224 lb (101.6 kg).  Risk Assessment: Allergies: Reviewed. He is allergic to other and pollen extract.  Allergy Precautions: None required Coagulopathies: Reviewed. None identified.  Blood-thinner therapy: None at this time Active Infection(s): Reviewed. None identified. Micheal Gay is afebrile  Site Confirmation: Micheal Gay was asked to confirm the procedure and laterality before marking the site Procedure checklist: Completed Consent: Before the procedure and under the influence of no sedative(s), amnesic(s), or anxiolytics, the patient was informed of the treatment options, risks and possible complications. To fulfill our ethical and legal obligations, as recommended by the American Medical Association's Code of Ethics, I have informed the patient of my clinical impression; the nature and purpose of the treatment or procedure; the risks, benefits, and possible complications of the intervention; the alternatives, including doing nothing; the risk(s) and  benefit(s) of the alternative treatment(s) or procedure(s); and the risk(s) and benefit(s) of doing  nothing. The patient was provided information about the general risks and possible complications associated with the procedure. These may include, but are not limited to: failure to achieve desired goals, infection, bleeding, organ or nerve damage, allergic reactions, paralysis, and death. In addition, the patient was informed of those risks and complications associated to Spine-related procedures, such as failure to decrease pain; infection (i.e.: Meningitis, epidural or intraspinal abscess); bleeding (i.e.: epidural hematoma, subarachnoid hemorrhage, or any other type of intraspinal or peri-dural bleeding); organ or nerve damage (i.e.: Any type of peripheral nerve, nerve root, or spinal cord injury) with subsequent damage to sensory, motor, and/or autonomic systems, resulting in permanent pain, numbness, and/or weakness of one or several areas of the body; allergic reactions; (i.e.: anaphylactic reaction); and/or death. Furthermore, the patient was informed of those risks and complications associated with the medications. These include, but are not limited to: allergic reactions (i.e.: anaphylactic or anaphylactoid reaction(s)); adrenal axis suppression; blood sugar elevation that in diabetics may result in ketoacidosis or comma; water retention that in patients with history of congestive heart failure may result in shortness of breath, pulmonary edema, and decompensation with resultant heart failure; weight gain; swelling or edema; medication-induced neural toxicity; particulate matter embolism and blood vessel occlusion with resultant organ, and/or nervous system infarction; and/or aseptic necrosis of one or more joints. Finally, the patient was informed that Medicine is not an exact science; therefore, there is also the possibility of unforeseen or unpredictable risks and/or possible complications that may  result in a catastrophic outcome. The patient indicated having understood very clearly. We have given the patient no guarantees and we have made no promises. Enough time was given to the patient to ask questions, all of which were answered to the patient's satisfaction. Micheal Gay has indicated that he wanted to continue with the procedure. Attestation: I, the ordering provider, attest that I have discussed with the patient the benefits, risks, side-effects, alternatives, likelihood of achieving goals, and potential problems during recovery for the procedure that I have provided informed consent. Date  Time: {CHL [facility] TIME CHOICES:[id number]}   Pre-Procedure Preparation:  Monitoring: As per clinic protocol. Respiration, ETCO2, SpO2, BP, heart rate and rhythm monitor placed and checked for adequate function Safety Precautions: Patient was assessed for positional comfort and pressure points before starting the procedure. Time-out: I initiated and conducted the "Time-out" before starting the procedure, as per protocol. The patient was asked to participate by confirming the accuracy of the "Time Out" information. Verification of the correct person, site, and procedure were performed and confirmed by me, the nursing staff, and the patient. "Time-out" conducted as per Joint Commission's Universal Protocol (UP.01.01.01). Time:   Start Time:   hrs.  Description  Narrative of Procedure:          Rationale (medical necessity): procedure needed and proper for the diagnosis and/or treatment of the patient's medical symptoms and needs. Start Time:   hrs. Safety Precautions: Aspiration looking for blood return was conducted prior to all injections. At no point did we inject any substances, as a needle was being advanced. No attempts were made at seeking any paresthesias. Safe injection practices and needle disposal techniques used. Medications properly checked for expiration dates. SDV (single dose vial)  medications used. Description of procedure: Protocol guidelines were followed. The patient was assisted into a comfortable position. The target area was identified and the area prepped in the usual manner. Skin & deeper tissues infiltrated with local anesthetic. Appropriate amount of time allowed to  pass for local anesthetics to take effect. Using fluoroscopic guidance, the epidural needle was introduced through the skin, ipsilateral to the reported pain, and advanced to the target area. Posterior laminar os was contacted and the needle walked caudad, until the lamina was cleared. The ligamentum flavum was engaged and the epidural space identified using "loss-of-resistance technique" with 2-3 ml of PF-NaCl (0.9% NSS), in a 5cc dedicated LOR syringe. (See "Imaging guidance" below for use of contrast details.) Once proper needle placement was secured, and negative aspiration confirmed, the solution was injected in intermittent fashion, asking for systemic symptoms every 0.5cc. The needles were then removed and the area cleansed, making sure to leave some of the prepping solution back to take advantage of its long term bactericidal properties.  There were no vitals filed for this visit.   End Time:   hrs.  Imaging Guidance (Spinal):          Type of Imaging Technique: Fluoroscopy Guidance (Spinal) Indication(s): Fluoroscopy guidance for needle placement to enhance accuracy in procedures requiring precise needle localization for targeted delivery of medication in or near specific anatomical locations not easily accessible without such real-time imaging assistance. Exposure Time: Please see nurses notes. Contrast: Before injecting any contrast, we confirmed that the patient did not have an allergy to iodine, shellfish, or radiological contrast. Once satisfactory needle placement was completed at the desired level, radiological contrast was injected. Contrast injected under live fluoroscopy. No contrast  complications. See chart for type and volume of contrast used. Fluoroscopic Guidance: I was personally present during the use of fluoroscopy. "Tunnel Vision Technique" used to obtain the best possible view of the target area. Parallax error corrected before commencing the procedure. "Direction-depth-direction" technique used to introduce the needle under continuous pulsed fluoroscopy. Once target was reached, antero-posterior, oblique, and lateral fluoroscopic projection used confirm needle placement in all planes. Images permanently stored in EMR. Interpretation: I personally interpreted the imaging intraoperatively. Adequate needle placement confirmed in multiple planes. Appropriate spread of contrast into desired area was observed. No evidence of afferent or efferent intravascular uptake. No intrathecal or subarachnoid spread observed. Permanent images saved into the patient's record.  Post-operative Assessment:  Post-procedure Vital Signs:  Pulse/HCG Rate:    Temp:   Resp:   BP:   SpO2:    EBL: None  Complications: No immediate post-treatment complications observed by team, or reported by patient.  Note: The patient tolerated the entire procedure well. A repeat set of vitals were taken after the procedure and the patient was kept under observation following institutional policy, for this type of procedure. Post-procedural neurological assessment was performed, showing return to baseline, prior to discharge. The patient was provided with post-procedure discharge instructions, including a section on how to identify potential problems. Should any problems arise concerning this procedure, the patient was given instructions to immediately contact us, at any time, without hesitation. In any case, we plan to contact the patient by telephone for a follow-up status report regarding this interventional procedure.  Comments:  No additional relevant information.  Plan of Care (POC)  Orders:  No orders  of the defined types were placed in this encounter.  Chronic Opioid Analgesic:   None MME/day: 0 mg/day   Medications ordered for procedure: No orders of the defined types were placed in this encounter.  Medications administered: Dewaun A. Port had no medications administered during this visit.  See the medical record for exact dosing, route, and time of administration.  Follow-up plan:   No follow-ups  on file.       Interventional Therapies  Risk Factors  Considerations  Medical Comorbidities:  BPH  HTN  GERD  Hx. BNZ use  T2NIDDM  Bipolar     Planned  Pending:   Diagnostic right cervical ESI #1  Referral to physical therapy entered (10/29/2023)    Under consideration:   Diagnostic right cervical ESI #1 w/ possible series of 3.  Diagnostic bilateral cervical facet MBB #1 w/ possible follow-up RFA.    Completed:   None at this time   Therapeutic  Palliative (PRN) options:   None established   Completed by other providers:   None reported      Recent Visits Date Type Provider Dept  10/29/23 Office Visit Delano Metz, MD [facility]  10/08/23 Office Visit Delano Metz, MD [facility]  Showing recent visits within past 90 days and meeting all other requirements Future Appointments Date Type Provider Dept  11/11/23 Appointment Delano Metz, MD [facility]  Showing future appointments within next 90 days and meeting all other requirements  Disposition: Discharge home  Discharge (Date  Time): 11/11/2023;   hrs.   Primary Care Physician: Sherron Monday, MD Location: [facility] Note by: Oswaldo Done, MD (TTS technology used. I apologize for any typographical errors that were not detected and corrected.) Date: 11/11/2023; Time: 7:16 AM  Disclaimer:  Medicine is not an Visual merchandiser. The only guarantee in medicine is that nothing is guaranteed. It is important to note  that the decision to proceed with this intervention was based on the information collected from the patient. The Data and conclusions were drawn from the patient's questionnaire, the interview, and the physical examination. Because the information was provided in large part by the patient, it cannot be guaranteed that it has not been purposely or unconsciously manipulated. Every effort has been made to obtain as much relevant data as possible for this evaluation. It is important to note that the conclusions that lead to this procedure are derived in large part from the available data. Always take into account that the treatment will also be dependent on availability of resources and existing treatment guidelines, considered by other Pain Management Practitioners as being common knowledge and practice, at the time of the intervention. For Medico-Legal purposes, it is also important to point out that variation in procedural techniques and pharmacological choices are the acceptable norm. The indications, contraindications, technique, and results of the above procedure should only be interpreted and judged by a Board-Certified Interventional Pain Specialist with extensive familiarity and expertise in the same exact procedure and technique.

## 2023-11-11 ENCOUNTER — Ambulatory Visit: Payer: BC Managed Care – PPO | Attending: Pain Medicine | Admitting: Pain Medicine

## 2023-11-11 DIAGNOSIS — Z91199 Patient's noncompliance with other medical treatment and regimen due to unspecified reason: Secondary | ICD-10-CM

## 2023-11-11 DIAGNOSIS — Z5189 Encounter for other specified aftercare: Secondary | ICD-10-CM

## 2023-11-15 ENCOUNTER — Other Ambulatory Visit: Payer: Self-pay | Admitting: Internal Medicine

## 2023-11-15 DIAGNOSIS — E119 Type 2 diabetes mellitus without complications: Secondary | ICD-10-CM

## 2023-11-17 ENCOUNTER — Telehealth: Payer: Self-pay | Admitting: Pain Medicine

## 2023-11-17 ENCOUNTER — Telehealth: Payer: Self-pay

## 2023-11-17 NOTE — Telephone Encounter (Signed)
 PT has a appt schedule for tomorrow wants to have procedure done without sedation. FYI

## 2023-11-17 NOTE — Telephone Encounter (Signed)
 Dr Laban Emperor notified and scheduled changed to reflect the request.

## 2023-11-18 ENCOUNTER — Ambulatory Visit
Admission: RE | Admit: 2023-11-18 | Discharge: 2023-11-18 | Disposition: A | Discharge status: Home / Self Care | Source: Ambulatory Visit | Attending: Pain Medicine | Admitting: Pain Medicine

## 2023-11-18 ENCOUNTER — Ambulatory Visit: Attending: Pain Medicine | Admitting: Pain Medicine

## 2023-11-18 ENCOUNTER — Encounter: Payer: Self-pay | Admitting: Pain Medicine

## 2023-11-18 VITALS — BP 123/88 | HR 65 | Temp 98.2°F | Resp 12 | Ht 71.5 in | Wt 224.0 lb

## 2023-11-18 DIAGNOSIS — G8929 Other chronic pain: Secondary | ICD-10-CM | POA: Insufficient documentation

## 2023-11-18 DIAGNOSIS — R937 Abnormal findings on diagnostic imaging of other parts of musculoskeletal system: Secondary | ICD-10-CM | POA: Insufficient documentation

## 2023-11-18 DIAGNOSIS — G4486 Cervicogenic headache: Secondary | ICD-10-CM | POA: Insufficient documentation

## 2023-11-18 DIAGNOSIS — M5382 Other specified dorsopathies, cervical region: Secondary | ICD-10-CM | POA: Insufficient documentation

## 2023-11-18 DIAGNOSIS — M47812 Spondylosis without myelopathy or radiculopathy, cervical region: Secondary | ICD-10-CM | POA: Diagnosis present

## 2023-11-18 DIAGNOSIS — M25512 Pain in left shoulder: Secondary | ICD-10-CM | POA: Diagnosis present

## 2023-11-18 DIAGNOSIS — M4310 Spondylolisthesis, site unspecified: Secondary | ICD-10-CM | POA: Diagnosis present

## 2023-11-18 DIAGNOSIS — M5030 Other cervical disc degeneration, unspecified cervical region: Secondary | ICD-10-CM | POA: Insufficient documentation

## 2023-11-18 DIAGNOSIS — M4802 Spinal stenosis, cervical region: Secondary | ICD-10-CM | POA: Diagnosis present

## 2023-11-18 DIAGNOSIS — M25511 Pain in right shoulder: Secondary | ICD-10-CM | POA: Insufficient documentation

## 2023-11-18 DIAGNOSIS — M542 Cervicalgia: Secondary | ICD-10-CM | POA: Insufficient documentation

## 2023-11-18 MED ORDER — SODIUM CHLORIDE 0.9% FLUSH
1.0000 mL | Freq: Once | INTRAVENOUS | Status: AC
Start: 2023-11-18 — End: 2023-11-18
  Administered 2023-11-18: 1 mL

## 2023-11-18 MED ORDER — PENTAFLUOROPROP-TETRAFLUOROETH EX AERO
INHALATION_SPRAY | Freq: Once | CUTANEOUS | Status: AC
Start: 2023-11-18 — End: 2023-11-18
  Administered 2023-11-18: 30 via TOPICAL

## 2023-11-18 MED ORDER — SODIUM CHLORIDE (PF) 0.9 % IJ SOLN
INTRAMUSCULAR | Status: AC
Start: 2023-11-18 — End: ?
  Filled 2023-11-18: qty 10

## 2023-11-18 MED ORDER — ROPIVACAINE HCL 2 MG/ML IJ SOLN
1.0000 mL | Freq: Once | INTRAMUSCULAR | Status: AC
  Administered 2023-11-18: 1 mL via EPIDURAL
  Filled 2023-11-18: qty 20

## 2023-11-18 MED ORDER — LIDOCAINE HCL 2 % IJ SOLN
20.0000 mL | Freq: Once | INTRAMUSCULAR | Status: AC
  Administered 2023-11-18: 400 mg
  Filled 2023-11-18: qty 20

## 2023-11-18 MED ORDER — DEXAMETHASONE SODIUM PHOSPHATE 10 MG/ML IJ SOLN
10.0000 mg | Freq: Once | INTRAMUSCULAR | Status: AC
Start: 2023-11-18 — End: 2023-11-18
  Administered 2023-11-18: 10 mg
  Filled 2023-11-18: qty 1

## 2023-11-18 MED ORDER — IOHEXOL 180 MG/ML  SOLN
10.0000 mL | Freq: Once | INTRAMUSCULAR | Status: AC
  Administered 2023-11-18: 10 mL via EPIDURAL
  Filled 2023-11-18: qty 20

## 2023-11-18 NOTE — Patient Instructions (Signed)

## 2023-11-18 NOTE — Progress Notes (Signed)
 PROVIDER NOTE: Interpretation of information contained herein should be left to medically-trained personnel. Specific patient instructions are provided elsewhere under "Patient Instructions" section of medical record. This document was created in part using STT-dictation technology, any transcriptional errors that may result from this process are unintentional.  Patient: Micheal Gay Type: Established DOB: 1966/05/17 MRN: [id number] PCP: Sherron Monday, MD  Service: Procedure DOS: 11/18/2023 Setting: Ambulatory Location: Ambulatory outpatient facility Delivery: Face-to-face Provider: Oswaldo Done, MD Specialty: Interventional Pain Management Specialty designation: 09 Location: Outpatient facility Ref. Prov.: Sherron Monday, MD       Interventional Therapy   Procedure: Cervical Epidural Steroid injection (CESI) (Interlaminar) #1  Laterality: Midline  Level: C7-T1 Imaging: Fluoroscopy-assisted DOS: 11/18/2023  Performed by: Delano Metz, MD Anesthesia: Local anesthesia (1-2% Lidocaine) Anxiolysis: None                 Sedation: No Sedation                         Purpose: Diagnostic/Therapeutic Indications: Cervicalgia, cervical radicular pain, degenerative disc disease, severe enough to impact quality of life or function. 1. Chronic neck pain (1ry area of Pain) (Midline) (Bilateral)   2. Cervicogenic headache (2ry area of Pain)   3. Cervicalgia   4. Cervical Grade 1 Retrolisthesis of C3/C4 (Stable)   5. 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 Cervical facet hypertrophy (Multilevel) (Bilateral)   8. Chronic shoulder pain (3ry area of Pain) (Bilateral)   9. DDD (degenerative disc disease), cervical   10. Decreased range of motion of intervertebral discs of cervical spine   11. Painful cervical range of motion   12. Abnormal MRI, cervical spine (07/23/2023)    NAS-11 score:   Pre-procedure:  9 /10   Post-procedure: 9 /10      Position  Prep  Materials:  Location setting: Procedure suite Position: Prone, on modified reverse trendelenburg to facilitate breathing, with head in head-cradle. Pillows positioned under chest (below chin-level) with cervical spine flexed. Safety Precautions: Patient was assessed for positional comfort and pressure points before starting the procedure. Prepping solution: DuraPrep (Iodine Povacrylex [0.7% available iodine] and Isopropyl Alcohol, 74% w/w) Prep Area: Entire  cervicothoracic region Approach: percutaneous, paramedial Intended target: Posterior cervical epidural space Materials Procedure:  Tray: Epidural Needle(s): Epidural (Tuohy) Qty: 1 Length: (90mm) 3.5-inch Gauge: 17G   H&P (Pre-op Assessment):  Mr. Lebeau is a 58 y.o. (year old), male patient, seen today for interventional treatment. He  has a past surgical history that includes Eye surgery. Mr. Plog has a current medication list which includes the following prescription(s): amitriptyline, atorvastatin, vitamin d3, clonazepam, jardiance, ergocalciferol, gabapentin, indomethacin, meloxicam, metformin, nortriptyline, nystatin, ondansetron, pantoprazole, pioglitazone, nurtec, sertraline, sildenafil, and zavzpret. His primarily concern today is the Neck Pain  Initial Vital Signs:  Pulse/HCG Rate: 66  Temp: 98.2 F (36.8 C) Resp: 16 BP: 126/73 SpO2: 96 %  BMI: Estimated body mass index is 30.81 kg/m as calculated from the following:   Height as of this encounter: 5' 11.5" (1.816 m).   Weight as of this encounter: 224 lb (101.6 kg).  Risk Assessment: Allergies: Reviewed. He is allergic to other and pollen extract.  Allergy Precautions: None required Coagulopathies: Reviewed. None identified.  Blood-thinner therapy: None at this time Active Infection(s): Reviewed. None identified. Mr. Hlavaty is afebrile  Site Confirmation: Mr. Cuffee was asked to confirm the  procedure and laterality  before marking the site Procedure checklist: Completed Consent: Before the procedure and under the influence of no sedative(s), amnesic(s), or anxiolytics, the patient was informed of the treatment options, risks and possible complications. To fulfill our ethical and legal obligations, as recommended by the American Medical Association's Code of Ethics, I have informed the patient of my clinical impression; the nature and purpose of the treatment or procedure; the risks, benefits, and possible complications of the intervention; the alternatives, including doing nothing; the risk(s) and benefit(s) of the alternative treatment(s) or procedure(s); and the risk(s) and benefit(s) of doing nothing. The patient was provided information about the general risks and possible complications associated with the procedure. These may include, but are not limited to: failure to achieve desired goals, infection, bleeding, organ or nerve damage, allergic reactions, paralysis, and death. In addition, the patient was informed of those risks and complications associated to Spine-related procedures, such as failure to decrease pain; infection (i.e.: Meningitis, epidural or intraspinal abscess); bleeding (i.e.: epidural hematoma, subarachnoid hemorrhage, or any other type of intraspinal or peri-dural bleeding); organ or nerve damage (i.e.: Any type of peripheral nerve, nerve root, or spinal cord injury) with subsequent damage to sensory, motor, and/or autonomic systems, resulting in permanent pain, numbness, and/or weakness of one or several areas of the body; allergic reactions; (i.e.: anaphylactic reaction); and/or death. Furthermore, the patient was informed of those risks and complications associated with the medications. These include, but are not limited to: allergic reactions (i.e.: anaphylactic or anaphylactoid reaction(s)); adrenal axis suppression; blood sugar elevation that in diabetics may result  in ketoacidosis or comma; water retention that in patients with history of congestive heart failure may result in shortness of breath, pulmonary edema, and decompensation with resultant heart failure; weight gain; swelling or edema; medication-induced neural toxicity; particulate matter embolism and blood vessel occlusion with resultant organ, and/or nervous system infarction; and/or aseptic necrosis of one or more joints. Finally, the patient was informed that Medicine is not an exact science; therefore, there is also the possibility of unforeseen or unpredictable risks and/or possible complications that may result in a catastrophic outcome. The patient indicated having understood very clearly. We have given the patient no guarantees and we have made no promises. Enough time was given to the patient to ask questions, all of which were answered to the patient's satisfaction. Mr. Herrington has indicated that he wanted to continue with the procedure. Attestation: I, the ordering provider, attest that I have discussed with the patient the benefits, risks, side-effects, alternatives, likelihood of achieving goals, and potential problems during recovery for the procedure that I have provided informed consent. Date  Time: 11/18/2023  9:50 AM   Pre-Procedure Preparation:  Monitoring: As per clinic protocol. Respiration, ETCO2, SpO2, BP, heart rate and rhythm monitor placed and checked for adequate function Safety Precautions: Patient was assessed for positional comfort and pressure points before starting the procedure. Time-out: I initiated and conducted the "Time-out" before starting the procedure, as per protocol. The patient was asked to participate by confirming the accuracy of the "Time Out" information. Verification of the correct person, site, and procedure were performed and confirmed by me, the nursing staff, and the patient. "Time-out" conducted as per Joint Commission's Universal Protocol  (UP.01.01.01). Time: 1108 Start Time: 1108 hrs.  Description  Narrative of Procedure:          Rationale (medical necessity): procedure needed and proper for the diagnosis and/or treatment of the patient's medical symptoms and needs. Start Time: 1108 hrs. Safety  Precautions: Aspiration looking for blood return was conducted prior to all injections. At no point did we inject any substances, as a needle was being advanced. No attempts were made at seeking any paresthesias. Safe injection practices and needle disposal techniques used. Medications properly checked for expiration dates. SDV (single dose vial) medications used. Description of procedure: Protocol guidelines were followed. The patient was assisted into a comfortable position. The target area was identified and the area prepped in the usual manner. Skin & deeper tissues infiltrated with local anesthetic. Appropriate amount of time allowed to pass for local anesthetics to take effect. Using fluoroscopic guidance, the epidural needle was introduced through the skin, ipsilateral to the reported pain, and advanced to the target area. Posterior laminar os was contacted and the needle walked caudad, until the lamina was cleared. The ligamentum flavum was engaged and the epidural space identified using "loss-of-resistance technique" with 2-3 ml of PF-NaCl (0.9% NSS), in a 5cc dedicated LOR syringe. (See "Imaging guidance" below for use of contrast details.) Once proper needle placement was secured, and negative aspiration confirmed, the solution was injected in intermittent fashion, asking for systemic symptoms every 0.5cc. The needles were then removed and the area cleansed, making sure to leave some of the prepping solution back to take advantage of its long term bactericidal properties.  Vitals:   11/18/23 0946 11/18/23 1106 11/18/23 1111  BP: 126/73 119/88 123/88  Pulse: 66 69 65  Resp: 16 13 12   Temp: 98.2 F (36.8 C)    TempSrc: Temporal     SpO2: 96% 99% 99%  Weight: 224 lb (101.6 kg)    Height: 5' 11.5" (1.816 m)       End Time: 1115 hrs.  Imaging Guidance (Spinal):          Type of Imaging Technique: Fluoroscopy Guidance (Spinal) Indication(s): Fluoroscopy guidance for needle placement to enhance accuracy in procedures requiring precise needle localization for targeted delivery of medication in or near specific anatomical locations not easily accessible without such real-time imaging assistance. Exposure Time: Please see nurses notes. Contrast: Before injecting any contrast, we confirmed that the patient did not have an allergy to iodine, shellfish, or radiological contrast. Once satisfactory needle placement was completed at the desired level, radiological contrast was injected. Contrast injected under live fluoroscopy. No contrast complications. See chart for type and volume of contrast used. Fluoroscopic Guidance: I was personally present during the use of fluoroscopy. "Tunnel Vision Technique" used to obtain the best possible view of the target area. Parallax error corrected before commencing the procedure. "Direction-depth-direction" technique used to introduce the needle under continuous pulsed fluoroscopy. Once target was reached, antero-posterior, oblique, and lateral fluoroscopic projection used confirm needle placement in all planes. Images permanently stored in EMR. Interpretation: I personally interpreted the imaging intraoperatively. Adequate needle placement confirmed in multiple planes. Appropriate spread of contrast into desired area was observed. No evidence of afferent or efferent intravascular uptake. No intrathecal or subarachnoid spread observed. Permanent images saved into the patient's record.  Post-operative Assessment:  Post-procedure Vital Signs:  Pulse/HCG Rate: 65  Temp: 98.2 F (36.8 C) Resp: 12 BP: 123/88 SpO2: 99 %  EBL: None  Complications: No immediate post-treatment complications observed  by team, or reported by patient.  Note: The patient tolerated the entire procedure well. A repeat set of vitals were taken after the procedure and the patient was kept under observation following institutional policy, for this type of procedure. Post-procedural neurological assessment was performed, showing return to baseline, prior  to discharge. The patient was provided with post-procedure discharge instructions, including a section on how to identify potential problems. Should any problems arise concerning this procedure, the patient was given instructions to immediately contact us, at any time, without hesitation. In any case, we plan to contact the patient by telephone for a follow-up status report regarding this interventional procedure.  Comments:  No additional relevant information.  Plan of Care (POC)  Orders:  Orders Placed This Encounter  Procedures   Cervical Epidural Injection    Indication(s): Radiculitis and cervicalgia associated with cervical degenerative disc disease. Position: Prone Imaging guidance: Fluoroscopy required. Contrast required unless contraindicated by allergy or severe CKD. Equipment & Materials: Epidural tray & needle.    Scheduling Instructions:     Procedure: Cervical Epidural Steroid Injection/Block     Planned Level(s): C7-T1     Laterality: Midline     Anxiolysis: Patient's choice.     Timeframe: Today    Where will this procedure be performed?:   [facility]             by Dr. Conception Oms [facility] C-[facility] 1-60 MIN NO REPORT    Intraoperative interpretation by procedural physician at [facility].    Standing Status:   Standing    Number of Occurrences:   1    Reason for exam::   Assistance in needle guidance and placement for procedures requiring needle placement in or near specific anatomical locations not easily accessible without such assistance.   Informed Consent Details: Physician/Practitioner Attestation; Transcribe to  consent form and obtain patient signature    Nursing instructions: Transcribe to consent form and obtain patient signature. Always confirm laterality of pain with Mr. Macnaughton, before procedure.    Physician/Practitioner attestation of informed consent for procedure/surgical case:   I, the physician/practitioner, attest that I have discussed with the patient the benefits, risks, side effects, alternatives, likelihood of achieving goals and potential problems during recovery for the procedure that I have provided informed consent.    Procedure:   Cervical Epidural Steroid Injection (CESI) under fluoroscopic guidance    Physician/Practitioner performing the procedure:   Brayah Urquilla A. Laban Emperor MD    Indication/Reason:   Indications: Cervicalgia (neck pain), cervical radicular pain, radiculitis (arm/shoulder pain, numbness, and/or weakness), degenerative disc disease, severe enough to greatly impact quality of life or function.   Provide equipment / supplies at bedside    Procedural tray: Epidural Tray (Disposable  single use) Skin infiltration needle: Regular 1.5-in, 25-G, (x1) Block needle size: Regular standard Catheter: No catheter required    Standing Status:   Standing    Number of Occurrences:   1    Specify:   Epidural Tray   Saline lock IV    Have LR [phone] mL available and administer at 125 mL/hr if patient becomes hypotensive.    Standing Status:   Standing    Number of Occurrences:   1   Chronic Opioid Analgesic:   None MME/day: 0 mg/day   Medications ordered for procedure: Meds ordered this encounter  Medications   iohexol (OMNIPAQUE) 180 MG/ML injection 10 mL    Must be Myelogram-compatible. If not available, you may substitute with a water-soluble, non-ionic, hypoallergenic, myelogram-compatible radiological contrast medium.   lidocaine (XYLOCAINE) 2 % (with pres) injection 400 mg   pentafluoroprop-tetrafluoroeth (GEBAUERS) aerosol   sodium chloride flush (NS) 0.9 %  injection 1 mL   ropivacaine (PF) 2 mg/mL (0.2%) (NAROPIN) injection 1 mL   dexamethasone (  DECADRON) injection 10 mg   Medications administered: We administered iohexol, lidocaine, pentafluoroprop-tetrafluoroeth, sodium chloride flush, ropivacaine (PF) 2 mg/mL (0.2%), and dexamethasone.  See the medical record for exact dosing, route, and time of administration.  Follow-up plan:   Return in about 2 weeks (around 12/02/2023) for (Face2F), (PPE).       Interventional Therapies  Risk Factors  Considerations  Medical Comorbidities:  BPH  HTN  GERD  Hx. BNZ use  T2NIDDM  Bipolar     Planned  Pending:   Diagnostic right cervical ESI #1  Referral to physical therapy entered (10/29/2023)    Under consideration:   Diagnostic right cervical ESI #1 w/ possible series of 3.  Diagnostic bilateral cervical facet MBB #1 w/ possible follow-up RFA.    Completed:   None at this time   Therapeutic  Palliative (PRN) options:   None established   Completed by other providers:   None reported     Recent Visits Date Type Provider Dept  10/29/23 Office Visit Delano Metz, MD [facility]  10/08/23 Office Visit Delano Metz, MD [facility]  Showing recent visits within past 90 days and meeting all other requirements Today's Visits Date Type Provider Dept  11/18/23 Procedure visit Delano Metz, MD [facility]  Showing today's visits and meeting all other requirements Future Appointments No visits were found meeting these conditions. Showing future appointments within next 90 days and meeting all other requirements  Disposition: Discharge home  Discharge (Date  Time): 11/18/2023;   hrs.   Primary Care Physician: Sherron Monday, MD Location: [facility] Note by: Oswaldo Done, MD (TTS technology used. I apologize for any typographical errors that were not detected and corrected.) Date: 11/18/2023;  Time: 11:19 AM  Disclaimer:  Medicine is not an Visual merchandiser. The only guarantee in medicine is that nothing is guaranteed. It is important to note that the decision to proceed with this intervention was based on the information collected from the patient. The Data and conclusions were drawn from the patient's questionnaire, the interview, and the physical examination. Because the information was provided in large part by the patient, it cannot be guaranteed that it has not been purposely or unconsciously manipulated. Every effort has been made to obtain as much relevant data as possible for this evaluation. It is important to note that the conclusions that lead to this procedure are derived in large part from the available data. Always take into account that the treatment will also be dependent on availability of resources and existing treatment guidelines, considered by other Pain Management Practitioners as being common knowledge and practice, at the time of the intervention. For Medico-Legal purposes, it is also important to point out that variation in procedural techniques and pharmacological choices are the acceptable norm. The indications, contraindications, technique, and results of the above procedure should only be interpreted and judged by a Board-Certified Interventional Pain Specialist with extensive familiarity and expertise in the same exact procedure and technique.

## 2023-11-18 NOTE — Progress Notes (Signed)
 Safety precautions to be maintained throughout the outpatient stay will include: orient to surroundings, keep bed in low position, maintain call bell within reach at all times, provide assistance with transfer out of bed and ambulation.

## 2023-11-19 ENCOUNTER — Telehealth: Payer: Self-pay | Admitting: Internal Medicine

## 2023-11-19 ENCOUNTER — Telehealth: Payer: Self-pay

## 2023-11-19 NOTE — Telephone Encounter (Signed)
 Patient left VM stating that he is "having a problem and needs someone to call him back".  Did not leave any details.

## 2023-11-19 NOTE — Telephone Encounter (Signed)
 Post procedure follow up.  LM

## 2023-12-08 NOTE — Progress Notes (Unsigned)
 Unsuccessful attempt to contact patient for Virtual Visit (Pain Management Telehealth)   Patient provided contact information:  [phone] (home); [phone] (mobile); (Preferred) [phone] [email or web address]   Pre-screening:  Our staff was successful in contacting Micheal Gay using the above provided information.   Nursing unsuccessfully attempted to make contact with Micheal Gay on 12/09/2023 via telephone. They were unable to complete the virtual encounter due to call going directly to voicemail. Nursing was able to leave a message.  Pharmacotherapy Assessment  Analgesic: No chronic opioid analgesics therapy prescribed by our practice. None. MME/day: 0 mg/day   Follow-up plan:   Reschedule Visit.     Interventional Therapies  Risk Factors  Considerations  Medical Comorbidities:  BPH  HTN  GERD  Hx. BNZ use  T2NIDDM  Bipolar     Planned  Pending:   Diagnostic right cervical ESI #1  Referral to physical therapy entered (10/29/2023)    Under consideration:   Diagnostic right cervical ESI #1 w/ possible series of 3.  Diagnostic bilateral cervical facet MBB #1 w/ possible follow-up RFA.    Completed:   None at this time   Therapeutic  Palliative (PRN) options:   None established   Completed by other providers:   None reported     Recent Visits Date Type Provider Dept  12/09/23 Office Visit Delano Metz, MD [facility]  11/18/23 Procedure visit Delano Metz, MD [facility]  10/29/23 Office Visit Delano Metz, MD [facility]  10/08/23 Office Visit Delano Metz, MD [facility]  Showing recent visits within past 90 days and meeting all other requirements Future Appointments No visits were found meeting these conditions. Showing future appointments within next 90 days and meeting all other requirements   Note by: Oswaldo Done, MD Date: 12/09/2023; Time: 5:31 AM

## 2023-12-09 ENCOUNTER — Other Ambulatory Visit: Payer: Self-pay

## 2023-12-09 ENCOUNTER — Ambulatory Visit: Attending: Pain Medicine | Admitting: Pain Medicine

## 2023-12-09 DIAGNOSIS — M542 Cervicalgia: Secondary | ICD-10-CM

## 2023-12-09 DIAGNOSIS — Z91199 Patient's noncompliance with other medical treatment and regimen due to unspecified reason: Secondary | ICD-10-CM

## 2023-12-09 DIAGNOSIS — M5382 Other specified dorsopathies, cervical region: Secondary | ICD-10-CM

## 2023-12-09 DIAGNOSIS — M4802 Spinal stenosis, cervical region: Secondary | ICD-10-CM

## 2023-12-09 DIAGNOSIS — G8929 Other chronic pain: Secondary | ICD-10-CM

## 2023-12-09 DIAGNOSIS — G4486 Cervicogenic headache: Secondary | ICD-10-CM

## 2023-12-09 DIAGNOSIS — Z09 Encounter for follow-up examination after completed treatment for conditions other than malignant neoplasm: Secondary | ICD-10-CM

## 2023-12-09 NOTE — Telephone Encounter (Signed)
 Left message for patient to call office for pre virtual appt questions.

## 2023-12-16 ENCOUNTER — Other Ambulatory Visit: Payer: Self-pay | Admitting: Internal Medicine

## 2023-12-16 DIAGNOSIS — K219 Gastro-esophageal reflux disease without esophagitis: Secondary | ICD-10-CM

## 2023-12-16 DIAGNOSIS — R1319 Other dysphagia: Secondary | ICD-10-CM

## 2023-12-19 ENCOUNTER — Ambulatory Visit
Admission: RE | Admit: 2023-12-19 | Discharge: 2023-12-19 | Disposition: A | Discharge status: Home / Self Care | Source: Ambulatory Visit | Attending: Internal Medicine | Admitting: Internal Medicine

## 2023-12-19 DIAGNOSIS — R1319 Other dysphagia: Secondary | ICD-10-CM | POA: Diagnosis present

## 2023-12-19 DIAGNOSIS — K219 Gastro-esophageal reflux disease without esophagitis: Secondary | ICD-10-CM | POA: Diagnosis present

## 2024-01-16 ENCOUNTER — Encounter: Payer: BC Managed Care – PPO | Admitting: Internal Medicine

## 2024-01-16 ENCOUNTER — Other Ambulatory Visit: Payer: Self-pay | Admitting: Internal Medicine

## 2024-01-16 DIAGNOSIS — E119 Type 2 diabetes mellitus without complications: Secondary | ICD-10-CM

## 2024-01-19 ENCOUNTER — Encounter: Payer: Self-pay | Admitting: Internal Medicine

## 2024-01-21 ENCOUNTER — Other Ambulatory Visit: Payer: Self-pay | Admitting: Internal Medicine

## 2024-01-21 DIAGNOSIS — E782 Mixed hyperlipidemia: Secondary | ICD-10-CM

## 2024-01-30 ENCOUNTER — Encounter: Admitting: Internal Medicine

## 2024-02-03 NOTE — Progress Notes (Unsigned)
 PROVIDER NOTE: Interpretation of information contained herein should be left to medically-trained personnel. Specific patient instructions are provided elsewhere under "Patient Instructions" section of medical record. This document was created in part using AI and STT-dictation technology, any transcriptional errors that may result from this process are unintentional.  Patient: Micheal Gay  Service: E/M   PCP: Shari Daughters, MD  DOB: 06-22-1966  DOS: 02/04/2024  Provider: Candi Chafe, MD  MRN: [id number]  Delivery: Face-to-face  Specialty: Interventional Pain Management  Type: Established Patient  Setting: Ambulatory outpatient facility  Specialty designation: 09  Referring Prov.: Shari Daughters, MD  Location: Outpatient office facility       History of present illness (HPI) Micheal Gay, a 57 y.o. year old male, is here today because of his Chronic neck pain [M54.2, G89.29]. Micheal Gay primary complain today is No chief complaint on file.  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  Pain Assessment: Severity of   is reported as a  /10. Location:    / . Onset:  . Quality:  . Timing:  . Modifying factor(s):  Aaron Aas Vitals:  vitals were not  taken for this visit.  BMI: Estimated body mass index is 30.81 kg/m as calculated from the following:   Height as of 11/18/23: 5' 11.5" (1.816 m).   Weight as of 11/18/23: 224 lb (101.6 kg).  Last encounter: 12/09/2023. Last procedure: 11/18/2023.  Reason for encounter: evaluation of worsening, or previously known (established) problem.   Discussed the use of AI scribe software for clinical note transcription with the patient, who gave verbal consent to proceed.  History of Present Illness         Post-procedure evaluation   Procedure: Cervical Epidural Steroid injection (CESI) (Interlaminar) #1  Laterality: Midline  Level: C7-T1 Imaging: Fluoroscopy-assisted DOS: 11/18/2023  Performed by: Renaldo Caroli, MD Anesthesia: Local anesthesia (1-2% Lidocaine ) Anxiolysis: None                 Sedation: No Sedation                         Purpose: Diagnostic/Therapeutic Indications: Cervicalgia, cervical radicular pain, degenerative disc disease, severe enough to impact quality of life or function. 1. Chronic neck pain (1ry area of Pain) (Midline) (Bilateral)   2. Cervicogenic headache (2ry area of Pain)   3. Cervicalgia   4. Cervical Grade 1 Retrolisthesis of C3/C4 (Stable)   5. 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 Cervical facet hypertrophy (Multilevel) (Bilateral)   8. Chronic shoulder pain (3ry area of Pain) (Bilateral)   9. DDD (degenerative disc disease), cervical   10. Decreased range of motion of intervertebral discs of cervical spine   11. Painful cervical range of motion   12. Abnormal MRI, cervical spine (07/23/2023)    NAS-11 score:  Pre-procedure: 9 /10   Post-procedure: 9 /10     Effectiveness:  Initial hour after procedure:   ***. Subsequent 4-6 hours post-procedure:   ***. Analgesia past initial 6 hours:   ***. Ongoing improvement:  Analgesic:  *** Function:    ***    ROM:    ***       Pharmacotherapy Assessment   Analgesic: No chronic opioid analgesics therapy prescribed by our practice. None. MME/day: 0 mg/day   Monitoring: [place] PMP: PDMP reviewed during this encounter.       Pharmacotherapy: No side-effects or adverse reactions reported. Compliance: No problems identified. Effectiveness: Clinically acceptable.  No notes on file  No results found for: "CBDTHCR" No results found for: "D8THCCBX" No results found for: "D9THCCBX"  UDS:  Summary  Date Value Ref Range Status  10/08/2023 FINAL  Final    Comment:    ==================================================================== Compliance Drug Analysis, Ur ==================================================================== Test                             Result       Flag       Units  Drug Present and Declared for Prescription Verification   7-aminoclonazepam              161          EXPECTED   ng/mg creat    7-aminoclonazepam is an expected metabolite of clonazepam. Source of    clonazepam is a scheduled prescription medication.    Gabapentin                     PRESENT      EXPECTED  Drug Absent but Declared for Prescription Verification   Amitriptyline                   Not Detected UNEXPECTED   Nortriptyline                  Not Detected UNEXPECTED   Promethazine                    Not Detected UNEXPECTED ==================================================================== Test                      Result    Flag   Units      Ref Range   Creatinine              49               mg/dL      >=96 ==================================================================== Declared Medications:  The flagging and interpretation on this report are based on the  following declared medications.  Unexpected results may arise from  inaccuracies in the declared medications.   **Note: The testing scope of this panel includes these medications:   Amitriptyline  (Elavil )  Clonazepam (Klonopin)  Gabapentin   Nortriptyline (Pamelor)  Promethazine  (Phenergan )   **Note: The testing scope of this panel does not include the  following reported medications:   Atorvastatin  (Lipitor)  Empagliflozin  (Jardiance )  Indomethacin (Indocin)  Meloxicam (Mobic)  Metformin  Ondansetron  (Zofran )  Pantoprazole  (Protonix )  Pioglitazone  (Actos )  Rimegepant (Nurtec)  Sildenafil (Viagra)  Zavegepant (Zavzpret ) ==================================================================== For clinical consultation, please call [phone]. ====================================================================      ROS  Constitutional: Denies any fever or chills Gastrointestinal: No reported hemesis, hematochezia, vomiting, or acute GI distress Musculoskeletal: Denies any acute onset joint swelling, redness, loss of ROM, or  weakness Neurological: No reported episodes of acute onset apraxia, aphasia, dysarthria, agnosia, amnesia, paralysis, loss of coordination, or loss of consciousness  Medication Review  Rimegepant Sulfate, Zavegepant HCl, amitriptyline , atorvastatin , clonazePAM, empagliflozin , gabapentin, indomethacin, meloxicam, metFORMIN, nortriptyline, nystatin , ondansetron , pantoprazole , pioglitazone , sertraline, and sildenafil  History Review  Allergy: Micheal Gay is allergic to other and pollen extract. Drug: Micheal Gay  reports no history of drug use. Alcohol:  reports no history of alcohol use. Tobacco:  reports that he has never smoked. He quit smokeless tobacco use about 41 years ago.  His smokeless tobacco use included chew. Social: Micheal Gay  reports that he has never smoked. He quit smokeless tobacco use about 41 years ago.  His smokeless tobacco use included chew. He reports that he does not drink alcohol and does not use drugs. Medical:  has a past medical history of Bipolar 1 disorder (HCC), Depression, Diabetes mellitus without complication (HCC), Keratoconus of right eye, Sepsis (HCC)  (09/22/2016), and Septicemia (HCC) (09/22/2016). Surgical: Micheal Gay  has a past surgical history that includes Eye surgery. Family: family history includes Breast cancer in his mother; Diabetes in his mother; Heart attack in his father.  Laboratory Chemistry Profile   Renal Lab Results  Component Value Date   BUN 30 (H) 10/08/2023   CREATININE 1.02 10/08/2023   BCR 29 (H) 10/08/2023   GFRAA >60 01/18/2017   GFRNONAA >60 12/21/2022    Hepatic Lab Results  Component Value Date   AST 74 (H) 10/08/2023   ALT 82 (H) 07/03/2023   ALBUMIN 4.8 10/08/2023   ALKPHOS 76 10/08/2023    Electrolytes Lab Results  Component Value Date   NA 141 10/08/2023   K 4.6 10/08/2023   CL 99 10/08/2023   CALCIUM  9.3 10/08/2023   MG 2.0 10/08/2023    Bone Lab Results  Component Value Date   25OHVITD1 27 (L) 10/08/2023   25OHVITD2 <1.0 10/08/2023   25OHVITD3 26 10/08/2023    Inflammation (CRP: Acute Phase) (ESR: Chronic Phase) Lab Results  Component Value Date   CRP <1 10/08/2023   ESRSEDRATE 23 10/08/2023   LATICACIDVEN 1.5 09/22/2016         Note: Above Lab results reviewed.  Recent Imaging Review  DG ESOPHAGUS W DOUBLE CM (HD) CLINICAL DATA:  Patient with history of choking sensation when eating "dry foods" such as chicken and steak, feeling like he has to force food down sometimes by punching his chest, and difficulty chewing due to lack of lower teeth. Previous EGD 07/17/23 noted mild distal esophageal stricture which was dilated, however he reports no improvement in symptoms post dilation. Request for esophagram for further evaluation.  EXAM: ESOPHAGUS/BARIUM SWALLOW/TABLET STUDY  TECHNIQUE: Combined double and single contrast examination was performed using effervescent crystals, high-density barium, and thin liquid barium. This exam was performed by Nathan Bake, PA-C, and was supervised and interpreted by Ula Gambler, MD.  FLUOROSCOPY: Radiation Exposure Index (as  provided by the fluoroscopic device): 27.30 mGy Kerma  COMPARISON:  Chest radiograph dated 12/21/2022  FINDINGS: Swallowing: Appears normal. No vestibular penetration or aspiration seen.  Pharynx: Unremarkable.  Esophagus: Normal appearance.  Esophageal motility: Within normal limits.  Hiatal Hernia: Very small sliding type hiatal hernia best seen in the prone position  Gastroesophageal reflux: None visualized.  Ingested 13 mm barium tablet: Passed normally  Other: None.  IMPRESSION: 1. Small hiatal hernia. 2. No significant stricture or esophageal dysmotility. No obstruction to the passage of a 13 mm tablet.  Electronically Signed  By: Limin  Xu M.D.   On: 12/19/2023 12:32 Note: Reviewed         Physical Exam  General appearance: Well nourished, well developed, and well hydrated. In no apparent acute distress Mental status: Alert, oriented x 3 (person, place, & time)       Respiratory: No evidence of acute respiratory distress Eyes: PERLA Vitals: There were no vitals taken for this visit. BMI: Estimated body mass index is 30.81 kg/m as calculated from the following:   Height as of 11/18/23: 5' 11.5" (1.816 m).   Weight as of 11/18/23: 224 lb (101.6 kg). Ideal: Patient weight not recorded  Assessment   Diagnosis Status  1. Chronic neck pain (1ry area of Pain) (Midline) (Bilateral)   2. Cervicogenic headache (2ry area of Pain)   3. Chronic shoulder pain (3ry area of Pain) (Bilateral)   4. Postop check    Controlled Controlled Controlled   Updated Problems: No problems updated.  Plan of Care  Problem-specific:  Assessment and Plan            Micheal Gay has a current medication list which includes the following long-term medication(s): amitriptyline , atorvastatin , clonazepam, gabapentin, metformin, nortriptyline, pioglitazone , sertraline, and sildenafil.  Pharmacotherapy (Medications Ordered): No orders of the defined types were placed in  this encounter.  Orders:  No orders of the defined types were placed in this encounter.    Interventional Therapies  Risk Factors  Considerations  Medical Comorbidities:  BPH  HTN  GERD  Hx. BNZ use  T2NIDDM  Bipolar     Planned  Pending:      Under consideration:       Completed:   Diagnostic/therapeutic midline cervical ESI x1 (11/18/2023) [facility])  Referral to physical therapy entered (10/29/2023)    Therapeutic  Palliative (PRN) options:   None established   Completed by other providers:   None reported      No follow-ups on file.    Recent Visits Date Type Provider Dept  12/09/23 Office Visit Renaldo Caroli, MD [facility]  11/18/23 Procedure visit Renaldo Caroli, MD [facility]  Showing recent visits within past 90 days and meeting all other requirements Future Appointments Date Type Provider Dept  02/04/24 Appointment Renaldo Caroli, MD [facility]  Showing future appointments within next 90 days and meeting all other requirements  I discussed the assessment and treatment plan with the patient. The patient was provided an opportunity to ask questions and all were answered. The patient agreed with the plan and demonstrated an understanding of the instructions.  Patient advised to call back or seek an in-person evaluation if the symptoms or condition worsens.  Duration of encounter: *** minutes.  Total time on encounter, as per AMA guidelines included both the face-to-face and non-face-to-face time personally spent by the physician and/or other qualified health care professional(s) on the day of the encounter (includes time in activities that require the physician or other qualified health care professional and does not include time in activities normally performed by clinical staff). Physician's time may include the following activities when performed: Preparing to see the patient (e.g., pre-charting review of  records, searching for previously ordered imaging, lab work, and nerve conduction tests) Review of prior analgesic pharmacotherapies. Reviewing PMP Interpreting ordered tests (e.g., lab work, imaging, nerve conduction tests) Performing post-procedure evaluations, including interpretation of diagnostic procedures Obtaining and/or reviewing separately obtained history Performing a medically appropriate examination and/or evaluation Counseling and educating the patient/family/caregiver Ordering medications,  tests, or procedures Referring and communicating with other health care professionals (when not separately reported) Documenting clinical information in the electronic or other health record Independently interpreting results (not separately reported) and communicating results to the patient/ family/caregiver Care coordination (not separately reported)  Note by: Candi Chafe, MD (TTS and AI technology used. I apologize for any typographical errors that were not detected and corrected.) Date: 02/04/2024; Time: 3:52 PM

## 2024-02-04 ENCOUNTER — Ambulatory Visit (HOSPITAL_BASED_OUTPATIENT_CLINIC_OR_DEPARTMENT_OTHER): Admitting: Pain Medicine

## 2024-02-04 DIAGNOSIS — G4486 Cervicogenic headache: Secondary | ICD-10-CM

## 2024-02-04 DIAGNOSIS — Z09 Encounter for follow-up examination after completed treatment for conditions other than malignant neoplasm: Secondary | ICD-10-CM

## 2024-02-04 DIAGNOSIS — Z91199 Patient's noncompliance with other medical treatment and regimen due to unspecified reason: Secondary | ICD-10-CM

## 2024-02-04 DIAGNOSIS — G8929 Other chronic pain: Secondary | ICD-10-CM

## 2024-02-09 ENCOUNTER — Other Ambulatory Visit: Payer: Self-pay | Admitting: Internal Medicine

## 2024-02-09 DIAGNOSIS — E119 Type 2 diabetes mellitus without complications: Secondary | ICD-10-CM

## 2024-02-13 ENCOUNTER — Other Ambulatory Visit: Payer: Self-pay | Admitting: Internal Medicine

## 2024-02-13 ENCOUNTER — Telehealth: Payer: Self-pay

## 2024-02-13 NOTE — Telephone Encounter (Signed)
 Patient called stating that he's wetting the bed at night, he's been having these issues for a while now  he wants to know if there is something that he can take to help with that or does he need to make an appt?

## 2024-02-20 ENCOUNTER — Ambulatory Visit (INDEPENDENT_AMBULATORY_CARE_PROVIDER_SITE_OTHER): Admitting: Internal Medicine

## 2024-02-20 ENCOUNTER — Ambulatory Visit: Payer: Self-pay | Admitting: Internal Medicine

## 2024-02-20 VITALS — BP 124/68 | HR 74 | Temp 96.7°F | Ht 71.0 in | Wt 219.0 lb

## 2024-02-20 DIAGNOSIS — E1129 Type 2 diabetes mellitus with other diabetic kidney complication: Secondary | ICD-10-CM | POA: Diagnosis not present

## 2024-02-20 DIAGNOSIS — F32A Depression, unspecified: Secondary | ICD-10-CM

## 2024-02-20 DIAGNOSIS — F419 Anxiety disorder, unspecified: Secondary | ICD-10-CM

## 2024-02-20 DIAGNOSIS — K760 Fatty (change of) liver, not elsewhere classified: Secondary | ICD-10-CM | POA: Insufficient documentation

## 2024-02-20 DIAGNOSIS — N401 Enlarged prostate with lower urinary tract symptoms: Secondary | ICD-10-CM

## 2024-02-20 DIAGNOSIS — E119 Type 2 diabetes mellitus without complications: Secondary | ICD-10-CM

## 2024-02-20 DIAGNOSIS — R351 Nocturia: Secondary | ICD-10-CM | POA: Diagnosis not present

## 2024-02-20 DIAGNOSIS — Z0130 Encounter for examination of blood pressure without abnormal findings: Secondary | ICD-10-CM

## 2024-02-20 DIAGNOSIS — Z0001 Encounter for general adult medical examination with abnormal findings: Secondary | ICD-10-CM | POA: Diagnosis not present

## 2024-02-20 DIAGNOSIS — E782 Mixed hyperlipidemia: Secondary | ICD-10-CM

## 2024-02-20 DIAGNOSIS — R809 Proteinuria, unspecified: Secondary | ICD-10-CM

## 2024-02-20 LAB — POC CREATINE & ALBUMIN,URINE
Creatinine, POC: 50 mg/dL
Microalbumin Ur, POC: 30 mg/L

## 2024-02-20 LAB — POCT URINALYSIS DIPSTICK
Bilirubin, UA: NEGATIVE
Blood, UA: NEGATIVE
Glucose, UA: NEGATIVE
Ketones, UA: NEGATIVE
Leukocytes, UA: NEGATIVE
Nitrite, UA: NEGATIVE
Protein, UA: NEGATIVE
Spec Grav, UA: 1.015 (ref 1.010–1.025)
Urobilinogen, UA: 0.2 U/dL
pH, UA: 6 (ref 5.0–8.0)

## 2024-02-20 LAB — POCT CBG (FASTING - GLUCOSE)-MANUAL ENTRY: Glucose Fasting, POC: 109 mg/dL — AB (ref 70–99)

## 2024-02-20 MED ORDER — KERENDIA 10 MG PO TABS: 10.0000 mg | ORAL_TABLET | Freq: Every day | ORAL | 1 refills | Status: AC

## 2024-02-20 MED ORDER — ESCITALOPRAM OXALATE 5 MG PO TABS: 5.0000 mg | ORAL_TABLET | Freq: Every day | ORAL | 1 refills | Status: DC

## 2024-02-20 MED ORDER — TAMSULOSIN HCL 0.4 MG PO CAPS
0.4000 mg | ORAL_CAPSULE | Freq: Every day | ORAL | 0 refills | Status: DC
Start: 2024-02-20 — End: 2024-05-17

## 2024-02-20 MED ORDER — EMPAGLIFLOZIN 25 MG PO TABS
25.0000 mg | ORAL_TABLET | Freq: Every day | ORAL | 2 refills | Status: AC
Start: 2024-02-20 — End: 2024-05-20

## 2024-02-20 NOTE — Progress Notes (Signed)
 Established Patient Office Visit  Subjective:  Patient ID: Micheal Gay, male    DOB: 09-Oct-1965  Age: 58 y.o. MRN: [id number]  Chief Complaint  Patient presents with   Follow-up    3 months follow up & urinary incontinence    No new complaints except increased urinary frequency, nocturia with enuresis. Also here for CPE, lab review and medication refills.Labs reviewed and notable for well controlled lipids, A1c at target with normal psa, cbc and cmp notable for elevated transaminases.     No other concerns at this time.   Past Medical History:  Diagnosis Date   Bipolar 1 disorder (HCC)    Depression    Diabetes mellitus without complication (HCC)    Keratoconus of right eye    Sepsis (HCC) 09/22/2016   Septicemia (HCC) 09/22/2016    Past Surgical History:  Procedure Laterality Date   EYE SURGERY      Social History   Socioeconomic History   Marital status: Divorced    Spouse name: Not on file   Number of children: Not on file   Years of education: Not on file   Highest education level: Not on file  Occupational History   Not on file  Tobacco Use   Smoking status: Never   Smokeless tobacco: Former    Types: Chew    Quit date: 09/09/1982  Substance and Sexual Activity   Alcohol use: No   Drug use: No   Sexual activity: Not on file  Other Topics Concern   Not on file  Social History Narrative   Not on file   Social Drivers of Health   Financial Resource Strain: Not on file  Food Insecurity: Not on file  Transportation Needs: Not on file  Physical Activity: Not on file  Stress: Not on file  Social Connections: Not on file  Intimate Partner Violence: Not on file    Family History  Problem Relation Age of Onset   Diabetes Mother    Breast cancer Mother    Heart attack Father    Prostate cancer Neg Hx    Kidney cancer Neg Hx    Bladder Cancer Neg Hx     Allergies  Allergen Reactions   Other     Animal Dandruff Hay     Pollen Extract      Outpatient Medications Prior to Visit  Medication Sig   amitriptyline  (ELAVIL ) 10 MG tablet TAKE 1 TABLET BY MOUTH AT BEDTIME   atorvastatin  (LIPITOR) 20 MG tablet TAKE 1 TABLET BY MOUTH AT BEDTIME. APPOINTMENT NEEDED FOR REFILLS.   clonazePAM (KLONOPIN) 0.5 MG tablet Take 0.5 mg by mouth 3 (three) times daily as needed.   gabapentin (NEURONTIN) 400 MG capsule Take 400 mg by mouth 2 (two) times daily.   indomethacin (INDOCIN) 50 MG capsule TAKE 1 CAPSULE BY MOUTH THREE TIMES DAILY AS NEEDED FOR HEADACHE   meloxicam (MOBIC) 15 MG tablet Take 15 mg by mouth daily.   metFORMIN (GLUCOPHAGE) 1000 MG tablet Take 1 tablet by mouth twice daily   nortriptyline (PAMELOR) 10 MG capsule Take by mouth.   nystatin  (MYCOSTATIN ) 100000 UNIT/ML suspension Take 5 mLs (500,000 Units total) by mouth 4 (four) times daily.   ondansetron  (ZOFRAN -ODT) 4 MG disintegrating tablet DISSOLVE 1 TABLET IN MOUTH EVERY 8 HOURS AS NEEDED FOR NAUSEA FOR VOMITING   pantoprazole  (PROTONIX ) 40 MG tablet Take 40 mg by mouth daily.   pioglitazone  (ACTOS ) 45 MG tablet Take 1 tablet (45 mg total) by  mouth every morning.   Rimegepant Sulfate (NURTEC) 75 MG TBDP Take 1 tablet (75 mg total) by mouth daily as needed (headache).   sildenafil (VIAGRA) 100 MG tablet TAKE ONE-HALF TO ONE TABLET BY MOUTH 30 MINUTES TO 4 HOURS BEFORE INTERCOURSE AS DIRECTED   Zavegepant HCl (ZAVZPRET ) 10 MG/ACT SOLN Place 1 spray into the nose daily.   [DISCONTINUED] JARDIANCE  25 MG TABS tablet TAKE 1 TABLET BY MOUTH ONCE DAILY BEFORE BREAKFAST   [DISCONTINUED] sertraline (ZOLOFT) 25 MG tablet Take 25 mg by mouth daily.   No facility-administered medications prior to visit.    Review of Systems  Constitutional: Negative.  Negative for weight loss.  HENT: Negative.    Eyes: Negative.   Respiratory: Negative.    Cardiovascular: Negative.   Gastrointestinal: Negative.   Genitourinary: Negative.   Skin: Negative.   Neurological: Negative.    Endo/Heme/Allergies: Negative.        Objective:   BP 124/68   Pulse 74   Temp (!) 96.7 F (35.9 C) (Tympanic)   Ht 5' 11 (1.803 m)   Wt 219 lb (99.3 kg)   SpO2 97%   BMI 30.54 kg/m   Vitals:   02/20/24 1121  BP: 124/68  Pulse: 74  Temp: (!) 96.7 F (35.9 C)  Height: 5' 11 (1.803 m)  Weight: 219 lb (99.3 kg)  SpO2: 97%  TempSrc: Tympanic  BMI (Calculated): 30.56    Physical Exam Vitals reviewed.  Constitutional:      Appearance: Normal appearance.  HENT:     Head: Normocephalic.     Left Ear: There is no impacted cerumen.     Nose: Nose normal.     Mouth/Throat:     Mouth: Mucous membranes are moist.     Tongue: Lesions (papillae/bullae on the left front tongue and the back) present.     Pharynx: No posterior oropharyngeal erythema.   Eyes:     Extraocular Movements: Extraocular movements intact.     Pupils: Pupils are equal, round, and reactive to light.    Cardiovascular:     Rate and Rhythm: Regular rhythm.     Chest Wall: PMI is not displaced.     Pulses: Normal pulses.     Heart sounds: Normal heart sounds. No murmur heard. Pulmonary:     Effort: Pulmonary effort is normal.     Breath sounds: Normal air entry. No rhonchi or rales.  Abdominal:     General: Abdomen is flat. Bowel sounds are normal. There is no distension.     Palpations: Abdomen is soft. There is no hepatomegaly, splenomegaly or mass.     Tenderness: There is no abdominal tenderness.   Musculoskeletal:        General: Normal range of motion.     Cervical back: Normal range of motion and neck supple.     Right lower leg: No edema.     Left lower leg: No edema.   Skin:    General: Skin is warm and dry.   Neurological:     General: No focal deficit present.     Mental Status: He is alert and oriented to person, place, and time.     Cranial Nerves: No cranial nerve deficit.     Motor: No weakness.   Psychiatric:        Mood and Affect: Mood normal.        Behavior:  Behavior normal.      Results for orders placed or performed in visit on  02/20/24  POCT CBG (Fasting - Glucose)  Result Value Ref Range   Glucose Fasting, POC 109 (A) 70 - 99 mg/dL    Recent Results (from the past 2160 hours)  POCT CBG (Fasting - Glucose)     Status: Abnormal   Collection Time: 02/20/24 11:26 AM  Result Value Ref Range   Glucose Fasting, POC 109 (A) 70 - 99 mg/dL      Assessment & Plan:  As per problem list. Replace Zoloft with lexapro .  Problem List Items Addressed This Visit       Digestive   Fatty liver     Endocrine   Type 2 diabetes mellitus without complication, without long-term current use of insulin (HCC) - Primary   Relevant Medications   empagliflozin  (JARDIANCE ) 25 MG TABS tablet   Other Relevant Orders   POCT CBG (Fasting - Glucose) (Completed)     Other   Hyperlipidemia   Benign prostatic hyperplasia with nocturia   Relevant Medications   tamsulosin  (FLOMAX ) 0.4 MG CAPS capsule   Other Relevant Orders   POCT Urinalysis Dipstick (16109)   Other Visit Diagnoses       Controlled type 2 diabetes mellitus without complication, without long-term current use of insulin (HCC)       Relevant Medications   empagliflozin  (JARDIANCE ) 25 MG TABS tablet     Anxiety and depression       Relevant Medications   escitalopram  (LEXAPRO ) 5 MG tablet       Return in about 6 weeks (around 04/02/2024) for mood f/u.   Total time spent: 30 minutes  Arzella Bitters, MD  02/20/2024   This document may have been prepared by [facility] Voice Recognition software and as such may include unintentional dictation errors.

## 2024-02-23 ENCOUNTER — Ambulatory Visit: Payer: Self-pay | Admitting: Internal Medicine

## 2024-02-24 ENCOUNTER — Encounter: Payer: Self-pay | Admitting: Internal Medicine

## 2024-03-04 NOTE — Progress Notes (Unsigned)
 PROVIDER NOTE: Interpretation of information contained herein should be left to medically-trained personnel. Specific patient instructions are provided elsewhere under Patient Instructions section of medical record. This document was created in part using AI and STT-dictation technology, any transcriptional errors that may result from this process are unintentional.  Patient: Micheal Gay  Service: E/M   PCP: Micheal GORMAN Dine, MD  DOB: August 21, 1966  DOS: 03/08/2024  Provider: [name], MD  MRN: [id number]  Delivery: Face-to-face  Specialty: Interventional Pain Management  Type: Established Patient  Setting: Ambulatory outpatient facility  Specialty designation: 09  Referring Prov.: Micheal GORMAN Dine, MD  Location: Outpatient office facility       History of present illness (HPI) Micheal Gay, a 58 y.o. year old male, is here today because of his Cervicalgia [M54.2]. Micheal Gay primary complain today is No chief complaint on file.  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  Pain Assessment: Severity of   is reported as a  /10. Location:    / . Onset:  . Quality:  . Timing:  . Modifying factor(s):  SABRA Vitals:  vitals were not taken for this  visit.  BMI: Estimated body mass index is 30.54 kg/m as calculated from the following:   Height as of 02/20/24: 5' 11 (1.803 m).   Weight as of 02/20/24: 219 lb (99.3 kg).  Last encounter: 02/04/2024. Last procedure: 11/18/2023.  Reason for encounter: evaluation of worsening, or previously known (established) problem & PPE.  On 11/18/2023 the patient had a midline cervical epidural steroid injection under fluoroscopic guidance and IV sedation.  He was scheduled to return for evaluation of the procedure on 401 25 but he did not keep that virtual visit appointment and we reschedule him to come back on 02/04/2024 and he also did not keep that appointment.  By now the information from the diagnostic portion of the cervical epidural steroid injection is likely to be degraded secondary to the fact that it is usually kept relatively fresh for the first 14 days after the procedure after which it then begins to be compromised.  Discussed the use of AI scribe software for clinical note transcription with the patient, who gave verbal consent to proceed.  History of Present Illness         Post-Procedure Evaluation   Procedure: Cervical Epidural Steroid injection (CESI) (Interlaminar) #1  Laterality: Midline  Level: C7-T1 Imaging: Fluoroscopy-assisted DOS: 11/18/2023  Performed by: Eric Como, MD Anesthesia: Local anesthesia (1-2% Lidocaine ) Anxiolysis: None                 Sedation: No Sedation                         Purpose: Diagnostic/Therapeutic Indications: Cervicalgia, cervical radicular pain, degenerative disc disease, severe enough to impact quality of life or function. 1. Chronic neck pain (1ry area of Pain) (Midline) (Bilateral)  2. Cervicogenic headache (2ry area of Pain)   3. Cervicalgia   4. Cervical Grade 1 Retrolisthesis of C3/C4 (Stable)   5. 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 Cervical facet  hypertrophy (Multilevel) (Bilateral)   8. Chronic shoulder pain (3ry area of Pain) (Bilateral)   9. DDD (degenerative disc disease), cervical   10. Decreased range of motion of intervertebral discs of cervical spine   11. Painful cervical range of motion   12. Abnormal MRI, cervical spine (07/23/2023)    NAS-11 score:   Pre-procedure: 9 /10   Post-procedure: 9 /10     Effectiveness:  Initial hour after procedure:   ***. Subsequent 4-6 hours post-procedure:   ***. Analgesia past initial 6 hours:   ***. Ongoing improvement:  Analgesic:  *** Function:    ***    ROM:    ***      Pharmacotherapy Assessment   Analgesic: No chronic opioid analgesics therapy prescribed by our practice. None. MME/day: 0 mg/day   Monitoring: [place] PMP: PDMP reviewed during this encounter.       Pharmacotherapy: No side-effects or adverse reactions reported. Compliance: No problems identified. Effectiveness: Clinically acceptable.  No notes on file  UDS:  Summary  Date Value Ref Range Status  10/08/2023 FINAL  Final    Comment:    ==================================================================== Compliance Drug Analysis, Ur ==================================================================== Test                             Result       Flag       Units  Drug Present and Declared for Prescription Verification   7-aminoclonazepam              161          EXPECTED   ng/mg creat    7-aminoclonazepam is an expected metabolite of clonazepam. Source of    clonazepam is a scheduled prescription medication.    Gabapentin                     PRESENT      EXPECTED  Drug Absent but Declared for Prescription Verification   Amitriptyline                   Not Detected UNEXPECTED   Nortriptyline                  Not Detected UNEXPECTED   Promethazine                    Not Detected UNEXPECTED ==================================================================== Test                      Result    Flag    Units      Ref Range   Creatinine              49               mg/dL      >=79 ==================================================================== Declared Medications:  The flagging and interpretation on this report are based on the  following declared medications.  Unexpected results may arise from  inaccuracies in the declared medications.   **Note: The testing scope of this panel includes these medications:   Amitriptyline  (Elavil )  Clonazepam (Klonopin)  Gabapentin  Nortriptyline (Pamelor)  Promethazine  (Phenergan )   **Note: The testing scope of this  panel does not include the  following reported medications:   Atorvastatin  (Lipitor)  Empagliflozin  (Jardiance )  Indomethacin (Indocin)  Meloxicam (Mobic)  Metformin  Ondansetron  (Zofran )  Pantoprazole  (Protonix )  Pioglitazone  (Actos )  Rimegepant (Nurtec)  Sildenafil (Viagra)  Zavegepant (Zavzpret ) ==================================================================== For clinical consultation, please call [phone]. ====================================================================     No results found for: CBDTHCR No results found for: D8THCCBX No results found for: D9THCCBX  ROS  Constitutional: Denies any fever or chills Gastrointestinal: No reported hemesis, hematochezia, vomiting, or acute GI distress Musculoskeletal: Denies any acute onset joint swelling, redness, loss of ROM, or weakness Neurological: No reported episodes of acute onset apraxia, aphasia, dysarthria, agnosia, amnesia, paralysis, loss of coordination, or loss of consciousness  Medication Review  Finerenone , Rimegepant Sulfate, Zavegepant HCl, amitriptyline , atorvastatin , clonazePAM, empagliflozin , escitalopram , gabapentin, indomethacin, meloxicam, metFORMIN, nortriptyline, nystatin , ondansetron , pantoprazole , pioglitazone , sildenafil, and tamsulosin   History Review  Allergy: Mr. Gracy is allergic to other and pollen  extract. Drug: Mr. Wieczorek  reports no history of drug use. Alcohol:  reports no history of alcohol use. Tobacco:  reports that he has never smoked. He quit smokeless tobacco use about 41 years ago.  His smokeless tobacco use included chew. Social: Mr. Delis  reports that he has never smoked. He quit smokeless tobacco use about 41 years ago.  His smokeless tobacco use included chew. He reports that he does not drink alcohol and does not use drugs. Medical:  has a past medical history of Bipolar 1 disorder (HCC), Depression, Diabetes mellitus without complication (HCC), Keratoconus of right eye, Sepsis (HCC) (09/22/2016), and Septicemia (HCC) (09/22/2016). Surgical: Mr. Lad  has a past surgical history that includes Eye surgery. Family: family history includes Breast cancer in his mother; Diabetes in his mother; Heart attack in his father.  Laboratory Chemistry Profile   Renal Lab Results  Component Value Date   BUN 31 (H) 02/19/2024   CREATININE 1.18 02/19/2024   BCR 26 (H) 02/19/2024   GFRAA >60 01/18/2017   GFRNONAA >60 12/21/2022    Hepatic Lab Results  Component Value Date   AST 65 (H) 02/19/2024   ALT 48 (H) 02/19/2024   ALBUMIN 4.5 02/19/2024   ALKPHOS 69 02/19/2024    Electrolytes Lab Results  Component Value Date   NA 139 02/19/2024   K 4.2 02/19/2024   CL 99 02/19/2024   CALCIUM  9.2 02/19/2024   MG 2.0 10/08/2023    Bone Lab Results  Component Value Date   25OHVITD1 27 (L) 10/08/2023   25OHVITD2 <1.0 10/08/2023   25OHVITD3 26 10/08/2023    Inflammation (CRP: Acute Phase) (ESR: Chronic Phase) Lab Results  Component Value Date   CRP <1 10/08/2023   ESRSEDRATE 23 10/08/2023   LATICACIDVEN 1.5 09/22/2016         Note: Above Lab results reviewed.  Recent Imaging Review  DG ESOPHAGUS W DOUBLE CM (HD) CLINICAL DATA:  Patient with history of choking sensation when eating dry foods such as chicken and steak, feeling like he has to force food down  sometimes by punching his chest, and difficulty chewing due to lack of lower teeth. Previous EGD 07/17/23 noted mild distal esophageal stricture which was dilated, however he reports no improvement in symptoms post dilation. Request for esophagram for further evaluation.  EXAM: ESOPHAGUS/BARIUM SWALLOW/TABLET STUDY  TECHNIQUE: Combined double and single contrast examination was performed using effervescent crystals, high-density barium, and thin liquid barium. This exam was performed by Clotilda Hesselbach, PA-C, and was supervised and interpreted by Manford Cummins, MD.  FLUOROSCOPY: Radiation Exposure Index (as provided by the fluoroscopic device): 27.30 mGy Kerma  COMPARISON:  Chest radiograph dated 12/21/2022  FINDINGS: Swallowing: Appears normal. No vestibular penetration or aspiration seen.  Pharynx: Unremarkable.  Esophagus: Normal appearance.  Esophageal motility: Within normal limits.  Hiatal Hernia: Very small sliding type hiatal hernia best seen in the prone position  Gastroesophageal reflux: None visualized.  Ingested 13 mm barium tablet: Passed normally  Other: None.  IMPRESSION: 1. Small hiatal hernia. 2. No significant stricture or esophageal dysmotility. No obstruction to the passage of a 13 mm tablet.  Electronically Signed   By: Limin  Xu M.D.   On: 12/19/2023 12:32 Note: Reviewed        Physical Exam  General appearance: Well nourished, well developed, and well hydrated. In no apparent acute distress Mental status: Alert, oriented x 3 (person, place, & time)       Respiratory: No evidence of acute respiratory distress Eyes: PERLA Vitals: There were no vitals taken for this visit. BMI: Estimated body mass index is 30.54 kg/m as calculated from the following:   Height as of 02/20/24: 5' 11 (1.803 m).   Weight as of 02/20/24: 219 lb (99.3 kg). Ideal: Patient weight not recorded  Assessment   Diagnosis Status  1. Cervicalgia   2. Cervicogenic  headache (2ry area of Pain)   3. Cervical facet joint pain   4. Cervical Grade 1 Retrolisthesis of C3/C4 (Stable)   5. 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 Abnormal MRI, cervical spine (07/23/2023)   8. Chronic shoulder pain (3ry area of Pain) (Bilateral)   9. Postop check    Controlled Controlled Controlled   Updated Problems: No problems updated.  Plan of Care  Problem-specific:  Assessment and Plan            Mr. JIOVANI MCCAMMON has a current medication list which includes the following long-term medication(s): amitriptyline , atorvastatin , clonazepam, escitalopram , gabapentin, metformin, nortriptyline, pioglitazone , and sildenafil.  Pharmacotherapy (Medications Ordered): No orders of the defined types were placed in this encounter.  Orders:  No orders of the defined types were placed in this encounter.    Interventional Therapies  Risk Factors  Considerations  Medical Comorbidities:  BPH  HTN  GERD  Hx. BNZ use  T2NIDDM  Bipolar     Planned  Pending:   Diagnostic right cervical ESI #2  Referral to physical therapy entered (10/29/2023)    Under consideration:   Diagnostic right cervical ESI #1 w/ possible series of 3.  Diagnostic bilateral cervical facet MBB #1 w/ possible follow-up RFA.    Completed:   Diagnostic midline cervical ESI x1 (11/18/2023)    Therapeutic  Palliative (PRN) options:   None established   Completed by other providers:   None reported      No follow-ups on file.    Recent Visits Date Type Provider Dept  02/04/24 Office Visit Tanya Glisson, MD [facility]  12/09/23 Office Visit Tanya Glisson, MD [facility]  Showing recent visits within past 90 days and meeting all other requirements Future Appointments Date Type Provider Dept  03/08/24 Appointment Tanya Glisson, MD [facility]  Showing future  appointments within next 90 days and meeting all other requirements  I discussed the assessment and treatment plan with the patient. The patient was provided an opportunity to ask questions and all were answered. The patient agreed with the plan and demonstrated an  understanding of the instructions.  Patient advised to call back or seek an in-person evaluation if the symptoms or condition worsens.  Duration of encounter: *** minutes.  Total time on encounter, as per AMA guidelines included both the face-to-face and non-face-to-face time personally spent by the physician and/or other qualified health care professional(s) on the day of the encounter (includes time in activities that require the physician or other qualified health care professional and does not include time in activities normally performed by clinical staff). Physician's time may include the following activities when performed: Preparing to see the patient (e.g., pre-charting review of records, searching for previously ordered imaging, lab work, and nerve conduction tests) Review of prior analgesic pharmacotherapies. Reviewing PMP Interpreting ordered tests (e.g., lab work, imaging, nerve conduction tests) Performing post-procedure evaluations, including interpretation of diagnostic procedures Obtaining and/or reviewing separately obtained history Performing a medically appropriate examination and/or evaluation Counseling and educating the patient/family/caregiver Ordering medications, tests, or procedures Referring and communicating with other health care professionals (when not separately reported) Documenting clinical information in the electronic or other health record Independently interpreting results (not separately reported) and communicating results to the patient/ family/caregiver Care coordination (not separately reported)  Note by: [name], MD (TTS and AI technology used. I apologize for any typographical  errors that were not detected and corrected.) Date: 03/08/2024; Time: 4:49 PM

## 2024-03-08 ENCOUNTER — Ambulatory Visit: Attending: Pain Medicine | Admitting: Pain Medicine

## 2024-03-08 ENCOUNTER — Encounter: Payer: Self-pay | Admitting: Pain Medicine

## 2024-03-08 VITALS — BP 115/78 | HR 81 | Temp 97.2°F | Ht 71.0 in | Wt 219.0 lb

## 2024-03-08 DIAGNOSIS — M542 Cervicalgia: Secondary | ICD-10-CM | POA: Insufficient documentation

## 2024-03-08 DIAGNOSIS — Z09 Encounter for follow-up examination after completed treatment for conditions other than malignant neoplasm: Secondary | ICD-10-CM | POA: Insufficient documentation

## 2024-03-08 DIAGNOSIS — K148 Other diseases of tongue: Secondary | ICD-10-CM | POA: Insufficient documentation

## 2024-03-08 DIAGNOSIS — R937 Abnormal findings on diagnostic imaging of other parts of musculoskeletal system: Secondary | ICD-10-CM | POA: Insufficient documentation

## 2024-03-08 DIAGNOSIS — G4486 Cervicogenic headache: Secondary | ICD-10-CM | POA: Diagnosis present

## 2024-03-08 DIAGNOSIS — M4802 Spinal stenosis, cervical region: Secondary | ICD-10-CM | POA: Diagnosis present

## 2024-03-08 DIAGNOSIS — M25512 Pain in left shoulder: Secondary | ICD-10-CM | POA: Insufficient documentation

## 2024-03-08 DIAGNOSIS — G8929 Other chronic pain: Secondary | ICD-10-CM | POA: Diagnosis present

## 2024-03-08 DIAGNOSIS — M25511 Pain in right shoulder: Secondary | ICD-10-CM | POA: Diagnosis present

## 2024-03-08 DIAGNOSIS — M4310 Spondylolisthesis, site unspecified: Secondary | ICD-10-CM | POA: Diagnosis present

## 2024-03-08 NOTE — Patient Instructions (Addendum)
 ______________________________________________________________________    Procedure instructions  Stop blood-thinners  Do not eat or drink fluids (other than water) for 6 hours before your procedure  No water for 2 hours before your procedure  Take your blood pressure medicine with a sip of water  Arrive 30 minutes before your appointment  If sedation is planned, bring suitable driver. [place], [place], & public transportation are NOT APPROVED)  Carefully read the Preparing for your procedure detailed instructions  If you have questions call us  at [phone]  Procedure appointments are for procedures only.   NO medication refills or new problem evaluations will be done on procedure days.   Only the scheduled, pre-approved procedure and side will be done.   ______________________________________________________________________      ______________________________________________________________________    Preparing for your procedure  Appointments: If you think you may not be able to keep your appointment, call 24-48 hours in advance to cancel. We need time to make it available to others.  Procedure visits are for procedures only. During your procedure appointment there will be: NO Prescription Refills*. NO medication changes or discussions*. NO discussion of disability issues*. NO unrelated pain problem evaluations*. NO evaluations to order other pain procedures*. *These will be addressed at a separate and distinct evaluation encounter on the provider's evaluation schedule and not during procedure days.  Instructions: Food intake: Avoid eating anything solid for at least 8 hours prior to your procedure. Clear liquid intake: You may take clear liquids such as water up to 2 hours prior to your procedure. (No carbonated drinks. No soda.) Transportation: Unless otherwise stated by your physician, bring a driver. (Driver cannot be a Market researcher, Pharmacist, community, or any other form of public  transportation.) Morning Medicines: Except for blood thinners, take all of your other morning medications with a sip of water. Make sure to take your heart and blood pressure medicines. If your blood pressure's lower number is above 100, the case will be rescheduled. Blood thinners: Make sure to stop your blood thinners as instructed.  If you take a blood thinner, but were not instructed to stop it, call our office [phone] and ask to talk to a nurse. Not stopping a blood thinner prior to certain procedures could lead to serious complications. Diabetics on insulin: Notify the staff so that you can be scheduled 1st case in the morning. If your diabetes requires high dose insulin, take only  of your normal insulin dose the morning of the procedure and notify the staff that you have done so. Preventing infections: Shower with an antibacterial soap the morning of your procedure.  Build-up your immune system: Take 1000 mg of Vitamin C with every meal (3 times a day) the day prior to your procedure. Antibiotics: Inform the nursing staff if you are taking any antibiotics or if you have any conditions that may require antibiotics prior to procedures. (Example: recent joint implants)   Pregnancy: If you are pregnant make sure to notify the nursing staff. Not doing so may result in injury to the fetus, including death.  Sickness: If you have a cold, fever, or any active infections, call and cancel or reschedule your procedure. Receiving steroids while having an infection may result in complications. Arrival: You must be in the facility at least 30 minutes prior to your scheduled procedure. Tardiness: Your scheduled time is also the cutoff time. If you do not arrive at least 15 minutes prior to your procedure, you will be rescheduled.  Children: Do not bring any children  with you. Make arrangements to keep them home. Dress appropriately: There is always a possibility that your clothing may get soiled. Avoid  long dresses. Valuables: Do not bring any jewelry or valuables.  Reasons to call and reschedule or cancel your procedure: (Following these recommendations will minimize the risk of a serious complication.) Surgeries: Avoid having procedures within 2 weeks of any surgery. (Avoid for 2 weeks before or after any surgery). Flu Shots: Avoid having procedures within 2 weeks of a flu shots or . (Avoid for 2 weeks before or after immunizations). Barium: Avoid having a procedure within 7-10 days after having had a radiological study involving the use of radiological contrast. (Myelograms, Barium swallow or enema study). Heart attacks: Avoid any elective procedures or surgeries for the initial 6 months after a Myocardial Infarction (Heart Attack). Blood thinners: It is imperative that you stop these medications before procedures. Let us  know if you if you take any blood thinner.  Infection: Avoid procedures during or within two weeks of an infection (including chest colds or gastrointestinal problems). Symptoms associated with infections include: Localized redness, fever, chills, night sweats or profuse sweating, burning sensation when voiding, cough, congestion, stuffiness, runny nose, sore throat, diarrhea, nausea, vomiting, cold or Flu symptoms, recent or current infections. It is specially important if the infection is over the area that we intend to treat. Heart and lung problems: Symptoms that may suggest an active cardiopulmonary problem include: cough, chest pain, breathing difficulties or shortness of breath, dizziness, ankle swelling, uncontrolled high or unusually low blood pressure, and/or palpitations. If you are experiencing any of these symptoms, cancel your procedure and contact your primary care physician for an evaluation.  Remember:  Regular Business hours are:  Monday to Thursday 8:00 AM to 4:00 PM  Provider's Schedule: Eric Como, MD:  Procedure days: Tuesday and Thursday 7:30  AM to 4:00 PM  Wallie Sherry, MD:  Procedure days: Monday and Wednesday 7:30 AM to 4:00 PM Last  Updated: 08/19/2023 ______________________________________________________________________      ______________________________________________________________________    General Risks and Possible Complications  Patient Responsibilities: It is important that you read this as it is part of your informed consent. It is our duty to inform you of the risks and possible complications associated with treatments offered to you. It is your responsibility as a patient to read this and to ask questions about anything that is not clear or that you believe was not covered in this document.  Patient's Rights: You have the right to refuse treatment. You also have the right to change your mind, even after initially having agreed to have the treatment done. However, under this last option, if you wait until the last second to change your mind, you may be charged for the materials used up to that point.  Introduction: Medicine is not an Visual merchandiser. Everything in Medicine, including the lack of treatment(s), carries the potential for danger, harm, or loss (which is by definition: Risk). In Medicine, a complication is a secondary problem, condition, or disease that can aggravate an already existing one. All treatments carry the risk of possible complications. The fact that a side effects or complications occurs, does not imply that the treatment was conducted incorrectly. It must be clearly understood that these can happen even when everything is done following the highest safety standards.  No treatment: You can choose not to proceed with the proposed treatment alternative. The "PRO(s)" would include: avoiding the risk of complications associated with the therapy. The "CON(s)"  would include: not getting any of the treatment benefits. These benefits fall under one of three categories: diagnostic; therapeutic; and/or  palliative. Diagnostic benefits include: getting information which can ultimately lead to improvement of the disease or symptom(s). Therapeutic benefits are those associated with the successful treatment of the disease. Finally, palliative benefits are those related to the decrease of the primary symptoms, without necessarily curing the condition (example: decreasing the pain from a flare-up of a chronic condition, such as incurable terminal cancer).  General Risks and Complications: These are associated to most interventional treatments. They can occur alone, or in combination. They fall under one of the following six (6) categories: no benefit or worsening of symptoms; bleeding; infection; nerve damage; allergic reactions; and/or death. No benefits or worsening of symptoms: In Medicine there are no guarantees, only probabilities. No healthcare provider can ever guarantee that a medical treatment will work, they can only state the probability that it may. Furthermore, there is always the possibility that the condition may worsen, either directly, or indirectly, as a consequence of the treatment. Bleeding: This is more common if the patient is taking a blood thinner, either prescription or over the counter (example: Goody Powders, Fish oil, Aspirin, Garlic, etc.), or if suffering a condition associated with impaired coagulation (example: Hemophilia, cirrhosis of the liver, low platelet counts, etc.). However, even if you do not have one on these, it can still happen. If you have any of these conditions, or take one of these drugs, make sure to notify your treating physician. Infection: This is more common in patients with a compromised immune system, either due to disease (example: diabetes, cancer, human immunodeficiency virus [HIV], etc.), or due to medications or treatments (example: therapies used to treat cancer and rheumatological diseases). However, even if you do not have one on these, it can still  happen. If you have any of these conditions, or take one of these drugs, make sure to notify your treating physician. Nerve Damage: This is more common when the treatment is an invasive one, but it can also happen with the use of medications, such as those used in the treatment of cancer. The damage can occur to small secondary nerves, or to large primary ones, such as those in the spinal cord and brain. This damage may be temporary or permanent and it may lead to impairments that can range from temporary numbness to permanent paralysis and/or brain death. Allergic Reactions: Any time a substance or material comes in contact with our body, there is the possibility of an allergic reaction. These can range from a mild skin rash (contact dermatitis) to a severe systemic reaction (anaphylactic reaction), which can result in death. Death: In general, any medical intervention can result in death, most of the time due to an unforeseen complication. ______________________________________________________________________    ______________________________________________________________________    TENS (Device can be purchased online, without prescription. Search: TENS 7000.) Transcutaneous electrical nerve stimulation (TENS) is a method of pain relief involving the use of a mild electrical current. A TENS machine is a small, battery-operated device that has leads connected to sticky pads called electrodes.   Rechargeable 9V batteries:     Electrode placement:   TENS UNIT SAFETY WARNING SHEET and INFORMATION INDICATIONS AND CONTRAINDICTIONS Read the operation manual before using the device. Freight forwarder ([place] ) restricts this device to sale by or on the order of a physician. Observe your physician's precise instructions and let him show you where to apply the electrodes. For a successful therapy,  the correct application of the electrodes is an important factor. Carefully write down the settings your  physician recommended. Indications for use This device is a prescription device and only for symptomatic relief of chronic intractable pain. Contraindications:   Any electrode placement that applies current to the carotid sinus (neck) region.   Patients with implanted electronic devices (for example, a pacemaker) or metallic implants should not undertake.   Any electrode placement that causes current to flow transcerebrally (through the head). The use of unit whenever pain symptoms are undiagnosed, unit etiology is determined.   The use of TENS whenever pain syndromes are undiagnosed, until etiology is established.  WARNINGS AND PRECAUTIONS  Warnings:   The device must be kept out of reach of children.   The safety of device for use during pregnancy or delivery has not been established.   Do not place electrodes on front of the throat. This may result in spasms of the laryngeal and pharyngeal muscles.   Do not place the electrodes over the carotid nerve (side of neck below ear).   The device is not effective for pain of central origin (headaches).   The device may interfere with electronic monitoring equipment (such as ECG monitors and ECG alarms).   Electrodes should not be placed over the eyes, in the mouth, or internally.   These devices have no curative value.   TENS devices should be used only under the continued supervision of a physician.   TENS is a symptomatic treatment and as such suppresses the sensation of pain which would otherwise serve as a protective mechanism. Precautions/Adverse Reactions   Isolated cases of skin irritation may occur at the site of electrode placement following long-term application.   Stimulation should be stopped and electrodes removed until the cause of the irritation can be determined.   Effectiveness is highly dependent upon patient selection by a person qualified in the management of pain patients.   If the device treatment becomes ineffective or unpleasant, stimulation  should be discontinued until reevaluation by a physician/clinician.   Always turn the device off before applying or removing electrodes.   Skin irritation and electrode burns are potential adverse reactions.  PURPOSE: A Transcutaneous Electrical Nerve Stimulator, or TENS, unit is designed to relieve post-operative, acute and chronic pain. It is used for pain caused by peripheral nerves and not central. TENS units are prescription-only devices.  OPERATION: TENS units work in a couple of ways. The first way they are thought to work is by a method called the [employer]. The [employer] states that our brains can only handle one stimulus at a time. When you have chronic pain, this pain signal is constantly being sent to your brain and recognized as pain. When an electrical stimulus is added to the area of pain the body feels this electrical stimulus, and since the brain can only handle one thing at a time, the pain is not transmitted to the brain. The second method thought to be part of TENS unit's success is by way of stimulating our own bodies to release their own natural painkillers. TENS units do not work for everyone and results may vary. Always follow the instructions and warnings in your user's manual.  USE: One of the most important tasks that must be performed is battery maintenance. If you are using a Engineering geologist, always fully charge it and fully deplete it before charging it again. These batteries can develop memories and by not performing this charging task  correctly, your battery's life can be greatly diminished. If your battery does develop a memory you can help expand the memory by charging for 12 - 13 hours and then completely depleting the battery. Always prepare the skin before applying electrodes. Your skin should be clean and free of any lotions or creams. If you are using electrodes that use conductive gel, apply a small, even layer over the electrode. For carbon,  self-adhesive electrodes, apply a drop of water to the electrodes before applying to the skin. The electrodes attach to the lead wires and then the TENS unit. Always grasp the connector and not the cord when inserting or removing. When making adjustments, always make sure the unit's channels (1 and 2) are in the OFF position. The actual settings should be recommended and prescribed by your physician. Medical equipment suppliers don'tset or instruct users as to user settings. When you are using the BURST mode, the unit delivers a series of quick pulses followed by a rest. This cycle repeats itself frequently. Always have channels OFF before changing modes.  For MODULATION mode, the stimulation automatically varies the width of the pulse.  For CONVENTIONAL mode, the stimulation is constant. After the settings have been fine-tuned, set the timer to 30 or 60 minutes. Your physician should also prescribe the use time. When the lights become dim, it means your batteries should be replaced or recharged.  ACCESSORIES: The electrodes and lead wires can be obtained from your medical equipment supplier. Your medical equipment supplier can set up a recurring delivery to accommodate your needs. Electrodes should be replaced once a month and lead wires once every 6 months.  Video Tutorial https://youtu.be/V_[email or web address]?[email or web address]  ______________________________________________________________________   ______________________________________________________________________    OTC Supplements:   The following is a list of over-the-counter (OTC) supplements that have been found to have [employer]) studies suggesting that they may be of some benefits when used in moderation in some chronic pain-related conditions.  NOTE:  Always consult with your primary care provider and/or pharmacist before taking any OTC medications to make sure they will not interact with your current  medications. Always use manufacturer's recommended dosage.  Supplement Possible benefit May be of benefit in treatment of   Turmeric/curcumin anti-inflammatory Joint and muscle aches and pain.  Glucosamine/chondroitin (triple strength) may slow loss of articular cartilage Joint pain.  Vitamin D -3* may suppress release of chemicals associated with inflammation. Increases tolerance to pain. Joint and muscle aches and pain.   Moringa(+) anti-inflammatory with mild analgesic effects Joint and muscle aches and pain.  Melatonin(+) Helps reset sleep cycle. Insomnia.  Vitamin B-12* may help keep nerves and blood cells healthy as well as maintaining function of nervous system Nerve pain (Burning pain)  Alpha-Lipoic-Acid (ALA)* antioxidant that may help with nerve health, pain, and blocking the activation of some inflammatory chemicals Diabetic neuropathy and metabolic syndrome  superoxide dismutase (SOD)** Currently being reviewed.   Tiger Balm Currently being reviewed.   hydrolyzed collagen peptides* Currently being reviewed.  Collagen supplementation may increases bone strength, density, and mass; may improve joint stiffness/mobility, and functionality; and may reduce joint pain. Possible chondroprotective effects. May help with protection of joint health.   Methylsulfonylmethane (MSM)* Currently being reviewed.   CBD(+) Currently being reviewed.   Delta-8 THC(+) Currently being reviewed.   *  Generally Recognized As Safe (GRAS) approved substance.-FDA ([employer]) ** Possibly Safe, but not considered Generally Recognized As Safe (Not GRAS) by the [place]  Food and Drug Administration (FDA) as a  food additive. (+) Not considered Generally Recognized As Safe (Not GRAS) by the [place]  Food and Drug Administration (FDA) as a food additive.  ______________________________________________________________________

## 2024-03-08 NOTE — Progress Notes (Signed)
 Safety precautions to be maintained throughout the outpatient stay will include: orient to surroundings, keep bed in low position, maintain call bell within reach at all times, provide assistance with transfer out of bed and ambulation.

## 2024-03-11 ENCOUNTER — Ambulatory Visit (HOSPITAL_BASED_OUTPATIENT_CLINIC_OR_DEPARTMENT_OTHER): Admitting: Pain Medicine

## 2024-03-11 DIAGNOSIS — Z5189 Encounter for other specified aftercare: Secondary | ICD-10-CM

## 2024-03-11 DIAGNOSIS — Z91199 Patient's noncompliance with other medical treatment and regimen due to unspecified reason: Secondary | ICD-10-CM

## 2024-03-11 NOTE — Patient Instructions (Signed)

## 2024-03-11 NOTE — Progress Notes (Signed)
 Department: [facility]) Date: 03/11/2024  Event: NO SHOW.  Encounter Type: Procedure:. Therapeutic Advance notice: None  Reason: Unknown.          Significance: Sub-optimal improvement due to treatment delays.

## 2024-03-17 ENCOUNTER — Telehealth: Payer: Self-pay

## 2024-03-17 NOTE — Patient Instructions (Signed)

## 2024-03-17 NOTE — Progress Notes (Unsigned)
 PROVIDER NOTE: Interpretation of information contained herein should be left to medically-trained personnel. Specific patient instructions are provided elsewhere under Patient Instructions section of medical record. This document was created in part using STT-dictation technology, any transcriptional errors that may result from this process are unintentional.  Patient: Micheal Gay Type: Established DOB: 07/08/1966 MRN: [id number] PCP: [name], MD  Service: Procedure DOS: 03/18/2024 Setting: Ambulatory Location: Ambulatory outpatient facility Delivery: Face-to-face Provider: [name], MD Specialty: Interventional Pain Management Specialty designation: 09 Location: Outpatient facility Ref. Prov.: [name], MD       Interventional Therapy   Type: Cervical Epidural Steroid injection (CESI) (Interlaminar) #2  Laterality: Right (-RT)  Level: C7-T1 DOS: 03/18/2024  Provider: [name], MD Imaging: Fluoroscopy-guided Spinal ([id number]) Anesthesia: Local anesthesia (1-2% Lidocaine ) Anxiolysis: None                 Sedation: No Sedation                        Medical Necessity Purpose: Diagnostic/Therapeutic Rationale (medical necessity): procedure needed and proper for the diagnosis and/or treatment of [name] medical symptoms and needs. Indications: Cervicalgia, cervical radicular pain, degenerative disc disease, severe enough to impact quality of life or function. 1. Cervicalgia   2. Cervical Grade 1 Retrolisthesis of C3/C4 (Stable)   3. 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 Cervical facet joint pain   5. Cervical facet joint arthropathy   6. Cervical facet hypertrophy (Multilevel) (Bilateral)   7. 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 Abnormal MRI, cervical spine (07/23/2023)   9. DDD (degenerative disc disease), cervical   10. Painful cervical range of motion    NAS-11 Pain score:    Pre-procedure: 8 /10   Post-procedure: 0-No pain/10     Position  Prep  Materials:  Location setting: Procedure suite Position: Prone, on modified reverse trendelenburg to facilitate breathing, with head in head-cradle. Pillows positioned under chest (below chin-level) with cervical spine flexed. Safety Precautions: Patient was assessed for positional comfort and pressure points before starting the procedure. Prepping solution: DuraPrep (Iodine Povacrylex [0.7% available iodine] and Isopropyl Alcohol, 74% w/w) Prep Area: Entire  cervicothoracic region Approach: percutaneous, paramedial Intended target: Posterior cervical epidural space Materials Procedure:  Tray: Epidural Needle(s): Epidural (Tuohy) Qty: 1 Length: (90mm) 3.5-inch Gauge: 17G  H&P (Pre-op Assessment):  [name] is a 58 y.o. (year old), male patient, seen today for interventional treatment. He  has a past surgical history that includes Eye surgery. [name] has a current medication list which includes the following prescription(s): amitriptyline , atorvastatin , clonazepam, empagliflozin , escitalopram , kerendia , gabapentin, indomethacin, meloxicam, metformin, nortriptyline, nystatin , ondansetron , pantoprazole , pioglitazone , nurtec, sildenafil, tamsulosin , and zavzpret , and the following Facility-Administered Medications: pentafluoroprop-tetrafluoroeth. His primarily concern today is the Neck Pain (mid)  Initial Vital Signs:  Pulse/HCG Rate:  ECG Heart Rate: 67 (nsr) Temp: 97.6 F (36.4 C) Resp: 16 BP: 129/85 SpO2: 99 %  BMI: Estimated body mass index is 30.26 kg/m as calculated from the following:   Height as of this encounter: 5' 11.5 (1.816 m).   Weight as of this encounter: 220 lb (99.8 kg).  Risk Assessment: Allergies: Reviewed. He is allergic to other and pollen extract.  Allergy Precautions: None required Coagulopathies: Reviewed. None identified.  Blood-thinner therapy: None at this time Active  Infection(s): Reviewed. None identified. [name] is afebrile  Site Confirmation: [name] was asked to confirm the procedure and  laterality before marking the site Procedure checklist: Completed Consent: Before the procedure and under the influence of no sedative(s), amnesic(s), or anxiolytics, the patient was informed of the treatment options, risks and possible complications. To fulfill our ethical and legal obligations, as recommended by the American Medical Association's Code of Ethics, I have informed the patient of my clinical impression; the nature and purpose of the treatment or procedure; the risks, benefits, and possible complications of the intervention; the alternatives, including doing nothing; the risk(s) and benefit(s) of the alternative treatment(s) or procedure(s); and the risk(s) and benefit(s) of doing nothing. The patient was provided information about the general risks and possible complications associated with the procedure. These may include, but are not limited to: failure to achieve desired goals, infection, bleeding, organ or nerve damage, allergic reactions, paralysis, and death. In addition, the patient was informed of those risks and complications associated to Spine-related procedures, such as failure to decrease pain; infection (i.e.: Meningitis, epidural or intraspinal abscess); bleeding (i.e.: epidural hematoma, subarachnoid hemorrhage, or any other type of intraspinal or peri-dural bleeding); organ or nerve damage (i.e.: Any type of peripheral nerve, nerve root, or spinal cord injury) with subsequent damage to sensory, motor, and/or autonomic systems, resulting in permanent pain, numbness, and/or weakness of one or several areas of the body; allergic reactions; (i.e.: anaphylactic reaction); and/or death. Furthermore, the patient was informed of those risks and complications associated with the medications. These include, but are not limited to: allergic reactions  (i.e.: anaphylactic or anaphylactoid reaction(s)); adrenal axis suppression; blood sugar elevation that in diabetics may result in ketoacidosis or comma; water retention that in patients with history of congestive heart failure may result in shortness of breath, pulmonary edema, and decompensation with resultant heart failure; weight gain; swelling or edema; medication-induced neural toxicity; particulate matter embolism and blood vessel occlusion with resultant organ, and/or nervous system infarction; and/or aseptic necrosis of one or more joints. Finally, the patient was informed that Medicine is not an exact science; therefore, there is also the possibility of unforeseen or unpredictable risks and/or possible complications that may result in a catastrophic outcome. The patient indicated having understood very clearly. We have given the patient no guarantees and we have made no promises. Enough time was given to the patient to ask questions, all of which were answered to the patient's satisfaction. Mr. Ayer has indicated that he wanted to continue with the procedure. Attestation: I, the ordering provider, attest that I have discussed with the patient the benefits, risks, side-effects, alternatives, likelihood of achieving goals, and potential problems during recovery for the procedure that I have provided informed consent. Date  Time: 03/18/2024 11:40 AM  Pre-Procedure Preparation:  Monitoring: As per clinic protocol. Respiration, ETCO2, SpO2, BP, heart rate and rhythm monitor placed and checked for adequate function Safety Precautions: Patient was assessed for positional comfort and pressure points before starting the procedure. Time-out: I initiated and conducted the Time-out before starting the procedure, as per protocol. The patient was asked to participate by confirming the accuracy of the Time Out information. Verification of the correct person, site, and procedure were performed and  confirmed by me, the nursing staff, and the patient. Time-out conducted as per Joint Commission's Universal Protocol (UP.01.01.01). Time: 1225 Start Time: 1225 hrs.  Description  Narrative of Procedure:          Rationale (medical necessity): procedure needed and proper for the diagnosis and/or treatment of the patient's medical symptoms and needs. Start Time: 1225 hrs. Safety Precautions:  Aspiration looking for blood return was conducted prior to all injections. At no point did we inject any substances, as a needle was being advanced. No attempts were made at seeking any paresthesias. Safe injection practices and needle disposal techniques used. Medications properly checked for expiration dates. SDV (single dose vial) medications used. Description of procedure: Protocol guidelines were followed. The patient was assisted into a comfortable position. The target area was identified and the area prepped in the usual manner. Skin & deeper tissues infiltrated with local anesthetic. Appropriate amount of time allowed to pass for local anesthetics to take effect. Using fluoroscopic guidance, the epidural needle was introduced through the skin, ipsilateral to the reported pain, and advanced to the target area. Posterior laminar os was contacted and the needle walked caudad, until the lamina was cleared. The ligamentum flavum was engaged and the epidural space identified using "loss-of-resistance technique" with 2-3 ml of PF-NaCl (0.9% NSS), in a 5cc dedicated LOR syringe. (See Imaging guidance below for use of contrast details.) Once proper needle placement was secured, and negative aspiration confirmed, the solution was injected in intermittent fashion, asking for systemic symptoms every 0.5cc. The needles were then removed and the area cleansed, making sure to leave some of the prepping solution back to take advantage of its long term bactericidal properties.  Vitals:   03/18/24 1139 03/18/24 1225 03/18/24  1230 03/18/24 1233  BP: 129/85 128/86 127/79 130/81  Resp: 16 17 16    Temp: 97.6 F (36.4 C)     SpO2: 99% 97% 98%   Weight: 220 lb (99.8 kg)     Height: 5' 11.5 (1.816 m)        End Time: 1231 hrs.  Imaging Guidance (Spinal):          Type of Imaging Technique: Fluoroscopy Guidance (Spinal) Indication(s): Fluoroscopy guidance for needle placement to enhance accuracy in procedures requiring precise needle localization for targeted delivery of medication in or near specific anatomical locations not easily accessible without such real-time imaging assistance. Exposure Time: Please see nurses notes. Contrast: Before injecting any contrast, we confirmed that the patient did not have an allergy to iodine, shellfish, or radiological contrast. Once satisfactory needle placement was completed at the desired level, radiological contrast was injected. Contrast injected under live fluoroscopy. No contrast complications. See chart for type and volume of contrast used. Fluoroscopic Guidance: I was personally present during the use of fluoroscopy. Tunnel Vision Technique used to obtain the best possible view of the target area. Parallax error corrected before commencing the procedure. Direction-depth-direction technique used to introduce the needle under continuous pulsed fluoroscopy. Once target was reached, antero-posterior, oblique, and lateral fluoroscopic projection used confirm needle placement in all planes. Images permanently stored in EMR. Interpretation: I personally interpreted the imaging intraoperatively. Adequate needle placement confirmed in multiple planes. Appropriate spread of contrast into desired area was observed. No evidence of afferent or efferent intravascular uptake. No intrathecal or subarachnoid spread observed. Permanent images saved into the patient's record.  Post-operative Assessment:  Post-procedure Vital Signs:  Pulse/HCG Rate:  63 Temp: 97.6 F (36.4 C) Resp: 16 BP:  130/81 SpO2: 98 %  EBL: None  Complications: No immediate post-treatment complications observed by team, or reported by patient.  Note: The patient tolerated the entire procedure well. A repeat set of vitals were taken after the procedure and the patient was kept under observation following institutional policy, for this type of procedure. Post-procedural neurological assessment was performed, showing return to baseline, prior to discharge. The  patient was provided with post-procedure discharge instructions, including a section on how to identify potential problems. Should any problems arise concerning this procedure, the patient was given instructions to immediately contact us , at any time, without hesitation. In any case, we plan to contact the patient by telephone for a follow-up status report regarding this interventional procedure.  Comments:  No additional relevant information.  Plan of Care (POC)  Orders:  Orders Placed This Encounter  Procedures   Cervical Epidural Injection    Indication(s): Radiculitis and cervicalgia associated with cervical degenerative disc disease. Position: Prone Imaging guidance: Fluoroscopy required. Contrast required unless contraindicated by allergy or severe CKD. Equipment & Materials: Epidural tray & needle.    Scheduling Instructions:     Procedure: Cervical Epidural Steroid Injection/Block     Planned Level(s): C7-T1     Laterality: Midline     Anxiolysis: Patient's choice.     Date: 03/17/2024    Where will this procedure be performed?:   [facility]             by Dr. T[facility] 1-60 MIN NO REPORT    Intraoperative interpretation by procedural physician at [facility].    Standing Status:   Standing    Number of Occurrences:   1    Reason for exam::   Assistance in needle guidance and placement for procedures requiring needle placement in or near specific anatomical locations not easily accessible without  such assistance.   Informed Consent Details: Physician/Practitioner Attestation; Transcribe to consent form and obtain patient signature    Nursing instructions: Transcribe to consent form and obtain patient signature. Always confirm laterality of pain with Mr. Yankee, before procedure.    Physician/Practitioner attestation of informed consent for procedure/surgical case:   I, the physician/practitioner, attest that I have discussed with the patient the benefits, risks, side effects, alternatives, likelihood of achieving goals and potential problems during recovery for the procedure that I have provided informed consent.    Procedure:   Cervical Epidural Steroid Injection (CESI) under fluoroscopic guidance    Physician/Practitioner performing the procedure:   Jori Frerichs A. Tanya MD    Indication/Reason:   Indications: Cervicalgia (neck pain), cervical radicular pain, radiculitis (arm/shoulder pain, numbness, and/or weakness), degenerative disc disease, severe enough to greatly impact quality of life or function.   Provide equipment / supplies at bedside    Procedural tray: Epidural Tray (Disposable  single use) Skin infiltration needle: Regular 1.5-in, 25-G, (x1) Block needle size: Regular standard Catheter: No catheter required    Standing Status:   Standing    Number of Occurrences:   1    Specify:   Epidural Tray     Analgesic: No chronic opioid analgesics therapy prescribed by our practice. None. MME/day: 0 mg/day    Medications ordered for procedure: Meds ordered this encounter  Medications   iohexol  (OMNIPAQUE ) 180 MG/ML injection 10 mL    Must be Myelogram-compatible. If not available, you may substitute with a water-soluble, non-ionic, hypoallergenic, myelogram-compatible radiological contrast medium.   lidocaine  (XYLOCAINE ) 2 % (with pres) injection 400 mg   pentafluoroprop-tetrafluoroeth (GEBAUERS) aerosol   DISCONTD: midazolam  (VERSED ) injection 0.5-2 mg    Make sure  Flumazenil is available in the pyxis when using this medication. If oversedation occurs, administer 0.2 mg IV over 15 sec. If after 45 sec no response, administer 0.2 mg again over 1 min; may repeat at 1 min intervals; not to exceed 4 doses (1 mg)  sodium chloride  flush (NS) 0.9 % injection 1 mL   ropivacaine  (PF) 2 mg/mL (0.2%) (NAROPIN ) injection 1 mL   dexamethasone  (DECADRON ) injection 10 mg   Medications administered: We administered iohexol , lidocaine , sodium chloride  flush, ropivacaine  (PF) 2 mg/mL (0.2%), and dexamethasone .  See the medical record for exact dosing, route, and time of administration.    Interventional Therapies  Risk Factors  Considerations  Medical Comorbidities:  BPH  HTN  GERD  Hx. BNZ use  T2NIDDM  Bipolar     Planned  Pending:   Diagnostic right cervical ESI #2  Referral to physical therapy entered (10/29/2023)    Under consideration:   Diagnostic right cervical ESI #1 w/ possible series of 3.  Diagnostic bilateral cervical facet MBB #1 w/ possible follow-up RFA.    Completed:   Diagnostic midline cervical ESI x1 (11/18/2023)    Therapeutic  Palliative (PRN) options:   None established   Completed by other providers:   None reported       Follow-up plan:   Return in about 2 weeks (around 04/01/2024) for (Face2F), (PPE).     Recent Visits Date Type Provider Dept  03/08/24 Office Visit Tanya Glisson, MD [facility]  02/04/24 Office Visit Tanya Glisson, MD [facility]  Showing recent visits within past 90 days and meeting all other requirements Today's Visits Date Type Provider Dept  03/18/24 Procedure visit Tanya Glisson, MD [facility]  Showing today's visits and meeting all other requirements Future Appointments Date Type Provider Dept  04/05/24 Appointment Tanya Glisson, MD [facility]  Showing future appointments within next 90 days and meeting all other  requirements   Disposition: Discharge home  Discharge (Date  Time): 03/18/2024; 1246 hrs.   Primary Care Physician: [name], MD Location: [facility] Note by: Glisson DELENA Tanya, MD (TTS technology used. I apologize for any typographical errors that were not detected and corrected.) Date: 03/18/2024; Time: 12:49 PM  Disclaimer:  Medicine is not an Visual merchandiser. The only guarantee in medicine is that nothing is guaranteed. It is important to note that the decision to proceed with this intervention was based on the information collected from the patient. The Data and conclusions were drawn from the patient's questionnaire, the interview, and the physical examination. Because the information was provided in large part by the patient, it cannot be guaranteed that it has not been purposely or unconsciously manipulated. Every effort has been made to obtain as much relevant data as possible for this evaluation. It is important to note that the conclusions that lead to this procedure are derived in large part from the available data. Always take into account that the treatment will also be dependent on availability of resources and existing treatment guidelines, considered by other Pain Management Practitioners as being common knowledge and practice, at the time of the intervention. For Medico-Legal purposes, it is also important to point out that variation in procedural techniques and pharmacological choices are the acceptable norm. The indications, contraindications, technique, and results of the above procedure should only be interpreted and judged by a Board-Certified Interventional Pain Specialist with extensive familiarity and expertise in the same exact procedure and technique.

## 2024-03-17 NOTE — Telephone Encounter (Signed)
 Attempt to contact patient to come tomorrow at 0940. No answer LVM.

## 2024-03-17 NOTE — Telephone Encounter (Signed)
 Attempt to contact patient to come tomorrow at 0940. No answer and VM box is now full.

## 2024-03-18 ENCOUNTER — Encounter: Payer: Self-pay | Admitting: Pain Medicine

## 2024-03-18 ENCOUNTER — Ambulatory Visit (HOSPITAL_BASED_OUTPATIENT_CLINIC_OR_DEPARTMENT_OTHER): Admitting: Pain Medicine

## 2024-03-18 ENCOUNTER — Ambulatory Visit
Admission: RE | Admit: 2024-03-18 | Discharge: 2024-03-18 | Disposition: A | Discharge status: Home / Self Care | Source: Ambulatory Visit | Attending: Pain Medicine | Admitting: Pain Medicine

## 2024-03-18 VITALS — BP 130/81 | Temp 97.6°F | Resp 16 | Ht 71.5 in | Wt 220.0 lb

## 2024-03-18 DIAGNOSIS — M4310 Spondylolisthesis, site unspecified: Secondary | ICD-10-CM | POA: Diagnosis present

## 2024-03-18 DIAGNOSIS — M542 Cervicalgia: Secondary | ICD-10-CM | POA: Insufficient documentation

## 2024-03-18 DIAGNOSIS — M47812 Spondylosis without myelopathy or radiculopathy, cervical region: Secondary | ICD-10-CM | POA: Insufficient documentation

## 2024-03-18 DIAGNOSIS — M5030 Other cervical disc degeneration, unspecified cervical region: Secondary | ICD-10-CM | POA: Insufficient documentation

## 2024-03-18 DIAGNOSIS — R937 Abnormal findings on diagnostic imaging of other parts of musculoskeletal system: Secondary | ICD-10-CM

## 2024-03-18 DIAGNOSIS — M4802 Spinal stenosis, cervical region: Secondary | ICD-10-CM

## 2024-03-18 MED ORDER — MIDAZOLAM HCL 2 MG/2ML IJ SOLN: 0.5000 mg | Freq: Once | INTRAMUSCULAR | Status: DC

## 2024-03-18 MED ORDER — DEXAMETHASONE SODIUM PHOSPHATE 10 MG/ML IJ SOLN
INTRAMUSCULAR | Status: AC
  Filled 2024-03-18: qty 1

## 2024-03-18 MED ORDER — LIDOCAINE HCL 2 % IJ SOLN
20.0000 mL | Freq: Once | INTRAMUSCULAR | Status: AC
  Administered 2024-03-18: 400 mg

## 2024-03-18 MED ORDER — SODIUM CHLORIDE 0.9% FLUSH
1.0000 mL | Freq: Once | INTRAVENOUS | Status: AC
  Administered 2024-03-18: 1 mL

## 2024-03-18 MED ORDER — ROPIVACAINE HCL 2 MG/ML IJ SOLN
INTRAMUSCULAR | Status: AC
  Filled 2024-03-18: qty 20

## 2024-03-18 MED ORDER — ROPIVACAINE HCL 2 MG/ML IJ SOLN
1.0000 mL | Freq: Once | INTRAMUSCULAR | Status: AC
  Administered 2024-03-18: 1 mL via EPIDURAL

## 2024-03-18 MED ORDER — DEXAMETHASONE SODIUM PHOSPHATE 10 MG/ML IJ SOLN
10.0000 mg | Freq: Once | INTRAMUSCULAR | Status: AC
  Administered 2024-03-18: 10 mg

## 2024-03-18 MED ORDER — IOHEXOL 180 MG/ML  SOLN
10.0000 mL | Freq: Once | INTRAMUSCULAR | Status: AC
  Administered 2024-03-18: 10 mL via EPIDURAL

## 2024-03-18 MED ORDER — PENTAFLUOROPROP-TETRAFLUOROETH EX AERO
INHALATION_SPRAY | CUTANEOUS | Status: AC
  Filled 2024-03-18: qty 60

## 2024-03-18 MED ORDER — PENTAFLUOROPROP-TETRAFLUOROETH EX AERO: INHALATION_SPRAY | Freq: Once | CUTANEOUS | Status: DC

## 2024-03-18 MED ORDER — SODIUM CHLORIDE (PF) 0.9 % IJ SOLN
INTRAMUSCULAR | Status: AC
Start: 2024-03-18 — End: 2024-03-18
  Filled 2024-03-18: qty 10

## 2024-03-18 MED ORDER — LIDOCAINE HCL (PF) 2 % IJ SOLN
INTRAMUSCULAR | Status: AC
Start: 2024-03-18 — End: 2024-03-18
  Filled 2024-03-18: qty 10

## 2024-03-18 MED ORDER — IOHEXOL 180 MG/ML  SOLN
INTRAMUSCULAR | Status: AC
  Filled 2024-03-18: qty 10

## 2024-03-18 MED ORDER — MIDAZOLAM HCL 5 MG/5ML IJ SOLN
INTRAMUSCULAR | Status: AC
Start: 2024-03-18 — End: 2024-03-18
  Filled 2024-03-18: qty 5

## 2024-03-18 NOTE — Progress Notes (Signed)
 Safety precautions to be maintained throughout the outpatient stay will include: orient to surroundings, keep bed in low position, maintain call bell within reach at all times, provide assistance with transfer out of bed and ambulation.

## 2024-03-29 ENCOUNTER — Other Ambulatory Visit: Payer: Self-pay | Admitting: Internal Medicine

## 2024-04-04 NOTE — Progress Notes (Unsigned)
 PROVIDER NOTE: Interpretation of information contained herein should be left to medically-trained personnel. Specific patient instructions are provided elsewhere under Patient Instructions section of medical record. This document was created in part using AI and STT-dictation technology, any transcriptional errors that may result from this process are unintentional.  Patient: Micheal Gay  Service: E/M   PCP: [name], MD  DOB: [birth year]  DOS: 04/05/2024  Provider: [name], MD  MRN: [id number]  Delivery: Face-to-face  Specialty: Interventional Pain Management  Type: Established Patient  Setting: Ambulatory outpatient facility  Specialty designation: 09  Referring Prov.: [name], MD  Location: Outpatient office facility       History of present illness (HPI) Micheal Gay, a 58 y.o. year old male, is here today because of his Cervicalgia [M54.2]. Micheal Gay primary complain today is No chief complaint on file.  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  Pain Assessment: Severity of   is reported as a  /10. Location:    / . Onset:  . Quality:  . Timing:  . Modifying factor(s):  SABRA Vitals:  vitals were not taken for this  visit.  BMI: Estimated body mass index is 30.26 kg/m as calculated from the following:   Height as of 03/18/24: 5' 11.5 (1.816 m).   Weight as of 03/18/24: 220 lb (99.8 kg).  Last encounter: 03/08/2024. Last procedure: 03/18/2024.  Reason for encounter: post-procedure evaluation and assessment.   Discussed the use of AI scribe software for clinical note transcription with the patient, who gave verbal consent to proceed.  History of Present Illness          Post-Procedure Evaluation   Type: Cervical Epidural Steroid injection (CESI) (Interlaminar) #2  Laterality: Right (-RT)  Level: C7-T1 DOS: 03/18/2024  Provider: [name], MD Imaging: Fluoroscopy-guided Spinal ([id number]) Anesthesia: Local anesthesia (1-2% Lidocaine ) Anxiolysis: None                 Sedation: No Sedation                        Medical Necessity Purpose: Diagnostic/Therapeutic Rationale (medical necessity): procedure needed and proper for the diagnosis and/or treatment of Micheal Gay medical symptoms and needs. Indications: Cervicalgia, cervical radicular pain, degenerative disc disease, severe enough to impact quality of life or function. 1. Cervicalgia   2. Cervical Grade 1 Retrolisthesis of C3/C4 (Stable)   3. 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 Cervical facet joint pain   5. Cervical facet joint arthropathy   6. Cervical facet hypertrophy (Multilevel) (Bilateral)   7. 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 Abnormal MRI, cervical spine (07/23/2023)   9. DDD (degenerative disc disease), cervical   10. Painful cervical range of motion    NAS-11 Pain score:   Pre-procedure: 8 /10   Post-procedure: 0-No pain/10     Effectiveness:  Initial hour after procedure:   ***. Subsequent 4-6 hours post-procedure:   ***. Analgesia past initial 6 hours:   ***. Ongoing improvement:  Analgesic:  *** Function:    ***    ROM:    ***     Pharmacotherapy Assessment    Analgesic: No chronic opioid analgesics therapy prescribed by our practice. None. MME/day: 0 mg/day   Monitoring: [place] PMP: PDMP reviewed during this encounter.       Pharmacotherapy: No side-effects or adverse reactions reported. Compliance: No problems identified. Effectiveness: Clinically acceptable.  No notes on file  UDS:  Summary  Date Value Ref Range Status  10/08/2023 FINAL  Final    Comment:    ==================================================================== Compliance Drug Analysis, Ur ==================================================================== Test                             Result       Flag       Units  Drug Present and Declared for Prescription Verification   7-aminoclonazepam              161          EXPECTED   ng/mg creat    7-aminoclonazepam is an expected metabolite of clonazepam. Source of    clonazepam is a scheduled prescription medication.    Gabapentin                     PRESENT      EXPECTED  Drug Absent but Declared for Prescription Verification   Amitriptyline                   Not Detected UNEXPECTED   Nortriptyline                  Not Detected UNEXPECTED   Promethazine                    Not Detected UNEXPECTED ==================================================================== Test                      Result    Flag   Units      Ref Range   Creatinine              49               mg/dL      >=79 ==================================================================== Declared Medications:  The flagging and interpretation on this report are based on the  following declared medications.  Unexpected results may arise from  inaccuracies in the declared medications.   **Note: The testing scope of this panel includes these medications:   Amitriptyline  (Elavil )  Clonazepam (Klonopin)  Gabapentin  Nortriptyline (Pamelor)  Promethazine  (Phenergan )   **Note: The testing scope of this panel does not include the  following reported  medications:   Atorvastatin  (Lipitor)  Empagliflozin  (Jardiance )  Indomethacin (Indocin)  Meloxicam (Mobic)  Metformin  Ondansetron  (Zofran )  Pantoprazole  (Protonix )  Pioglitazone  (Actos )  Rimegepant (Nurtec)  Sildenafil (Viagra)  Zavegepant (Zavzpret ) ==================================================================== For clinical consultation, please call [phone]. ====================================================================     No results found for: CBDTHCR No results found for: D8THCCBX No results found for: D9THCCBX  ROS  Constitutional: Denies any fever or chills Gastrointestinal: No reported hemesis, hematochezia, vomiting, or acute GI distress Musculoskeletal: Denies any acute onset joint swelling, redness, loss of ROM, or weakness Neurological: No reported episodes of acute onset apraxia, aphasia, dysarthria, agnosia, amnesia, paralysis, loss of  coordination, or loss of consciousness  Medication Review  Finerenone , Rimegepant Sulfate, Zavegepant HCl, amitriptyline , atorvastatin , clonazePAM, empagliflozin , escitalopram , gabapentin, indomethacin, meloxicam, metFORMIN, nortriptyline, nystatin , ondansetron , pantoprazole , pioglitazone , sildenafil, and tamsulosin   History Review  Allergy: Micheal Gay is allergic to other and pollen extract. Drug: Micheal Gay  reports no history of drug use. Alcohol:  reports no history of alcohol use. Tobacco:  reports that he has never smoked. He quit smokeless tobacco use about 41 years ago.  His smokeless tobacco use included chew. Social: Micheal Gay  reports that he has never smoked. He quit smokeless tobacco use about 41 years ago.  His smokeless tobacco use included chew. He reports that he does not drink alcohol and does not use drugs. Medical:  has a past medical history of Bipolar 1 disorder (HCC), Depression, Diabetes mellitus without complication (HCC), Keratoconus of right eye, Sepsis (HCC) (09/22/2016),  and Septicemia (HCC) (09/22/2016). Surgical: Micheal Gay  has a past surgical history that includes Eye surgery. Family: family history includes Breast cancer in his mother; Diabetes in his mother; Heart attack in his father.  Laboratory Chemistry Profile   Renal Lab Results  Component Value Date   BUN 31 (H) 02/19/2024   CREATININE 1.18 02/19/2024   BCR 26 (H) 02/19/2024   GFRAA >60 01/18/2017   GFRNONAA >60 12/21/2022    Hepatic Lab Results  Component Value Date   AST 65 (H) 02/19/2024   ALT 48 (H) 02/19/2024   ALBUMIN 4.5 02/19/2024   ALKPHOS 69 02/19/2024    Electrolytes Lab Results  Component Value Date   NA 139 02/19/2024   K 4.2 02/19/2024   CL 99 02/19/2024   CALCIUM  9.2 02/19/2024   MG 2.0 10/08/2023    Bone Lab Results  Component Value Date   25OHVITD1 27 (L) 10/08/2023   25OHVITD2 <1.0 10/08/2023   25OHVITD3 26 10/08/2023    Inflammation (CRP: Acute Phase) (ESR: Chronic Phase) Lab Results  Component Value Date   CRP <1 10/08/2023   ESRSEDRATE 23 10/08/2023   LATICACIDVEN 1.5 09/22/2016         Note: Above Lab results reviewed.  Recent Imaging Review  DG PAIN CLINIC C-ARM 1-60 MIN NO REPORT Fluoro was used, but no Radiologist interpretation will be provided.  Please refer to NOTES tab for provider progress note. Note: Reviewed        Physical Exam  Vitals: There were no vitals taken for this visit. BMI: Estimated body mass index is 30.26 kg/m as calculated from the following:   Height as of 03/18/24: 5' 11.5 (1.816 m).   Weight as of 03/18/24: 220 lb (99.8 kg). Ideal: Patient weight not recorded General appearance: Well nourished, well developed, and well hydrated. In no apparent acute distress Mental status: Alert, oriented x 3 (person, place, & time)       Respiratory: No evidence of acute respiratory distress Eyes: PERLA   Assessment   Diagnosis Status  1. Cervicalgia   2. Cervical Grade 1 Retrolisthesis of C3/C4 (Stable)   3.  Cervical facet joint pain   4. Painful cervical range of motion   5. Postop check    Controlled Controlled Controlled   Updated Problems: No problems updated.  Plan of Care  Problem-specific:  Assessment and Plan            Micheal Gay has a current medication list which includes the following long-term medication(s): amitriptyline , atorvastatin , clonazepam, escitalopram , gabapentin, metformin, nortriptyline, pioglitazone , and sildenafil.  Pharmacotherapy (Medications Ordered): No  orders of the defined types were placed in this encounter.  Orders:  No orders of the defined types were placed in this encounter.    Interventional Therapies  Risk Factors  Considerations  Medical Comorbidities:  BPH  HTN  GERD  Hx. BNZ use  T2NIDDM  Bipolar     Planned  Pending:   Diagnostic right cervical ESI #2  Referral to physical therapy entered (10/29/2023)    Under consideration:   Diagnostic right cervical ESI #1 w/ possible series of 3.  Diagnostic bilateral cervical facet MBB #1 w/ possible follow-up RFA.    Completed:   Diagnostic midline cervical ESI x1 (11/18/2023)    Therapeutic  Palliative (PRN) options:   None established   Completed by other providers:   None reported      No follow-ups on file.    Recent Visits Date Type Provider Dept  03/18/24 Procedure visit Tanya Glisson, MD [facility]  03/08/24 Office Visit Tanya Glisson, MD [facility]  02/04/24 Office Visit Tanya Glisson, MD [facility]  Showing recent visits within past 90 days and meeting all other requirements Future Appointments Date Type Provider Dept  04/05/24 Appointment Tanya Glisson, MD [facility]  Showing future appointments within next 90 days and meeting all other requirements  I discussed the assessment and treatment plan with the patient. The patient was provided an opportunity to ask questions and all were  answered. The patient agreed with the plan and demonstrated an understanding of the instructions.  Patient advised to call back or seek an in-person evaluation if the symptoms or condition worsens.  Duration of encounter: *** minutes.  Total time on encounter, as per AMA guidelines included both the face-to-face and non-face-to-face time personally spent by the physician and/or other qualified health care professional(s) on the day of the encounter (includes time in activities that require the physician or other qualified health care professional and does not include time in activities normally performed by clinical staff). Physician's time may include the following activities when performed: Preparing to see the patient (e.g., pre-charting review of records, searching for previously ordered imaging, lab work, and nerve conduction tests) Review of prior analgesic pharmacotherapies. Reviewing PMP Interpreting ordered tests (e.g., lab work, imaging, nerve conduction tests) Performing post-procedure evaluations, including interpretation of diagnostic procedures Obtaining and/or reviewing separately obtained history Performing a medically appropriate examination and/or evaluation Counseling and educating the patient/family/caregiver Ordering medications, tests, or procedures Referring and communicating with other health care professionals (when not separately reported) Documenting clinical information in the electronic or other health record Independently interpreting results (not separately reported) and communicating results to the patient/ family/caregiver Care coordination (not separately reported)  Note by: Glisson DELENA Tanya, MD (TTS and AI technology used. I apologize for any typographical errors that were not detected and corrected.) Date: 04/05/2024; Time: 6:38 PM

## 2024-04-05 ENCOUNTER — Encounter: Payer: Self-pay | Admitting: Pain Medicine

## 2024-04-05 ENCOUNTER — Ambulatory Visit: Attending: Pain Medicine | Admitting: Pain Medicine

## 2024-04-05 VITALS — BP 124/72 | HR 77 | Temp 98.5°F | Resp 16 | Ht 71.5 in | Wt 220.0 lb

## 2024-04-05 DIAGNOSIS — M542 Cervicalgia: Secondary | ICD-10-CM | POA: Insufficient documentation

## 2024-04-05 DIAGNOSIS — M4310 Spondylolisthesis, site unspecified: Secondary | ICD-10-CM | POA: Diagnosis present

## 2024-04-05 DIAGNOSIS — M47812 Spondylosis without myelopathy or radiculopathy, cervical region: Secondary | ICD-10-CM | POA: Insufficient documentation

## 2024-04-05 DIAGNOSIS — Z09 Encounter for follow-up examination after completed treatment for conditions other than malignant neoplasm: Secondary | ICD-10-CM | POA: Insufficient documentation

## 2024-04-05 NOTE — Patient Instructions (Addendum)
 ______________________________________________________________________    Procedure instructions  Stop blood-thinners  Do not eat or drink fluids (other than water) for 6 hours before your procedure  No water for 2 hours before your procedure  Take your blood pressure medicine with a sip of water  Arrive 30 minutes before your appointment  If sedation is planned, bring suitable driver. [place], [place], & public transportation are NOT APPROVED)  Carefully read the Preparing for your procedure detailed instructions  If you have questions call us  at [phone]  Procedure appointments are for procedures only.   NO medication refills or new problem evaluations will be done on procedure days.   Only the scheduled, pre-approved procedure and side will be done.   ______________________________________________________________________      ______________________________________________________________________    Preparing for your procedure  Appointments: If you think you may not be able to keep your appointment, call 24-48 hours in advance to cancel. We need time to make it available to others.  Procedure visits are for procedures only. During your procedure appointment there will be: NO Prescription Refills*. NO medication changes or discussions*. NO discussion of disability issues*. NO unrelated pain problem evaluations*. NO evaluations to order other pain procedures*. *These will be addressed at a separate and distinct evaluation encounter on the provider's evaluation schedule and not during procedure days.  Instructions: Food intake: Avoid eating anything solid for at least 8 hours prior to your procedure. Clear liquid intake: You may take clear liquids such as water up to 2 hours prior to your procedure. (No carbonated drinks. No soda.) Transportation: Unless otherwise stated by your physician, bring a driver. (Driver cannot be a Market researcher, Pharmacist, community, or any other form of public  transportation.) Morning Medicines: Except for blood thinners, take all of your other morning medications with a sip of water. Make sure to take your heart and blood pressure medicines. If your blood pressure's lower number is above 100, the case will be rescheduled. Blood thinners: Make sure to stop your blood thinners as instructed.  If you take a blood thinner, but were not instructed to stop it, call our office [phone] and ask to talk to a nurse. Not stopping a blood thinner prior to certain procedures could lead to serious complications. Diabetics on insulin: Notify the staff so that you can be scheduled 1st case in the morning. If your diabetes requires high dose insulin, take only  of your normal insulin dose the morning of the procedure and notify the staff that you have done so. Preventing infections: Shower with an antibacterial soap the morning of your procedure.  Build-up your immune system: Take 1000 mg of Vitamin C with every meal (3 times a day) the day prior to your procedure. Antibiotics: Inform the nursing staff if you are taking any antibiotics or if you have any conditions that may require antibiotics prior to procedures. (Example: recent joint implants)   Pregnancy: If you are pregnant make sure to notify the nursing staff. Not doing so may result in injury to the fetus, including death.  Sickness: If you have a cold, fever, or any active infections, call and cancel or reschedule your procedure. Receiving steroids while having an infection may result in complications. Arrival: You must be in the facility at least 30 minutes prior to your scheduled procedure. Tardiness: Your scheduled time is also the cutoff time. If you do not arrive at least 15 minutes prior to your procedure, you will be rescheduled.  Children: Do not bring any children  with you. Make arrangements to keep them home. Dress appropriately: There is always a possibility that your clothing may get soiled. Avoid  long dresses. Valuables: Do not bring any jewelry or valuables.  Reasons to call and reschedule or cancel your procedure: (Following these recommendations will minimize the risk of a serious complication.) Surgeries: Avoid having procedures within 2 weeks of any surgery. (Avoid for 2 weeks before or after any surgery). Flu Shots: Avoid having procedures within 2 weeks of a flu shots or . (Avoid for 2 weeks before or after immunizations). Barium: Avoid having a procedure within 7-10 days after having had a radiological study involving the use of radiological contrast. (Myelograms, Barium swallow or enema study). Heart attacks: Avoid any elective procedures or surgeries for the initial 6 months after a Myocardial Infarction (Heart Attack). Blood thinners: It is imperative that you stop these medications before procedures. Let us  know if you if you take any blood thinner.  Infection: Avoid procedures during or within two weeks of an infection (including chest colds or gastrointestinal problems). Symptoms associated with infections include: Localized redness, fever, chills, night sweats or profuse sweating, burning sensation when voiding, cough, congestion, stuffiness, runny nose, sore throat, diarrhea, nausea, vomiting, cold or Flu symptoms, recent or current infections. It is specially important if the infection is over the area that we intend to treat. Heart and lung problems: Symptoms that may suggest an active cardiopulmonary problem include: cough, chest pain, breathing difficulties or shortness of breath, dizziness, ankle swelling, uncontrolled high or unusually low blood pressure, and/or palpitations. If you are experiencing any of these symptoms, cancel your procedure and contact your primary care physician for an evaluation.  Remember:  Regular Business hours are:  Monday to Thursday 8:00 AM to 4:00 PM  Provider's Schedule: Eric Como, MD:  Procedure days: Tuesday and Thursday 7:30  AM to 4:00 PM  Wallie Sherry, MD:  Procedure days: Monday and Wednesday 7:30 AM to 4:00 PM Last  Updated: 08/19/2023 ______________________________________________________________________      ______________________________________________________________________    General Risks and Possible Complications  Patient Responsibilities: It is important that you read this as it is part of your informed consent. It is our duty to inform you of the risks and possible complications associated with treatments offered to you. It is your responsibility as a patient to read this and to ask questions about anything that is not clear or that you believe was not covered in this document.  Patient's Rights: You have the right to refuse treatment. You also have the right to change your mind, even after initially having agreed to have the treatment done. However, under this last option, if you wait until the last second to change your mind, you may be charged for the materials used up to that point.  Introduction: Medicine is not an Visual merchandiser. Everything in Medicine, including the lack of treatment(s), carries the potential for danger, harm, or loss (which is by definition: Risk). In Medicine, a complication is a secondary problem, condition, or disease that can aggravate an already existing one. All treatments carry the risk of possible complications. The fact that a side effects or complications occurs, does not imply that the treatment was conducted incorrectly. It must be clearly understood that these can happen even when everything is done following the highest safety standards.  No treatment: You can choose not to proceed with the proposed treatment alternative. The "PRO(s)" would include: avoiding the risk of complications associated with the therapy. The "CON(s)"  would include: not getting any of the treatment benefits. These benefits fall under one of three categories: diagnostic; therapeutic; and/or  palliative. Diagnostic benefits include: getting information which can ultimately lead to improvement of the disease or symptom(s). Therapeutic benefits are those associated with the successful treatment of the disease. Finally, palliative benefits are those related to the decrease of the primary symptoms, without necessarily curing the condition (example: decreasing the pain from a flare-up of a chronic condition, such as incurable terminal cancer).  General Risks and Complications: These are associated to most interventional treatments. They can occur alone, or in combination. They fall under one of the following six (6) categories: no benefit or worsening of symptoms; bleeding; infection; nerve damage; allergic reactions; and/or death. No benefits or worsening of symptoms: In Medicine there are no guarantees, only probabilities. No healthcare provider can ever guarantee that a medical treatment will work, they can only state the probability that it may. Furthermore, there is always the possibility that the condition may worsen, either directly, or indirectly, as a consequence of the treatment. Bleeding: This is more common if the patient is taking a blood thinner, either prescription or over the counter (example: Goody Powders, Fish oil, Aspirin, Garlic, etc.), or if suffering a condition associated with impaired coagulation (example: Hemophilia, cirrhosis of the liver, low platelet counts, etc.). However, even if you do not have one on these, it can still happen. If you have any of these conditions, or take one of these drugs, make sure to notify your treating physician. Infection: This is more common in patients with a compromised immune system, either due to disease (example: diabetes, cancer, human immunodeficiency virus [HIV], etc.), or due to medications or treatments (example: therapies used to treat cancer and rheumatological diseases). However, even if you do not have one on these, it can still  happen. If you have any of these conditions, or take one of these drugs, make sure to notify your treating physician. Nerve Damage: This is more common when the treatment is an invasive one, but it can also happen with the use of medications, such as those used in the treatment of cancer. The damage can occur to small secondary nerves, or to large primary ones, such as those in the spinal cord and brain. This damage may be temporary or permanent and it may lead to impairments that can range from temporary numbness to permanent paralysis and/or brain death. Allergic Reactions: Any time a substance or material comes in contact with our body, there is the possibility of an allergic reaction. These can range from a mild skin rash (contact dermatitis) to a severe systemic reaction (anaphylactic reaction), which can result in death. Death: In general, any medical intervention can result in death, most of the time due to an unforeseen complication. ______________________________________________________________________     ______________________________________________________________________    Appointment Information  It is our goal and responsibility to provide the medical community with assistance in the evaluation and management of patients with chronic pain. Unfortunately our resources are limited. Because we do not have an unlimited amount of time, or available appointments, we are required to closely monitor for unkept or cancelled appointments.  Patient's responsibilities: 1. Punctuality: Patients are required to be physically present in our office at least 15 minutes before their scheduled appointment. 2. Tardiness: Patients not physically present in our office at their scheduled appointment time will be rescheduled. 3. Plan ahead: Assume that you will encounter traffic and plan to arrive 30 minutes before your  appointment. 4. Other appointments and responsibilities: Do not schedule other  appointments immediately before or after your scheduled appointment.  5. Be prepared: Make a list of everything that you need to discuss with your provider so that you use your time efficiently. Once the provider leaves your room, he/she will not return to your room to discuss anything that you neglected to bring up during your allowed time. 6. No children or pets: Do not bring children or pets to your appointment. 7. Cancelling or rescheduling your appointment: Advanced notification (more than 24 hours in advance) is required. 8. No Show: Not calling to cancel an appointment and simply not showing up is unacceptable. This leads to loss of appointments that could have been used by a patient in need. (See below)  Corrective process for repeat offenders:  No Shows: Three (3) No Shows within a 12 month period will result in an automatic discharge from our practice. Rescheduling or cancelling with more than 24 hours notice will not be penalized and will not count against you. Tardiness: If you have to be rescheduled three (3) times due to late arrivals, it will be counted as one (1) No Show. Cancellation or reschedule: Three (3) cancellations or rescheduling where notice was given with less than 24 hours in advance, will be recorded as one (1) No Show.  Types of appointments: New patient initial evaluation: These are evaluations only. Your initial patient questionnaire will be collected and entered into the system. A history of present illness will be taken. Prior lab work, imaging studies, and associated treatments will be reviewed. The provider may order appropriate diagnostic testing depending on their evaluation and review of available information. No treatments will be started on this visit. 2nd Follow-up visit: During this visit your provider will inform you of the results of the diagnostic tests ordered on the initial evaluation. Based on the providers assessment, treatment options will be  offered, at which the patient will decide if he/she is interested in the alternatives. If interested, a treatment plan will be established and started. Procedure visits: Post-procedure evaluation visits: Evaluation visits MM New problems Flare-up evaluations Follow-up after diagnostic testing ______________________________________________________________________

## 2024-04-05 NOTE — Progress Notes (Signed)
 Safety precautions to be maintained throughout the outpatient stay will include: orient to surroundings, keep bed in low position, maintain call bell within reach at all times, provide assistance with transfer out of bed and ambulation.

## 2024-04-07 ENCOUNTER — Other Ambulatory Visit: Payer: Self-pay | Admitting: Internal Medicine

## 2024-04-07 DIAGNOSIS — E782 Mixed hyperlipidemia: Secondary | ICD-10-CM

## 2024-04-07 DIAGNOSIS — F32A Depression, unspecified: Secondary | ICD-10-CM

## 2024-04-09 ENCOUNTER — Ambulatory Visit: Admitting: Internal Medicine

## 2024-04-13 ENCOUNTER — Encounter: Payer: Self-pay | Admitting: Pain Medicine

## 2024-04-13 ENCOUNTER — Ambulatory Visit
Admission: RE | Admit: 2024-04-13 | Discharge: 2024-04-13 | Disposition: A | Discharge status: Home / Self Care | Source: Ambulatory Visit | Attending: Pain Medicine | Admitting: Pain Medicine

## 2024-04-13 ENCOUNTER — Ambulatory Visit (HOSPITAL_BASED_OUTPATIENT_CLINIC_OR_DEPARTMENT_OTHER): Admitting: Pain Medicine

## 2024-04-13 VITALS — BP 125/91 | HR 62 | Temp 97.6°F | Resp 14 | Ht 71.0 in | Wt 224.0 lb

## 2024-04-13 DIAGNOSIS — G4486 Cervicogenic headache: Secondary | ICD-10-CM

## 2024-04-13 DIAGNOSIS — M4310 Spondylolisthesis, site unspecified: Secondary | ICD-10-CM | POA: Diagnosis present

## 2024-04-13 DIAGNOSIS — R937 Abnormal findings on diagnostic imaging of other parts of musculoskeletal system: Secondary | ICD-10-CM | POA: Insufficient documentation

## 2024-04-13 DIAGNOSIS — M542 Cervicalgia: Secondary | ICD-10-CM | POA: Diagnosis present

## 2024-04-13 DIAGNOSIS — G8929 Other chronic pain: Secondary | ICD-10-CM

## 2024-04-13 DIAGNOSIS — M47812 Spondylosis without myelopathy or radiculopathy, cervical region: Secondary | ICD-10-CM

## 2024-04-13 MED ORDER — LIDOCAINE HCL 2 % IJ SOLN
20.0000 mL | Freq: Once | INTRAMUSCULAR | Status: AC
  Administered 2024-04-13: 400 mg

## 2024-04-13 MED ORDER — ROPIVACAINE HCL 2 MG/ML IJ SOLN
18.0000 mL | Freq: Once | INTRAMUSCULAR | Status: AC
  Administered 2024-04-13: 18 mL via PERINEURAL

## 2024-04-13 MED ORDER — DEXAMETHASONE SODIUM PHOSPHATE 10 MG/ML IJ SOLN
20.0000 mg | Freq: Once | INTRAMUSCULAR | Status: AC
  Administered 2024-04-13: 20 mg

## 2024-04-13 MED ORDER — MIDAZOLAM HCL 5 MG/5ML IJ SOLN
0.5000 mg | Freq: Once | INTRAMUSCULAR | Status: AC
  Administered 2024-04-13 (×2): 1 mg via INTRAVENOUS

## 2024-04-13 MED ORDER — PENTAFLUOROPROP-TETRAFLUOROETH EX AERO
INHALATION_SPRAY | Freq: Once | CUTANEOUS | Status: AC
  Administered 2024-04-13: 30 via TOPICAL

## 2024-04-13 MED ORDER — FENTANYL CITRATE (PF) 100 MCG/2ML IJ SOLN
INTRAMUSCULAR | Status: AC
  Filled 2024-04-13: qty 2

## 2024-04-13 MED ORDER — DEXAMETHASONE SODIUM PHOSPHATE 10 MG/ML IJ SOLN
INTRAMUSCULAR | Status: AC
  Filled 2024-04-13: qty 2

## 2024-04-13 MED ORDER — MIDAZOLAM HCL 5 MG/5ML IJ SOLN
INTRAMUSCULAR | Status: AC
  Filled 2024-04-13: qty 5

## 2024-04-13 MED ORDER — FENTANYL CITRATE (PF) 100 MCG/2ML IJ SOLN
25.0000 ug | INTRAMUSCULAR | Status: DC | PRN
  Administered 2024-04-13: 50 ug via INTRAVENOUS

## 2024-04-13 NOTE — Patient Instructions (Signed)

## 2024-04-13 NOTE — Progress Notes (Signed)
 PROVIDER NOTE: Interpretation of information contained herein should be left to medically-trained personnel. Specific patient instructions are provided elsewhere under Patient Instructions section of medical record. This document was created in part using STT-dictation technology, any transcriptional errors that may result from this process are unintentional.  Patient: Micheal Gay Type: Established DOB: 05-03-1966 MRN: [id number] PCP: [name], MD  Service: Procedure DOS: 04/13/2024 Setting: Ambulatory Location: Ambulatory outpatient facility Delivery: Face-to-face Provider: [name], MD Specialty: Interventional Pain Management Specialty designation: 09 Location: Outpatient facility Ref. Prov.: [name], MD       Interventional Therapy   Procedure: Cervical Facet Medial Branch Block(s) #1  Laterality: Bilateral  Level: TON, C3, C4, C5, and C6 Medial Branch Level(s). Injecting these levels blocks the C2-3, C3-4, C4-5, and C5-6 cervical facet joints.  Imaging: Fluoroscopic guidance Anesthesia: Local anesthesia (1-2% Lidocaine ) Anxiolysis: IV Versed  2.0 mg Sedation: Minimal Sedation Fentanyl  1 mL (50 mcg) DOS: 04/13/2024  Performed by: [name], MD  Purpose: Diagnostic/Therapeutic Indications: Cervicalgia (cervical spine axial pain) severe enough to impact quality of life or function. 1. Cervicalgia   2. Cervical Grade 1 Retrolisthesis of C3/C4 (Stable)   3. Cervical facet joint pain   4. Cervical facet syndrome   5. Cervical facet joint arthropathy   6. Cervical facet hypertrophy (Multilevel) (Bilateral)   7. Cervicogenic headache (2ry area of Pain)   8. Chronic neck pain (1ry area of Pain) (Midline) (Bilateral)   9. Spondylosis without myelopathy or radiculopathy, cervical region   10. Abnormal MRI, cervical spine (07/23/2023)    NAS-11 Pain score:   Pre-procedure: 9 /10   Post-procedure: 0-No pain/10     Position / Prep /  Materials:  Position: Prone. Head in cradle. C-spine slightly flexed. Prep solution: ChloraPrep (2% chlorhexidine gluconate and 70% isopropyl alcohol) Prep Area: Posterior Cervico-thoracic Region. From occipital ridge to tip of scapula, and from shoulder to shoulder. Entire posterior and lateral neck surface. Materials:  Tray: Block Needle(s):  Type: Spinal  Gauge (G): 22  Length: 3.5-in  Qty:  5     H&P (Pre-op Assessment):  Micheal Gay is a 58 y.o. (year old), male patient, seen today for interventional treatment. He  has a past surgical history that includes Eye surgery. Micheal Gay has a current medication list which includes the following prescription(s): amitriptyline , atorvastatin , clonazepam, empagliflozin , escitalopram , kerendia , gabapentin, indomethacin, meloxicam, metformin, nortriptyline, nystatin , ondansetron , pantoprazole , pioglitazone , nurtec, sildenafil, tamsulosin , and zavzpret , and the following Facility-Administered Medications: fentanyl . His primarily concern today is the Neck Pain  Initial Vital Signs:  Pulse/HCG Rate: 66ECG Heart Rate: 60 Temp: (!) 97.3 F (36.3 C) Resp: 20 BP: 132/79 SpO2: 100 %  BMI: Estimated body mass index is 31.24 kg/m as calculated from the following:   Height as of this encounter: 5' 11 (1.803 m).   Weight as of this encounter: 224 lb (101.6 kg).  Risk Assessment: Allergies: Reviewed. He is allergic to other and pollen extract.  Allergy Precautions: None required Coagulopathies: Reviewed. None identified.  Blood-thinner therapy: None at this time Active Infection(s): Reviewed. None identified. Micheal Gay is afebrile  Site Confirmation: Micheal Gay was asked to confirm the procedure and laterality before marking the site Procedure checklist: Completed Consent: Before the procedure and under the influence of no sedative(s), amnesic(s), or anxiolytics, the patient was informed of the treatment options, risks and possible  complications. To fulfill our ethical and legal obligations, as recommended by the American Medical Association's Code of Ethics, I have informed  the patient of my clinical impression; the nature and purpose of the treatment or procedure; the risks, benefits, and possible complications of the intervention; the alternatives, including doing nothing; the risk(s) and benefit(s) of the alternative treatment(s) or procedure(s); and the risk(s) and benefit(s) of doing nothing. The patient was provided information about the general risks and possible complications associated with the procedure. These may include, but are not limited to: failure to achieve desired goals, infection, bleeding, organ or nerve damage, allergic reactions, paralysis, and death. In addition, the patient was informed of those risks and complications associated to Spine-related procedures, such as failure to decrease pain; infection (i.e.: Meningitis, epidural or intraspinal abscess); bleeding (i.e.: epidural hematoma, subarachnoid hemorrhage, or any other type of intraspinal or peri-dural bleeding); organ or nerve damage (i.e.: Any type of peripheral nerve, nerve root, or spinal cord injury) with subsequent damage to sensory, motor, and/or autonomic systems, resulting in permanent pain, numbness, and/or weakness of one or several areas of the body; allergic reactions; (i.e.: anaphylactic reaction); and/or death. Furthermore, the patient was informed of those risks and complications associated with the medications. These include, but are not limited to: allergic reactions (i.e.: anaphylactic or anaphylactoid reaction(s)); adrenal axis suppression; blood sugar elevation that in diabetics may result in ketoacidosis or comma; water retention that in patients with history of congestive heart failure may result in shortness of breath, pulmonary edema, and decompensation with resultant heart failure; weight gain; swelling or edema; medication-induced  neural toxicity; particulate matter embolism and blood vessel occlusion with resultant organ, and/or nervous system infarction; and/or aseptic necrosis of one or more joints. Finally, the patient was informed that Medicine is not an exact science; therefore, there is also the possibility of unforeseen or unpredictable risks and/or possible complications that may result in a catastrophic outcome. The patient indicated having understood very clearly. We have given the patient no guarantees and we have made no promises. Enough time was given to the patient to ask questions, all of which were answered to the patient's satisfaction. Mr. Blagg has indicated that he wanted to continue with the procedure. Attestation: I, the ordering provider, attest that I have discussed with the patient the benefits, risks, side-effects, alternatives, likelihood of achieving goals, and potential problems during recovery for the procedure that I have provided informed consent. Date  Time: 04/13/2024  8:46 AM  Pre-Procedure Preparation:  Monitoring: As per clinic protocol. Respiration, ETCO2, SpO2, BP, heart rate and rhythm monitor placed and checked for adequate function Safety Precautions: Patient was assessed for positional comfort and pressure points before starting the procedure. Time-out: I initiated and conducted the Time-out before starting the procedure, as per protocol. The patient was asked to participate by confirming the accuracy of the Time Out information. Verification of the correct person, site, and procedure were performed and confirmed by me, the nursing staff, and the patient. Time-out conducted as per Joint Commission's Universal Protocol (UP.01.01.01). Time: 0938 Start Time: 0938 hrs.  Description/Narrative of Procedure:          Laterality: See above. Targeted Levels: See above.  Rationale (medical necessity): procedure needed and proper for the diagnosis and/or treatment of the patient's  medical symptoms and needs. Procedural Technique Safety Precautions: Aspiration looking for blood return was conducted prior to all injections. At no point did we inject any substances, as a needle was being advanced. No attempts were made at seeking any paresthesias. Safe injection practices and needle disposal techniques used. Medications properly checked for expiration dates. SDV (single  dose vial) medications used. Description of the Procedure: Protocol guidelines were followed. The patient was assisted into a comfortable position. The target area was identified and the area prepped in the usual manner. Skin & deeper tissues infiltrated with local anesthetic. Appropriate amount of time allowed to pass for local anesthetics to take effect. The procedure needles were then advanced to the target area. Proper needle placement secured. Negative aspiration confirmed. Solution injected in intermittent fashion, asking for systemic symptoms every 0.5cc of injectate. The needles were then removed and the area cleansed, making sure to leave some of the prepping solution back to take advantage of its long term bactericidal properties.  Technical description of process:  C2 Medial Branch Nerve Block (MBB): The target area for the C2 dorsal medial articular branch is the lateral concave waist of the articular pillar of C2. Under fluoroscopic guidance, a Quincke needle was inserted until contact was made with os over the postero-lateral aspect of the articular pillar of C2 (target area). After negative aspiration for blood, 0.5 mL of the nerve block solution was injected without difficulty or complication. The needle was removed intact. Third Occipital Nerve (TON) Block (MBB): The target area for the TON branch is the postero-lateral aspect of the C2-C3 articulation. Under fluoroscopic guidance, a Quincke needle was inserted until contact was made with os over the target area. After negative aspiration for blood, 0.5 mL  of the nerve block solution was injected without difficulty or complication. The needle was removed intact. C3 Medial Branch Nerve Block (MBB): The target area for the C3 dorsal medial articular branch is the lateral concave waist of the articular pillar of C3. Under fluoroscopic guidance, a Quincke needle was inserted until contact was made with os over the postero-lateral aspect of the articular pillar of C3 (target area). After negative aspiration for blood, 0.5 mL of the nerve block solution was injected without difficulty or complication. The needle was removed intact. C4 Medial Branch Nerve Block (MBB): The target area for the C4 dorsal medial articular branch is the lateral concave waist of the articular pillar of C4. Under fluoroscopic guidance, a Quincke needle was inserted until contact was made with os over the postero-lateral aspect of the articular pillar of C4 (target area). After negative aspiration for blood, 0.5 mL of the nerve block solution was injected without difficulty or complication. The needle was removed intact. C5 Medial Branch Nerve Block (MBB): The target area for the C5 dorsal medial articular branch is the lateral concave waist of the articular pillar of C5. Under fluoroscopic guidance, a Quincke needle was inserted until contact was made with os over the postero-lateral aspect of the articular pillar of C5 (target area). After negative aspiration for blood, 0.5 mL of the nerve block solution was injected without difficulty or complication. The needle was removed intact. C6 Medial Branch Nerve Block (MBB): The target area for the C6 dorsal medial articular branch is the lateral concave waist of the articular pillar of C6. Under fluoroscopic guidance, a Quincke needle was inserted until contact was made with os over the postero-lateral aspect of the articular pillar of C6 (target area). After negative aspiration for blood, 0.5 mL of the nerve block solution was injected without  difficulty or complication. The needle was removed intact. C7 Medial Branch Nerve Block (MBB): The target for the C7 dorsal medial articular branch lies on the superior-lateral tip of the C7 transverse process. Under fluoroscopic guidance, a Quincke needle was inserted until contact was made with  os over the postero-lateral aspect of the articular pillar of C7 (target area). After negative aspiration for blood, 0.5 mL of the nerve block solution was injected without difficulty or complication. The needle was removed intact. C8 Medial Branch Nerve Block (MBB): The target for the C8 dorsal medial articular branch lies on the superior-lateral aspect of the T1 transverse process. Under fluoroscopic guidance, a Quincke needle was inserted until contact was made with os over the postero-lateral aspect of the articular pillar of T1 (target area). After negative aspiration for blood, 0.5 mL of the nerve block solution was injected without difficulty or complication. The needle was removed intact.  Once the entire procedure was completed, the treated area was cleaned, making sure to leave some of the prepping solution back to take advantage of its long term bactericidal properties.  Anatomy Reference Guide:      Facet Joint Innervation  C1-2 Third occipital Nerve (TON)  C2-3 TON, C3  Medial Branch  C3-4 C3, C4                     C4-5 C4, C5                     C5-6 C5, C6                     C6-7 C6, C7                     C7-T1 C7, C8                      Cervical Facet Pain Pattern overlap:   Vitals:   04/13/24 0952 04/13/24 0955 04/13/24 1005 04/13/24 1012  BP:  (!) 125/97 (!) 147/95 (!) 125/91  Pulse:      Resp: 20 11 13 14   Temp:  97.7 F (36.5 C)  97.6 F (36.4 C)  SpO2: 100% 100% 100% 100%  Weight:      Height:         Start Time: 0938 hrs. End Time: 0951 hrs.  Imaging Guidance (Spinal):         Type of Imaging Technique: Fluoroscopy Guidance (Spinal) Indication(s):  Fluoroscopy guidance for needle placement to enhance accuracy in procedures requiring precise needle localization for targeted delivery of medication in or near specific anatomical locations not easily accessible without such real-time imaging assistance. Exposure Time: Please see nurses notes. Contrast: None used. Fluoroscopic Guidance: I was personally present during the use of fluoroscopy. Tunnel Vision Technique used to obtain the best possible view of the target area. Parallax error corrected before commencing the procedure. Direction-depth-direction technique used to introduce the needle under continuous pulsed fluoroscopy. Once target was reached, antero-posterior, oblique, and lateral fluoroscopic projection used confirm needle placement in all planes. Images permanently stored in EMR. Interpretation: No contrast injected. I personally interpreted the imaging intraoperatively. Adequate needle placement confirmed in multiple planes. Permanent images saved into the patient's record.  Post-operative Assessment:  Post-procedure Vital Signs:  Pulse/HCG Rate: 6263 Temp: 97.6 F (36.4 C) Resp: 14 BP: (!) 125/91 SpO2: 100 %  EBL: None  Complications: No immediate post-treatment complications observed by team, or reported by patient.  Note: The patient tolerated the entire procedure well. A repeat set of vitals were taken after the procedure and the patient was kept under observation following institutional policy, for this type of procedure. Post-procedural neurological assessment was performed, showing return to baseline, prior  to discharge. The patient was provided with post-procedure discharge instructions, including a section on how to identify potential problems. Should any problems arise concerning this procedure, the patient was given instructions to immediately contact us , at any time, without hesitation. In any case, we plan to contact the patient by telephone for a follow-up status  report regarding this interventional procedure.  Comments:  No additional relevant information.  Plan of Care (POC)  Orders:  Orders Placed This Encounter  Procedures   CERVICAL FACET (MEDIAL BRANCH NERVE BLOCK)     Scheduling Instructions:     Side: Bilateral     Level: C3-4, C4-5, C5-6, and TBD Facet joints (TON, C3, C4, C5, C6, and TBD Medial Branch Nerves)     Sedation: Patient's choice.     Date: 04/13/2024    Where will this procedure be performed?:   [facility]   [facility] C-ARM 1-60 MIN NO REPORT    Intraoperative interpretation by procedural physician at [facility].    Standing Status:   Standing    Number of Occurrences:   1    Reason for exam::   Assistance in needle guidance and placement for procedures requiring needle placement in or near specific anatomical locations not easily accessible without such assistance.   Informed Consent Details: Physician/Practitioner Attestation; Transcribe to consent form and obtain patient signature    Note: Always confirm laterality of pain with Mr. Friesen, before procedure. Transcribe to consent form and obtain patient signature.    Physician/Practitioner attestation of informed consent for procedure/surgical case:   I, the physician/practitioner, attest that I have discussed with the patient the benefits, risks, side effects, alternatives, likelihood of achieving goals and potential problems during recovery for the procedure that I have provided informed consent.    Procedure:   Bilateral Cervical facet block under fluoroscopic guidance.    Physician/Practitioner performing the procedure:   Aryeh Butterfield A. Tanya, MD    Indication/Reason:   Chronic neck pain secondary to cervical facet syndrome   Provide equipment / supplies at bedside    Procedure tray: Block Tray (Disposable  single use) Skin infiltration needle: Regular 1.5-in, 25-G, (x1) Block Needle type: Spinal Amount/quantity: 5 Size: Regular  (3.5-inch) Gauge: 22G    Standing Status:   Standing    Number of Occurrences:   1    Specify:   Block Tray   Saline lock IV    Have LR [phone] mL available and administer at 125 mL/hr if patient becomes hypotensive.    Standing Status:   Standing    Number of Occurrences:   1     Analgesic: No chronic opioid analgesics therapy prescribed by our practice. None. MME/day: 0 mg/day    Medications ordered for procedure: Meds ordered this encounter  Medications   lidocaine  (XYLOCAINE ) 2 % (with pres) injection 400 mg   pentafluoroprop-tetrafluoroeth (GEBAUERS) aerosol   midazolam  (VERSED ) 5 MG/5ML injection 0.5-2 mg    Make sure Flumazenil is available in the pyxis when using this medication. If oversedation occurs, administer 0.2 mg IV over 15 sec. If after 45 sec no response, administer 0.2 mg again over 1 min; may repeat at 1 min intervals; not to exceed 4 doses (1 mg)   fentaNYL  (SUBLIMAZE ) injection 25-50 mcg    Make sure Narcan is available in the pyxis when using this medication. In the event of respiratory depression (RR< 8/min): Titrate NARCAN (naloxone) in increments of 0.1 to 0.2 mg IV at 2-3 minute intervals,  until desired degree of reversal.   ropivacaine  (PF) 2 mg/mL (0.2%) (NAROPIN ) injection 18 mL   dexamethasone  (DECADRON ) injection 20 mg   Medications administered: We administered lidocaine , pentafluoroprop-tetrafluoroeth, midazolam , fentaNYL , ropivacaine  (PF) 2 mg/mL (0.2%), and dexamethasone .  See the medical record for exact dosing, route, and time of administration.    Interventional Therapies  Risk Factors  Considerations  Medical Comorbidities:  BPH  HTN  GERD  Hx. BNZ use  T2NIDDM  Bipolar     Planned  Pending:   Diagnostic bilateral cervical facet MBB #1    Under consideration:   Diagnostic bilateral cervical facet MBB #1 w/ possible follow-up RFA.    Completed:   Diagnostic midline cervical ESI x2 (03/18/2024) (1st:100/100/80 x 1 week/0)  (2nd:100/100/85/80)  Referral to physical therapy entered (10/29/2023)  Missed appointments: (11/11/2023-procedure, 02/04/2024-eval, 03/11/2024-procedure) (12/09/2023-virtual)   Therapeutic  Palliative (PRN) options:   None established   Completed by other providers:   None reported       Follow-up plan:   Return in about 2 weeks (around 04/27/2024) for Eval-day (M,W), (Face2F), (PPE).     Recent Visits Date Type Provider Dept  04/05/24 Office Visit Tanya Glisson, MD [facility]  03/18/24 Procedure visit Tanya Glisson, MD [facility]  03/08/24 Office Visit Tanya Glisson, MD [facility]  02/04/24 Office Visit Tanya Glisson, MD [facility]  Showing recent visits within past 90 days and meeting all other requirements Today's Visits Date Type Provider Dept  04/13/24 Procedure visit Tanya Glisson, MD [facility]  Showing today's visits and meeting all other requirements Future Appointments Date Type Provider Dept  04/28/24 Appointment Tanya Glisson, MD [facility]  Showing future appointments within next 90 days and meeting all other requirements   Disposition: Discharge home  Discharge (Date  Time): 04/13/2024; 1015 hrs.   Primary Care Physician: [name], MD Location: [facility] Note by: Glisson DELENA Tanya, MD (TTS technology used. I apologize for any typographical errors that were not detected and corrected.) Date: 04/13/2024; Time: 10:29 AM  Disclaimer:  Medicine is not an Visual merchandiser. The only guarantee in medicine is that nothing is guaranteed. It is important to note that the decision to proceed with this intervention was based on the information collected from the patient. The Data and conclusions were drawn from the patient's questionnaire, the interview, and the physical examination. Because the information was provided in large part by the  patient, it cannot be guaranteed that it has not been purposely or unconsciously manipulated. Every effort has been made to obtain as much relevant data as possible for this evaluation. It is important to note that the conclusions that lead to this procedure are derived in large part from the available data. Always take into account that the treatment will also be dependent on availability of resources and existing treatment guidelines, considered by other Pain Management Practitioners as being common knowledge and practice, at the time of the intervention. For Medico-Legal purposes, it is also important to point out that variation in procedural techniques and pharmacological choices are the acceptable norm. The indications, contraindications, technique, and results of the above procedure should only be interpreted and judged by a Board-Certified Interventional Pain Specialist with extensive familiarity and expertise in the same exact procedure and technique.

## 2024-04-13 NOTE — Progress Notes (Signed)
 Safety precautions to be maintained throughout the outpatient stay will include: orient to surroundings, keep bed in low position, maintain call bell within reach at all times, provide assistance with transfer out of bed and ambulation.

## 2024-04-14 ENCOUNTER — Telehealth: Payer: Self-pay

## 2024-04-14 NOTE — Telephone Encounter (Signed)
 Post procedure follow up.  Patient states he is doing fine

## 2024-04-20 ENCOUNTER — Telehealth: Payer: Self-pay

## 2024-04-20 NOTE — Telephone Encounter (Signed)
 Patients spouse has called stating that whatever medication the patient is on he is urinating in the bed daily now please advise what to do?\

## 2024-04-24 ENCOUNTER — Other Ambulatory Visit: Payer: Self-pay | Admitting: Internal Medicine

## 2024-04-24 DIAGNOSIS — F32A Depression, unspecified: Secondary | ICD-10-CM

## 2024-04-27 NOTE — Progress Notes (Unsigned)
 PROVIDER NOTE: Interpretation of information contained herein should be left to medically-trained personnel. Specific patient instructions are provided elsewhere under Patient Instructions section of medical record. This document was created in part using AI and STT-dictation technology, any transcriptional errors that may result from this process are unintentional.  Patient: Micheal Gay Pouch  Service: E/M   PCP: [name], MD  DOB: 13-Jun-1966  DOS: 04/28/2024  Provider: Eric Gay Como, MD  MRN: [id number]  Delivery: Face-to-face  Specialty: Interventional Pain Management  Type: Established Patient  Setting: Ambulatory outpatient facility  Specialty designation: 09  Referring Prov.: [name], MD  Location: Outpatient office facility       History of present illness (HPI) Micheal Gay, a 58 y.o. year old male, is here today because of his Cervicalgia [M54.2]. [name] primary complain today is No chief complaint on file.  Pertinent problems: Mr. 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  Pain Assessment: Severity of   is reported as a  /10. Location:    / . Onset:  . Quality:  . Timing:   . Modifying factor(s):  SABRA Vitals:  vitals were not taken for this visit.  BMI: Estimated body mass index is 31.24 kg/m as calculated from the following:   Height as of 04/13/24: 5' 11 (1.803 m).   Weight as of 04/13/24: 224 lb (101.6 kg).  Last encounter: 04/05/2024. Last procedure: 04/13/2024.  Reason for encounter: post-procedure evaluation and assessment.   Discussed the use of AI scribe software for clinical note transcription with the patient, who gave verbal consent to proceed.  History of Present Illness          Post-Procedure Evaluation   Procedure: Cervical Facet Medial Branch Block(s) #1  Laterality: Bilateral  Level: TON, C3, C4, C5, and C6 Medial Branch Level(s). Injecting these levels blocks the C2-3, C3-4, C4-5, and C5-6 cervical facet joints.  Imaging: Fluoroscopic guidance Anesthesia: Local anesthesia (1-2% Lidocaine ) Anxiolysis: IV Versed  2.0 mg Sedation: Minimal Sedation Fentanyl  1 mL (50 mcg) DOS: 04/13/2024  Performed by: Eric Gay Como, MD  Purpose: Diagnostic/Therapeutic Indications: Cervicalgia (cervical spine axial pain) severe enough to impact quality of life or function. 1. Cervicalgia   2. Cervical Grade 1 Retrolisthesis of C3/C4 (Stable)   3. Cervical facet joint pain   4. Cervical facet syndrome   5. Cervical facet joint arthropathy   6. Cervical facet hypertrophy (Multilevel) (Bilateral)   7. Cervicogenic headache (2ry area of Pain)   8. Chronic neck pain (1ry area of Pain) (Midline) (Bilateral)   9. Spondylosis without myelopathy or radiculopathy, cervical region   10. Abnormal MRI, cervical spine (07/23/2023)    NAS-11 Pain score:   Pre-procedure: 9 /10   Post-procedure: 0-No pain/10     Effectiveness:  Initial hour after procedure:   ***. Subsequent 4-6 hours post-procedure:   ***. Analgesia past initial 6 hours:   ***.  Ongoing improvement:  Analgesic:  *** Function:    ***    ROM:    ***     Pharmacotherapy Assessment    Analgesic: No chronic opioid analgesics therapy prescribed by our practice. None. MME/day: 0 mg/day   Monitoring: [place] PMP: PDMP reviewed during this encounter.       Pharmacotherapy: No side-effects or adverse reactions reported. Compliance: No problems identified. Effectiveness: Clinically acceptable.  No notes on file  UDS:  Summary  Date Value Ref Range Status  10/08/2023 FINAL  Final    Comment:    ==================================================================== Compliance Drug Analysis, Ur ==================================================================== Test                             Result       Flag       Units  Drug Present and Declared for Prescription Verification   7-aminoclonazepam              161          EXPECTED   ng/mg creat    7-aminoclonazepam is an expected metabolite of clonazepam. Source of    clonazepam is a scheduled prescription medication.    Gabapentin                     PRESENT      EXPECTED  Drug Absent but Declared for Prescription Verification   Amitriptyline                   Not Detected UNEXPECTED   Nortriptyline                  Not Detected UNEXPECTED   Promethazine                    Not Detected UNEXPECTED ==================================================================== Test                      Result    Flag   Units      Ref Range   Creatinine              49               mg/dL      >=79 ==================================================================== Declared Medications:  The flagging and interpretation on this report are based on the  following declared medications.  Unexpected results may arise from  inaccuracies in the declared medications.   **Note: The testing scope of this panel includes these medications:   Amitriptyline  (Elavil )  Clonazepam (Klonopin)  Gabapentin  Nortriptyline (Pamelor)  Promethazine  (Phenergan )   **Note: The testing scope of this panel does not include the  following reported  medications:   Atorvastatin  (Lipitor)  Empagliflozin  (Jardiance )  Indomethacin (Indocin)  Meloxicam (Mobic)  Metformin  Ondansetron  (Zofran )  Pantoprazole  (Protonix )  Pioglitazone  (Actos )  Rimegepant (Nurtec)  Sildenafil (Viagra)  Zavegepant (Zavzpret ) ==================================================================== For clinical consultation, please call [phone]. ====================================================================     No results found for: CBDTHCR No results found for: D8THCCBX No results found for: D9THCCBX  ROS  Constitutional: Denies any fever or chills Gastrointestinal: No reported hemesis, hematochezia, vomiting, or acute GI distress Musculoskeletal: Denies any acute onset joint swelling, redness, loss of ROM, or weakness Neurological: No reported episodes of acute onset apraxia, aphasia, dysarthria, agnosia, amnesia, paralysis, loss of coordination, or loss of consciousness  Medication Review  Rimegepant Sulfate, Zavegepant HCl, amitriptyline , atorvastatin , clonazePAM, empagliflozin , escitalopram , gabapentin, indomethacin, meloxicam, metFORMIN,  nortriptyline, nystatin , ondansetron , pantoprazole , pioglitazone , sildenafil, and tamsulosin   History Review  Allergy: Mr. Hattabaugh is allergic to other and pollen extract. Drug: Mr. Marian  reports no history of drug use. Alcohol:  reports no history of alcohol use. Tobacco:  reports that he has never smoked. He quit smokeless tobacco use about 41 years ago.  His smokeless tobacco use included chew. Social: Mr. Schifano  reports that he has never smoked. He quit smokeless tobacco use about 41 years ago.  His smokeless tobacco use included chew. He reports that he does not drink alcohol and does not use drugs. Medical:  has a past medical history of Bipolar 1 disorder (HCC), Depression, Diabetes mellitus without complication (HCC), Keratoconus of right eye, Sepsis (HCC) (09/22/2016), and  Septicemia (HCC) (09/22/2016). Surgical: Mr. Copen  has a past surgical history that includes Eye surgery. Family: family history includes Breast cancer in his mother; Diabetes in his mother; Heart attack in his father.  Laboratory Chemistry Profile   Renal Lab Results  Component Value Date   BUN 31 (H) 02/19/2024   CREATININE 1.18 02/19/2024   BCR 26 (H) 02/19/2024   GFRAA >60 01/18/2017   GFRNONAA >60 12/21/2022    Hepatic Lab Results  Component Value Date   AST 65 (H) 02/19/2024   ALT 48 (H) 02/19/2024   ALBUMIN 4.5 02/19/2024   ALKPHOS 69 02/19/2024    Electrolytes Lab Results  Component Value Date   NA 139 02/19/2024   K 4.2 02/19/2024   CL 99 02/19/2024   CALCIUM  9.2 02/19/2024   MG 2.0 10/08/2023    Bone Lab Results  Component Value Date   25OHVITD1 27 (L) 10/08/2023   25OHVITD2 <1.0 10/08/2023   25OHVITD3 26 10/08/2023    Inflammation (CRP: Acute Phase) (ESR: Chronic Phase) Lab Results  Component Value Date   CRP <1 10/08/2023   ESRSEDRATE 23 10/08/2023   LATICACIDVEN 1.5 09/22/2016         Note: Above Lab results reviewed.  Recent Imaging Review  DG PAIN CLINIC C-ARM 1-60 MIN NO REPORT Fluoro was used, but no Radiologist interpretation will be provided.  Please refer to NOTES tab for provider progress note. Note: Reviewed        Physical Exam  Vitals: There were no vitals taken for this visit. BMI: Estimated body mass index is 31.24 kg/m as calculated from the following:   Height as of 04/13/24: 5' 11 (1.803 m).   Weight as of 04/13/24: 224 lb (101.6 kg). Ideal: Ideal body weight: 75.3 kg (166 lb 0.1 oz) Adjusted ideal body weight: 85.8 kg (189 lb 3.3 oz) General appearance: Well nourished, well developed, and well hydrated. In no apparent acute distress Mental status: Alert, oriented x 3 (person, place, & time)       Respiratory: No evidence of acute respiratory distress Eyes: PERLA   Assessment   Diagnosis Status  1. Cervicalgia    2. Cervical facet joint pain   3. Cervical facet syndrome   4. Postop check    Controlled Controlled Controlled   Updated Problems: No problems updated.  Plan of Care  Problem-specific:  Assessment and Plan            Mr. REYANSH KUSHNIR has a current medication list which includes the following long-term medication(s): amitriptyline , atorvastatin , clonazepam, escitalopram , gabapentin, metformin, nortriptyline, pioglitazone , and sildenafil.  Pharmacotherapy (Medications Ordered): No orders of the defined types were placed in this encounter.  Orders:  No orders of the defined types were  placed in this encounter.    Interventional Therapies  Risk Factors  Considerations  Medical Comorbidities:  BPH  HTN  GERD  Hx. BNZ use  T2NIDDM  Bipolar     Planned  Pending:   Diagnostic bilateral cervical facet MBB #1    Under consideration:   Diagnostic bilateral cervical facet MBB #1 w/ possible follow-up RFA.    Completed:   Diagnostic midline cervical ESI x2 (03/18/2024) (1st:100/100/80 x 1 week/0) (2nd:100/100/85/80)  Referral to physical therapy entered (10/29/2023)  Missed appointments: (11/11/2023-procedure, 02/04/2024-eval, 03/11/2024-procedure) (12/09/2023-virtual)   Therapeutic  Palliative (PRN) options:   None established   Completed by other providers:   None reported      No follow-ups on file.    Recent Visits Date Type Provider Dept  04/13/24 Procedure visit Tanya Glisson, MD [facility]  04/05/24 Office Visit Tanya Glisson, MD [facility]  03/18/24 Procedure visit Tanya Glisson, MD [facility]  03/08/24 Office Visit Tanya Glisson, MD [facility]  02/04/24 Office Visit Tanya Glisson, MD [facility]  Showing recent visits within past 90 days and meeting all other requirements Future Appointments Date Type Provider Dept  04/28/24 Appointment Tanya Glisson, MD  [facility]  Showing future appointments within next 90 days and meeting all other requirements  I discussed the assessment and treatment plan with the patient. The patient was provided an opportunity to ask questions and all were answered. The patient agreed with the plan and demonstrated an understanding of the instructions.  Patient advised to call back or seek an in-person evaluation if the symptoms or condition worsens.  Duration of encounter: *** minutes.  Total time on encounter, as per AMA guidelines included both the face-to-face and non-face-to-face time personally spent by the physician and/or other qualified health care professional(s) on the day of the encounter (includes time in activities that require the physician or other qualified health care professional and does not include time in activities normally performed by clinical staff). Physician's time may include the following activities when performed: Preparing to see the patient (e.g., pre-charting review of records, searching for previously ordered imaging, lab work, and nerve conduction tests) Review of prior analgesic pharmacotherapies. Reviewing PMP Interpreting ordered tests (e.g., lab work, imaging, nerve conduction tests) Performing post-procedure evaluations, including interpretation of diagnostic procedures Obtaining and/or reviewing separately obtained history Performing a medically appropriate examination and/or evaluation Counseling and educating the patient/family/caregiver Ordering medications, tests, or procedures Referring and communicating with other health care professionals (when not separately reported) Documenting clinical information in the electronic or other health record Independently interpreting results (not separately reported) and communicating results to the patient/ family/caregiver Care coordination (not separately reported)  Note by: Glisson Gay Tanya, MD (TTS and AI technology  used. I apologize for any typographical errors that were not detected and corrected.) Date: 04/28/2024; Time: 7:25 AM

## 2024-04-28 ENCOUNTER — Encounter: Payer: Self-pay | Admitting: Pain Medicine

## 2024-04-28 ENCOUNTER — Ambulatory Visit: Attending: Pain Medicine | Admitting: Pain Medicine

## 2024-04-28 VITALS — BP 127/70 | HR 73 | Temp 97.0°F | Resp 16 | Ht 71.5 in | Wt 230.5 lb

## 2024-04-28 DIAGNOSIS — Z09 Encounter for follow-up examination after completed treatment for conditions other than malignant neoplasm: Secondary | ICD-10-CM | POA: Diagnosis present

## 2024-04-28 DIAGNOSIS — M47812 Spondylosis without myelopathy or radiculopathy, cervical region: Secondary | ICD-10-CM | POA: Diagnosis present

## 2024-04-28 DIAGNOSIS — M542 Cervicalgia: Secondary | ICD-10-CM | POA: Diagnosis present

## 2024-04-28 NOTE — Patient Instructions (Signed)

## 2024-04-28 NOTE — Progress Notes (Signed)
 Safety precautions to be maintained throughout the outpatient stay will include: orient to surroundings, keep bed in low position, maintain call bell within reach at all times, provide assistance with transfer out of bed and ambulation.

## 2024-05-07 ENCOUNTER — Other Ambulatory Visit: Payer: Self-pay | Admitting: Internal Medicine

## 2024-05-15 ENCOUNTER — Other Ambulatory Visit: Payer: Self-pay | Admitting: Internal Medicine

## 2024-05-15 DIAGNOSIS — F419 Anxiety disorder, unspecified: Secondary | ICD-10-CM

## 2024-05-15 DIAGNOSIS — N401 Enlarged prostate with lower urinary tract symptoms: Secondary | ICD-10-CM

## 2024-05-15 DIAGNOSIS — E782 Mixed hyperlipidemia: Secondary | ICD-10-CM

## 2024-05-21 DIAGNOSIS — K222 Esophageal obstruction: Secondary | ICD-10-CM | POA: Insufficient documentation

## 2024-05-21 DIAGNOSIS — N3944 Nocturnal enuresis: Secondary | ICD-10-CM | POA: Insufficient documentation

## 2024-05-24 DIAGNOSIS — R748 Abnormal levels of other serum enzymes: Secondary | ICD-10-CM | POA: Insufficient documentation

## 2024-05-24 DIAGNOSIS — R7989 Other specified abnormal findings of blood chemistry: Secondary | ICD-10-CM | POA: Insufficient documentation

## 2024-05-25 ENCOUNTER — Ambulatory Visit
Admission: RE | Admit: 2024-05-25 | Discharge: 2024-05-25 | Disposition: A | Discharge status: Home / Self Care | Source: Ambulatory Visit | Attending: Pain Medicine | Admitting: Pain Medicine

## 2024-05-25 ENCOUNTER — Ambulatory Visit (HOSPITAL_BASED_OUTPATIENT_CLINIC_OR_DEPARTMENT_OTHER): Admitting: Pain Medicine

## 2024-05-25 ENCOUNTER — Encounter: Payer: Self-pay | Admitting: Pain Medicine

## 2024-05-25 VITALS — BP 133/75 | HR 64 | Temp 97.3°F | Resp 17 | Ht 71.0 in | Wt 225.0 lb

## 2024-05-25 DIAGNOSIS — M542 Cervicalgia: Secondary | ICD-10-CM | POA: Insufficient documentation

## 2024-05-25 DIAGNOSIS — M25511 Pain in right shoulder: Secondary | ICD-10-CM | POA: Diagnosis present

## 2024-05-25 DIAGNOSIS — R937 Abnormal findings on diagnostic imaging of other parts of musculoskeletal system: Secondary | ICD-10-CM | POA: Diagnosis present

## 2024-05-25 DIAGNOSIS — M4310 Spondylolisthesis, site unspecified: Secondary | ICD-10-CM | POA: Insufficient documentation

## 2024-05-25 DIAGNOSIS — G8929 Other chronic pain: Secondary | ICD-10-CM

## 2024-05-25 DIAGNOSIS — M47812 Spondylosis without myelopathy or radiculopathy, cervical region: Secondary | ICD-10-CM | POA: Diagnosis present

## 2024-05-25 DIAGNOSIS — M25512 Pain in left shoulder: Secondary | ICD-10-CM | POA: Insufficient documentation

## 2024-05-25 MED ORDER — PENTAFLUOROPROP-TETRAFLUOROETH EX AERO
INHALATION_SPRAY | Freq: Once | CUTANEOUS | Status: AC
  Administered 2024-05-25: 30 via TOPICAL

## 2024-05-25 MED ORDER — LIDOCAINE HCL 2 % IJ SOLN
20.0000 mL | Freq: Once | INTRAMUSCULAR | Status: AC
  Administered 2024-05-25: 400 mg

## 2024-05-25 MED ORDER — ROPIVACAINE HCL 2 MG/ML IJ SOLN
18.0000 mL | Freq: Once | INTRAMUSCULAR | Status: AC
  Administered 2024-05-25: 18 mL via PERINEURAL

## 2024-05-25 MED ORDER — MIDAZOLAM HCL 5 MG/5ML IJ SOLN
0.5000 mg | Freq: Once | INTRAMUSCULAR | Status: AC
  Administered 2024-05-25: 2 mg via INTRAVENOUS

## 2024-05-25 MED ORDER — FENTANYL CITRATE (PF) 100 MCG/2ML IJ SOLN
INTRAMUSCULAR | Status: AC
  Filled 2024-05-25: qty 2

## 2024-05-25 MED ORDER — LIDOCAINE HCL 2 % IJ SOLN
INTRAMUSCULAR | Status: AC
  Filled 2024-05-25: qty 20

## 2024-05-25 MED ORDER — MIDAZOLAM HCL 5 MG/5ML IJ SOLN
INTRAMUSCULAR | Status: AC
  Filled 2024-05-25: qty 5

## 2024-05-25 MED ORDER — ROPIVACAINE HCL 2 MG/ML IJ SOLN
INTRAMUSCULAR | Status: AC
  Filled 2024-05-25: qty 20

## 2024-05-25 MED ORDER — DEXAMETHASONE SODIUM PHOSPHATE 10 MG/ML IJ SOLN
20.0000 mg | Freq: Once | INTRAMUSCULAR | Status: AC
  Administered 2024-05-25: 20 mg

## 2024-05-25 MED ORDER — DEXAMETHASONE SODIUM PHOSPHATE 10 MG/ML IJ SOLN
INTRAMUSCULAR | Status: AC
  Filled 2024-05-25: qty 2

## 2024-05-25 MED ORDER — FENTANYL CITRATE (PF) 100 MCG/2ML IJ SOLN: 25.0000 ug | INTRAMUSCULAR | Status: DC | PRN

## 2024-05-25 NOTE — Patient Instructions (Signed)

## 2024-05-25 NOTE — Progress Notes (Addendum)
 PROVIDER NOTE: Interpretation of information contained herein should be left to medically-trained personnel. Specific patient instructions are provided elsewhere under Patient Instructions section of medical record. This document was created in part using STT-dictation technology, any transcriptional errors that may result from this process are unintentional.  Patient: Micheal Gay Type: Established DOB: 12-19-1965 MRN: [id number] PCP: [name], MD  Service: Procedure DOS: 05/25/2024 Setting: Ambulatory Location: Ambulatory outpatient facility Delivery: Face-to-face Provider: [name], MD Specialty: Interventional Pain Management Specialty designation: 09 Location: Outpatient facility Ref. Prov.: [name], MD       Interventional Therapy   Procedure: Cervical Facet Medial Branch Block(s) #2  Laterality: Bilateral  Level: TON, C3, C4, C5, and C6 Medial Branch Level(s). Injecting these levels blocks the C3-4, C4-5, and C5-6 cervical facet joints.  Imaging: Fluoroscopic guidance Anesthesia: Local anesthesia (1-2% Lidocaine ) Anxiolysis: IV Versed  2.0 mg Sedation: Minimal Sedation None required. No Fentanyl  administered.         DOS: 05/25/2024  Performed by: [name], MD  Purpose: Diagnostic/Therapeutic Indications: Cervicalgia (cervical spine axial pain) severe enough to impact quality of life or function. 1. Cervicalgia   2. Cervical facet joint pain   3. Cervical facet hypertrophy (Multilevel) (Bilateral)   4. Cervical facet joint arthropathy   5. Cervical facet syndrome   6. Cervical Grade 1 Retrolisthesis of C3/C4 (Stable)   7. Chronic neck pain (1ry area of Pain) (Midline) (Bilateral)   8. Chronic shoulder pain (3ry area of Pain) (Bilateral)   9. Spondylosis without myelopathy or radiculopathy, cervical region   10. Abnormal MRI, cervical spine (07/23/2023)    NAS-11 Pain score:   Pre-procedure: 9 /10   Post-procedure: 0-No  pain/10     Position / Prep / Materials:  Position: Prone. Head in cradle. C-spine slightly flexed. Prep solution: ChloraPrep (2% chlorhexidine gluconate and 70% isopropyl alcohol) Prep Area: Posterior Cervico-thoracic Region. From occipital ridge to tip of scapula, and from shoulder to shoulder. Entire posterior and lateral neck surface. Materials:  Tray: Block Needle(s):  Type: Spinal  Gauge (G): 22  Length: 3.5-in  Qty:  5     H&P (Pre-op Assessment):  Micheal Gay is a 58 y.o. (year old), male patient, seen today for interventional treatment. He  has a past surgical history that includes Eye surgery. Micheal Gay has a current medication list which includes the following prescription(s): amitriptyline , atorvastatin , clonazepam, escitalopram , gabapentin, indomethacin, jardiance , meloxicam, metformin, nortriptyline, nystatin , ondansetron , pantoprazole , pioglitazone , nurtec, sildenafil, tamsulosin , and zavzpret , and the following Facility-Administered Medications: fentanyl . His primarily concern today is the Neck Pain  Initial Vital Signs:  Pulse/HCG Rate: 64ECG Heart Rate: 60 Temp: 99.5 F (37.5 C) Resp: 15 BP: 133/83 SpO2: 99 %  BMI: Estimated body mass index is 31.38 kg/m as calculated from the following:   Height as of this encounter: 5' 11 (1.803 m).   Weight as of this encounter: 225 lb (102.1 kg).  Risk Assessment: Allergies: Reviewed. He is allergic to other and pollen extract.  Allergy Precautions: None required Coagulopathies: Reviewed. None identified.  Blood-thinner therapy: None at this time Active Infection(s): Reviewed. None identified. Micheal Gay is afebrile  Site Confirmation: Micheal Gay was asked to confirm the procedure and laterality before marking the site Procedure checklist: Completed Consent: Before the procedure and under the influence of no sedative(s), amnesic(s), or anxiolytics, the patient was informed of the treatment options, risks and  possible complications. To fulfill our ethical and legal obligations, as recommended by the American Medical  Association's Code of Ethics, I have informed the patient of my clinical impression; the nature and purpose of the treatment or procedure; the risks, benefits, and possible complications of the intervention; the alternatives, including doing nothing; the risk(s) and benefit(s) of the alternative treatment(s) or procedure(s); and the risk(s) and benefit(s) of doing nothing. The patient was provided information about the general risks and possible complications associated with the procedure. These may include, but are not limited to: failure to achieve desired goals, infection, bleeding, organ or nerve damage, allergic reactions, paralysis, and death. In addition, the patient was informed of those risks and complications associated to Spine-related procedures, such as failure to decrease pain; infection (i.e.: Meningitis, epidural or intraspinal abscess); bleeding (i.e.: epidural hematoma, subarachnoid hemorrhage, or any other type of intraspinal or peri-dural bleeding); organ or nerve damage (i.e.: Any type of peripheral nerve, nerve root, or spinal cord injury) with subsequent damage to sensory, motor, and/or autonomic systems, resulting in permanent pain, numbness, and/or weakness of one or several areas of the body; allergic reactions; (i.e.: anaphylactic reaction); and/or death. Furthermore, the patient was informed of those risks and complications associated with the medications. These include, but are not limited to: allergic reactions (i.e.: anaphylactic or anaphylactoid reaction(s)); adrenal axis suppression; blood sugar elevation that in diabetics may result in ketoacidosis or comma; water retention that in patients with history of congestive heart failure may result in shortness of breath, pulmonary edema, and decompensation with resultant heart failure; weight gain; swelling or edema;  medication-induced neural toxicity; particulate matter embolism and blood vessel occlusion with resultant organ, and/or nervous system infarction; and/or aseptic necrosis of one or more joints. Finally, the patient was informed that Medicine is not an exact science; therefore, there is also the possibility of unforeseen or unpredictable risks and/or possible complications that may result in a catastrophic outcome. The patient indicated having understood very clearly. We have given the patient no guarantees and we have made no promises. Enough time was given to the patient to ask questions, all of which were answered to the patient's satisfaction. Mr. Zielinski has indicated that he wanted to continue with the procedure. Attestation: I, the ordering provider, attest that I have discussed with the patient the benefits, risks, side-effects, alternatives, likelihood of achieving goals, and potential problems during recovery for the procedure that I have provided informed consent. Date  Time: 05/25/2024  8:44 AM  Pre-Procedure Preparation:  Monitoring: As per clinic protocol. Respiration, ETCO2, SpO2, BP, heart rate and rhythm monitor placed and checked for adequate function Safety Precautions: Patient was assessed for positional comfort and pressure points before starting the procedure. Time-out: I initiated and conducted the Time-out before starting the procedure, as per protocol. The patient was asked to participate by confirming the accuracy of the Time Out information. Verification of the correct person, site, and procedure were performed and confirmed by me, the nursing staff, and the patient. Time-out conducted as per Joint Commission's Universal Protocol (UP.01.01.01). Time: 1000 Start Time: 1001 hrs.  Description/Narrative of Procedure:          Laterality: See above. Targeted Levels: See above.  Rationale (medical necessity): procedure needed and proper for the diagnosis and/or treatment of  the patient's medical symptoms and needs. Procedural Technique Safety Precautions: Aspiration looking for blood return was conducted prior to all injections. At no point did we inject any substances, as a needle was being advanced. No attempts were made at seeking any paresthesias. Safe injection practices and needle disposal techniques used. Medications  properly checked for expiration dates. SDV (single dose vial) medications used. Description of the Procedure: Protocol guidelines were followed. The patient was assisted into a comfortable position. The target area was identified and the area prepped in the usual manner. Skin & deeper tissues infiltrated with local anesthetic. Appropriate amount of time allowed to pass for local anesthetics to take effect. The procedure needles were then advanced to the target area. Proper needle placement secured. Negative aspiration confirmed. Solution injected in intermittent fashion, asking for systemic symptoms every 0.5cc of injectate. The needles were then removed and the area cleansed, making sure to leave some of the prepping solution back to take advantage of its long term bactericidal properties.  Technical description of process:  C2 Medial Branch Nerve Block (MBB): The target area for the C2 dorsal medial articular branch is the lateral concave waist of the articular pillar of C2. Under fluoroscopic guidance, a Quincke needle was inserted until contact was made with os over the postero-lateral aspect of the articular pillar of C2 (target area). After negative aspiration for blood, 0.5 mL of the nerve block solution was injected without difficulty or complication. The needle was removed intact. Third Occipital Nerve (TON) Block (MBB): The target area for the TON branch is the postero-lateral aspect of the C2-C3 articulation. Under fluoroscopic guidance, a Quincke needle was inserted until contact was made with os over the target area. After negative aspiration for  blood, 0.5 mL of the nerve block solution was injected without difficulty or complication. The needle was removed intact. C3 Medial Branch Nerve Block (MBB): The target area for the C3 dorsal medial articular branch is the lateral concave waist of the articular pillar of C3. Under fluoroscopic guidance, a Quincke needle was inserted until contact was made with os over the postero-lateral aspect of the articular pillar of C3 (target area). After negative aspiration for blood, 0.5 mL of the nerve block solution was injected without difficulty or complication. The needle was removed intact. C4 Medial Branch Nerve Block (MBB): The target area for the C4 dorsal medial articular branch is the lateral concave waist of the articular pillar of C4. Under fluoroscopic guidance, a Quincke needle was inserted until contact was made with os over the postero-lateral aspect of the articular pillar of C4 (target area). After negative aspiration for blood, 0.5 mL of the nerve block solution was injected without difficulty or complication. The needle was removed intact. C5 Medial Branch Nerve Block (MBB): The target area for the C5 dorsal medial articular branch is the lateral concave waist of the articular pillar of C5. Under fluoroscopic guidance, a Quincke needle was inserted until contact was made with os over the postero-lateral aspect of the articular pillar of C5 (target area). After negative aspiration for blood, 0.5 mL of the nerve block solution was injected without difficulty or complication. The needle was removed intact. C6 Medial Branch Nerve Block (MBB): The target area for the C6 dorsal medial articular branch is the lateral concave waist of the articular pillar of C6. Under fluoroscopic guidance, a Quincke needle was inserted until contact was made with os over the postero-lateral aspect of the articular pillar of C6 (target area). After negative aspiration for blood, 0.5 mL of the nerve block solution was  injected without difficulty or complication. The needle was removed intact. C7 Medial Branch Nerve Block (MBB): The target for the C7 dorsal medial articular branch lies on the superior-lateral tip of the C7 transverse process. Under fluoroscopic guidance, a Quincke needle  was inserted until contact was made with os over the postero-lateral aspect of the articular pillar of C7 (target area). After negative aspiration for blood, 0.5 mL of the nerve block solution was injected without difficulty or complication. The needle was removed intact. C8 Medial Branch Nerve Block (MBB): The target for the C8 dorsal medial articular branch lies on the superior-lateral aspect of the T1 transverse process. Under fluoroscopic guidance, a Quincke needle was inserted until contact was made with os over the postero-lateral aspect of the articular pillar of T1 (target area). After negative aspiration for blood, 0.5 mL of the nerve block solution was injected without difficulty or complication. The needle was removed intact.  Once the entire procedure was completed, the treated area was cleaned, making sure to leave some of the prepping solution back to take advantage of its long term bactericidal properties.  Anatomy Reference Guide:      Facet Joint Innervation  C1-2 Third occipital Nerve (TON)  C2-3 TON, C3  Medial Branch  C3-4 C3, C4                     C4-5 C4, C5                     C5-6 C5, C6                     C6-7 C6, C7                     C7-T1 C7, C8                      Cervical Facet Pain Pattern overlap:   Vitals:   05/25/24 1005 05/25/24 1010 05/25/24 1015 05/25/24 1023  BP: (!) 140/91 (!) 144/95 (!) 136/95 133/75  Pulse:      Resp: 16 15 16 17   Temp:    (!) 97.3 F (36.3 C)  SpO2: 100% 100% 100% 100%  Weight:      Height:         Start Time: 1001 hrs. End Time: 1015 hrs.  Imaging Guidance (Spinal):         Type of Imaging Technique: Fluoroscopy Guidance  (Spinal) Indication(s): Fluoroscopy guidance for needle placement to enhance accuracy in procedures requiring precise needle localization for targeted delivery of medication in or near specific anatomical locations not easily accessible without such real-time imaging assistance. Exposure Time: Please see nurses notes. Contrast: None used. Fluoroscopic Guidance: I was personally present during the use of fluoroscopy. Tunnel Vision Technique used to obtain the best possible view of the target area. Parallax error corrected before commencing the procedure. Direction-depth-direction technique used to introduce the needle under continuous pulsed fluoroscopy. Once target was reached, antero-posterior, oblique, and lateral fluoroscopic projection used confirm needle placement in all planes. Images permanently stored in EMR. Interpretation: No contrast injected. I personally interpreted the imaging intraoperatively. Adequate needle placement confirmed in multiple planes. Permanent images saved into the patient's record.  Post-operative Assessment:  Post-procedure Vital Signs:  Pulse/HCG Rate: 64(!) 59 Temp: (!) 97.3 F (36.3 C) Resp: 17 BP: 133/75 SpO2: 100 %  EBL: None  Complications: No immediate post-treatment complications observed by team, or reported by patient.  Note: The patient tolerated the entire procedure well. A repeat set of vitals were taken after the procedure and the patient was kept under observation following institutional policy, for this type of procedure. Post-procedural neurological assessment was  performed, showing return to baseline, prior to discharge. The patient was provided with post-procedure discharge instructions, including a section on how to identify potential problems. Should any problems arise concerning this procedure, the patient was given instructions to immediately contact us , at any time, without hesitation. In any case, we plan to contact the patient by  telephone for a follow-up status report regarding this interventional procedure.  Comments:  No additional relevant information.  Plan of Care (POC)  Orders:  Orders Placed This Encounter  Procedures   CERVICAL FACET (MEDIAL BRANCH NERVE BLOCK)     Scheduling Instructions:     Side: Bilateral     Level: C3-4, C4-5, C5-6, and TBD Facet joints (TON, C3, C4, C5, C6, and TBD Medial Branch Nerves)     Sedation: Patient's choice.     Date: 05/25/2024    Where will this procedure be performed?:   [facility]   [facility] C-ARM 1-60 MIN NO REPORT    Intraoperative interpretation by procedural physician at [facility].    Standing Status:   Standing    Number of Occurrences:   1    Reason for exam::   Assistance in needle guidance and placement for procedures requiring needle placement in or near specific anatomical locations not easily accessible without such assistance.   Informed Consent Details: Physician/Practitioner Attestation; Transcribe to consent form and obtain patient signature    Note: Always confirm laterality of pain with Mr. Suder, before procedure. Transcribe to consent form and obtain patient signature.    Physician/Practitioner attestation of informed consent for procedure/surgical case:   I, the physician/practitioner, attest that I have discussed with the patient the benefits, risks, side effects, alternatives, likelihood of achieving goals and potential problems during recovery for the procedure that I have provided informed consent.    Procedure:   Bilateral Cervical facet block under fluoroscopic guidance.    Physician/Practitioner performing the procedure:   Aahil Fredin A. Tanya, MD    Indication/Reason:   Chronic neck pain secondary to cervical facet syndrome   Provide equipment / supplies at bedside    Procedure tray: Block Tray (Disposable  single use) Skin infiltration needle: Regular 1.5-in, 25-G, (x1) Block Needle type:  Spinal Amount/quantity: 5 Size: Regular (3.5-inch) Gauge: 22G    Standing Status:   Standing    Number of Occurrences:   1    Specify:   Block Tray   Saline lock IV    Have LR [phone] mL available and administer at 125 mL/hr if patient becomes hypotensive.    Standing Status:   Standing    Number of Occurrences:   1     Analgesic: No chronic opioid analgesics therapy prescribed by our practice. None. MME/day: 0 mg/day    Medications ordered for procedure: Meds ordered this encounter  Medications   lidocaine  (XYLOCAINE ) 2 % (with pres) injection 400 mg   pentafluoroprop-tetrafluoroeth (GEBAUERS) aerosol   midazolam  (VERSED ) 5 MG/5ML injection 0.5-2 mg    Make sure Flumazenil is available in the pyxis when using this medication. If oversedation occurs, administer 0.2 mg IV over 15 sec. If after 45 sec no response, administer 0.2 mg again over 1 min; may repeat at 1 min intervals; not to exceed 4 doses (1 mg)   fentaNYL  (SUBLIMAZE ) injection 25-50 mcg    Make sure Narcan is available in the pyxis when using this medication. In the event of respiratory depression (RR< 8/min): Titrate NARCAN (naloxone) in increments of 0.1 to 0.2  mg IV at 2-3 minute intervals, until desired degree of reversal.   ropivacaine  (PF) 2 mg/mL (0.2%) (NAROPIN ) injection 18 mL   dexamethasone  (DECADRON ) injection 20 mg   Medications administered: We administered lidocaine , pentafluoroprop-tetrafluoroeth, midazolam , ropivacaine  (PF) 2 mg/mL (0.2%), and dexamethasone .  See the medical record for exact dosing, route, and time of administration.    Interventional Therapies  Risk Factors  Considerations  Medical Comorbidities:  BPH  HTN  GERD  Hx. BNZ use  T2NIDDM  Bipolar     Planned  Pending:   Diagnostic bilateral cervical facet MBB #2    Under consideration:   Diagnostic bilateral cervical facet MBB #2 w/ possible follow-up RFA.    Completed:   Diagnostic bilateral cervical facet MBB x1  (04/13/2024) (100/100/100 x1 week/70)  Diagnostic midline cervical ESI x2 (03/18/2024) (1st:100/100/80 x 1 week/0) (2nd:100/100/85/80)  Referral to physical therapy entered (10/29/2023)  Missed appointments: (11/11/2023-procedure, 02/04/2024-eval, 03/11/2024-procedure) (12/09/2023-virtual)   Therapeutic  Palliative (PRN) options:   None established   Completed by other providers:   None reported       Follow-up plan:   Return in about 2 weeks (around 06/08/2024) for Eval-day (M,W), (Face2F), (PPE).     Recent Visits Date Type Provider Dept  04/28/24 Office Visit Tanya Glisson, MD [facility]  04/13/24 Procedure visit Tanya Glisson, MD [facility]  04/05/24 Office Visit Tanya Glisson, MD [facility]  03/18/24 Procedure visit Tanya Glisson, MD [facility]  03/08/24 Office Visit Tanya Glisson, MD [facility]  Showing recent visits within past 90 days and meeting all other requirements Today's Visits Date Type Provider Dept  05/25/24 Procedure visit Tanya Glisson, MD [facility]  Showing today's visits and meeting all other requirements Future Appointments Date Type Provider Dept  06/09/24 Appointment Tanya Glisson, MD [facility]  Showing future appointments within next 90 days and meeting all other requirements   Disposition: Discharge home  Discharge (Date  Time): 05/25/2024; 1033 hrs.   Primary Care Physician: [name], MD Location: [facility] Note by: Glisson DELENA Tanya, MD (TTS technology used. I apologize for any typographical errors that were not detected and corrected.) Date: 05/25/2024; Time: 11:00 AM  Disclaimer:  Medicine is not an visual merchandiser. The only guarantee in medicine is that nothing is guaranteed. It is important to note that the decision to proceed with this intervention was based on the information collected  from the patient. The Data and conclusions were drawn from the patient's questionnaire, the interview, and the physical examination. Because the information was provided in large part by the patient, it cannot be guaranteed that it has not been purposely or unconsciously manipulated. Every effort has been made to obtain as much relevant data as possible for this evaluation. It is important to note that the conclusions that lead to this procedure are derived in large part from the available data. Always take into account that the treatment will also be dependent on availability of resources and existing treatment guidelines, considered by other Pain Management Practitioners as being common knowledge and practice, at the time of the intervention. For Medico-Legal purposes, it is also important to point out that variation in procedural techniques and pharmacological choices are the acceptable norm. The indications, contraindications, technique, and results of the above procedure should only be interpreted and judged by a Board-Certified Interventional Pain Specialist with extensive familiarity and expertise in the same exact procedure and technique.

## 2024-05-25 NOTE — Progress Notes (Signed)
 Safety precautions to be maintained throughout the outpatient stay will include: orient to surroundings, keep bed in low position, maintain call bell within reach at all times, provide assistance with transfer out of bed and ambulation.

## 2024-05-26 ENCOUNTER — Telehealth: Payer: Self-pay | Admitting: *Deleted

## 2024-05-26 ENCOUNTER — Telehealth: Payer: Self-pay | Admitting: Pain Medicine

## 2024-05-26 ENCOUNTER — Encounter: Payer: Self-pay | Admitting: Nurse Practitioner

## 2024-05-26 NOTE — Telephone Encounter (Signed)
 FYI patient called back stated that he rested well yesterday. However patient stated he is having pain in his neck. PT also stated that he need a note so he can return to physical therapy. PT stated that he will come by on tomorrow to pick up note. Please give patient a call. TY

## 2024-05-26 NOTE — Telephone Encounter (Signed)
 Letter constructed and given to [facility] for patient to retrieve.

## 2024-05-26 NOTE — Telephone Encounter (Signed)
 Called back to get further information on what kind of note he needs.  No answer, mailbox is full.

## 2024-05-26 NOTE — Telephone Encounter (Signed)
 Post procedure call; voicemail left

## 2024-05-28 ENCOUNTER — Other Ambulatory Visit: Payer: Self-pay | Admitting: Internal Medicine

## 2024-05-28 DIAGNOSIS — E119 Type 2 diabetes mellitus without complications: Secondary | ICD-10-CM

## 2024-05-29 ENCOUNTER — Other Ambulatory Visit: Payer: Self-pay | Admitting: Internal Medicine

## 2024-05-29 DIAGNOSIS — E119 Type 2 diabetes mellitus without complications: Secondary | ICD-10-CM

## 2024-06-07 ENCOUNTER — Other Ambulatory Visit: Payer: Self-pay | Admitting: Family

## 2024-06-07 DIAGNOSIS — R748 Abnormal levels of other serum enzymes: Secondary | ICD-10-CM

## 2024-06-07 DIAGNOSIS — N182 Chronic kidney disease, stage 2 (mild): Secondary | ICD-10-CM | POA: Insufficient documentation

## 2024-06-07 NOTE — Progress Notes (Signed)
 Chief Complaint:   Chief Complaint  Patient presents with  . Lab Results    Subjective   History of Present Illness Micheal Gay is a 58 year old male who presents with elevated liver enzymes.  His liver enzymes, specifically AST and ALT, were slightly elevated in June and have increased further since then. He denies current alcohol use, stating he has not consumed alcohol recently.  He is currently taking multiple medications including Lexapro , which was recently increased from 5 mg to 10 mg, Depakote, gabapentin, clonazepam as needed, amitriptyline , Lipitor, Jardiance , meloxicam, Actos , Protonix , metformin, Viagra, and Flomax . He takes meloxicam daily for pain, noting that clonazepam also helps with his pain. He mentions that he was switched from Zoloft to Lexapro .  He continues to experience issues with tongue movement and excessive salivation, which he takes medication for at bedtime. He is unsure how long it will take for Depakote to alleviate these symptoms.  He reports frequent urination and occasional abdominal pain, with variable bowel movements ranging from constipation to loose stools. He had a colonoscopy last year. His GFR was noted to be 71. He is also interested in his A1c levels, which were not available at this visit.  He recalls receiving a hepatitis B vaccination and mentions that he has antibodies from the vaccine. No acute hepatitis symptoms.   Patient Active Problem List  Diagnosis  . Obesity  . Hyperlipidemia  . Depression  . Bipolar I disorder (CMS/HHS-HCC)  . Esophageal reflux  . Essential hypertension  . Keratoconus of both eyes  . Status post corneal transplant  . Benign prostatic hyperplasia with lower urinary tract symptoms  . Type 2 diabetes mellitus without complication, without long-term current use of insulin (CMS/HHS-HCC)  . Urinary frequency  . Tension headache  . Other dysphagia  . Nocturnal enuresis  . Esophageal stenosis  . Abnormal  rhythmic movement of tongue  . Chronic pain syndrome  . Spondylosis without myelopathy or radiculopathy, cervical region  . Fatty liver  . Foraminal stenosis of cervical region  . Vitamin D  insufficiency  . Cervical facet syndrome  . Elevated liver function tests  . Elevated liver enzymes  . CKD (chronic kidney disease) stage 2, GFR 60-89 ml/min    Outpatient Medications Prior to Visit  Medication Sig Dispense Refill  . amitriptyline  (ELAVIL ) 10 MG tablet Take 1 tablet by mouth at bedtime    . atorvastatin  (LIPITOR) 20 MG tablet Take 20 mg by mouth once daily    . clonazePAM (KLONOPIN) 0.5 MG tablet Take 1 tablet (0.5 mg total) by mouth 3 (three) times daily 90 tablet 2  . divalproex (DEPAKOTE) 125 MG DR tablet Take 125 mg before bed time 30 tablet 2  . empagliflozin  (JARDIANCE ) 25 mg tablet Take 25 mg by mouth once daily    . escitalopram  oxalate (LEXAPRO ) 10 MG tablet Take 10 mg by mouth once daily    . gabapentin (NEURONTIN) 400 MG capsule TAKE 1 CAPSULE BY MOUTH TWICE DAILY AND 2 AT NIGHT 120 capsule 2  . meloxicam (MOBIC) 15 MG tablet Take 15 mg by mouth once daily    . metFORMIN (GLUCOPHAGE) 1000 MG tablet Take 1,000 mg by mouth 2 (two) times daily    . pantoprazole  (PROTONIX ) 40 MG DR tablet Take 40 mg by mouth once daily    . pioglitazone  (ACTOS ) 45 MG tablet Take 45 mg by mouth once daily    . sildenafiL (VIAGRA) 100 MG tablet Take 50 mg by mouth once daily  as needed for Erectile Dysfunction    . tamsulosin  (FLOMAX ) 0.4 mg capsule TAKE 1 CAPSULE BY MOUTH ONCE DAILY AFTER BREAKFAST    . escitalopram  oxalate (LEXAPRO ) 10 MG tablet Take 1 tablet (10 mg total) by mouth once daily for 90 days 30 tablet 2  . escitalopram  oxalate (LEXAPRO ) 5 MG tablet Take 5 mg by mouth once daily    . sertraline (ZOLOFT) 25 MG tablet Take 25 mg by mouth once daily    . lisinopriL (ZESTRIL) 10 MG tablet Take 10 mg by mouth once daily (Patient not taking: Reported on 05/24/2024)     No  facility-administered medications prior to visit.     Objective  Vitals:   06/07/24 0909  BP: 124/73  Pulse: 69  Weight: (!) 103.4 kg (228 lb)  PainSc: 0-No pain   Body mass index is 31.8 kg/m. Home Vitals:     Physical Exam  Physical Exam Constitutional:      Appearance: Normal appearance.  Pulmonary:     Effort: Pulmonary effort is normal.  Abdominal:     General: Abdomen is flat. Bowel sounds are normal. There is no distension.     Palpations: Abdomen is soft.     Tenderness: There is no abdominal tenderness. There is no guarding.  Neurological:     Mental Status: He is alert and oriented to person, place, and time.     Gait: Gait normal.  Psychiatric:        Mood and Affect: Mood normal.        Behavior: Behavior normal.    Results LABS AST: 65 (02/2024) ALT: 48 (02/2024) GFR: 71   Assessment/Plan:   Assessment & Plan Elevated liver enzymes Liver enzymes (AST and ALT) elevated since June. Potential medication-related cause considered, including atorvastatin   - Order repeat liver function tests. - Order abdominal ultrasound at [facility]. - Consult with clinical pharmacist to review medications for potential hepatotoxicity. - Order hepatitis panel. - Follow up pending lab results   Chronic kidney disease, stage 2 GFR 71, stage 2 CKD. Diabetes and meloxicam use may contribute. - Order repeat kidney function tests. - Review meloxicam use.  Type 2 diabetes mellitus A1c not available. Jardiance  may cause urinary frequency. Consider discontinuation if A1c controlled. - Order A1c test. - Consider discontinuing Jardiance  if A1c is controlled.  Tongue movement disorder and sialorrhea Ongoing disorder and sialorrhea. Depakote recently started by neurologist. - Continue current medications as prescribed by neurologist. - Follow up with neurologist in a couple of weeks.  Chronic pain Managed with clonazepam as needed, effective. -  Continue clonazepam as needed for pain management.  Depressive disorder On Lexapro , increased to 10 mg by neurologist. Zoloft discontinued. - Continue Lexapro  10 mg daily. - Discontinue Zoloft.  Diagnoses and all orders for this visit:  Elevated liver enzymes -     Comprehensive Metabolic Panel (CMP); Future -     Comprehensive Metabolic Panel (CMP); Future -     Hepatitis Panel, Acute; Future -     US  abdomen complete; Future  CKD (chronic kidney disease) stage 2, GFR 60-89 ml/min -     Complete Blood Count (CBC) with Differential; Future -     Comprehensive Metabolic Panel (CMP); Future -     Hepatitis Panel, Acute; Future  Anemia, unspecified type -     Complete Blood Count (CBC) with Differential; Future -     Ferritin; Future -     Iron and Total Iron Binding Capacity (  TIBC); Future -     Ferritin; Future -     Iron and Total Iron Binding Capacity (TIBC); Future -     Complete Blood Count (CBC) with Differential; Future  Type 2 diabetes mellitus without complication, without long-term current use of insulin (CMS/HHS-HCC) -     Hemoglobin A1C; Future    This visit was coded based on medical decision making (MDM).           Future Appointments     Date/Time Provider Department Center Visit Type   06/09/2024 11:00 AM [place], Ladell Eck, MD [facility] C FOLLOW UP   06/22/2024 11:30 AM Lane Arthea Locus, MD [facility] C RETURN VISIT   07/02/2024 11:00 AM BEHAVIORAL HEALTH TRIAGE [facility] Triage at [facility] CIVITAN TELEPHONE CALL (NO CHARGE)       There are no Patient Instructions on file for this visit.  An after visit summary was provided for the patient either in written format (printed) or through [facility].  This note has been created using automated tools and reviewed for accuracy by [facility] ANN DEVINE.

## 2024-06-08 ENCOUNTER — Ambulatory Visit
Admission: RE | Admit: 2024-06-08 | Discharge: 2024-06-08 | Disposition: A | Discharge status: Home / Self Care | Source: Ambulatory Visit | Attending: Family | Admitting: Family

## 2024-06-08 ENCOUNTER — Other Ambulatory Visit: Payer: Self-pay | Admitting: Internal Medicine

## 2024-06-08 DIAGNOSIS — R748 Abnormal levels of other serum enzymes: Secondary | ICD-10-CM | POA: Diagnosis present

## 2024-06-08 NOTE — Progress Notes (Unsigned)
 PROVIDER NOTE: Interpretation of information contained herein should be left to medically-trained personnel. Specific patient instructions are provided elsewhere under Patient Instructions section of medical record. This document was created in part using AI and STT-dictation technology, any transcriptional errors that may result from this process are unintentional.  Patient: Micheal Gay  Service: E/M   PCP: Devine, Jaimie, NP  DOB: 17-Feb-1966  DOS: 06/09/2024  Provider: [name], MD  MRN: [id number]  Delivery: Face-to-face  Specialty: Interventional Pain Management  Type: Established Patient  Setting: Ambulatory outpatient facility  Specialty designation: 09  Referring Prov.: [name], MD  Location: Outpatient office facility       History of present illness (HPI) Micheal Gay, a 58 y.o. year old male, is here today because of his No primary diagnosis found.. Micheal Gay primary complain today is No chief complaint on file.  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  Pain Assessment: Severity of   is reported as a  /10. Location:    / . Onset:  . Quality:  .  Timing:  . Modifying factor(s):  SABRA Vitals:  vitals were not taken for this visit.  BMI: Estimated body mass index is 31.38 kg/m as calculated from the following:   Height as of 05/25/24: 5' 11 (1.803 m).   Weight as of 05/25/24: 225 lb (102.1 kg).  Last encounter: 04/28/2024. Last procedure: 05/25/2024.  Reason for encounter: post-procedure evaluation and assessment.   Discussed the use of AI scribe software for clinical note transcription with the patient, who gave verbal consent to proceed.  History of Present Illness          Post-Procedure Evaluation   Procedure: Cervical Facet Medial Branch Block(s) #2  Laterality: Bilateral  Level: TON, C3, C4, C5, C6, C7, C8, and T1 Medial Branch Level(s). Injecting these levels blocks the C2-3, C3-4, C4-5, C5-6, C6-7, and C7-T1 cervical facet joints.  Imaging: Fluoroscopic guidance Anesthesia: Local anesthesia (1-2% Lidocaine ) Anxiolysis: IV Versed  2.0 mg Sedation: Minimal Sedation None required. No Fentanyl  administered.         DOS: 05/25/2024  Performed by: [name], MD  Purpose: Diagnostic/Therapeutic Indications: Cervicalgia (cervical spine axial pain) severe enough to impact quality of life or function. 1. Cervicalgia   2. Cervical facet joint pain   3. Cervical facet hypertrophy (Multilevel) (Bilateral)   4. Cervical facet joint arthropathy   5. Cervical facet syndrome   6. Cervical Grade 1 Retrolisthesis of C3/C4 (Stable)   7. Chronic neck pain (1ry area of Pain) (Midline) (Bilateral)   8. Chronic shoulder pain (3ry area of Pain) (Bilateral)   9. Spondylosis without myelopathy or radiculopathy, cervical region   10. Abnormal MRI, cervical spine (07/23/2023)    NAS-11 Pain score:   Pre-procedure: 9 /10   Post-procedure: 0-No pain/10     Effectiveness:  Initial hour after procedure:   ***.  Subsequent 4-6 hours post-procedure:   ***. Analgesia past initial 6 hours:   ***. Ongoing improvement:  Analgesic:   *** Function:    ***    ROM:    ***     Pharmacotherapy Assessment   Analgesic: No chronic opioid analgesics therapy prescribed by our practice. None. MME/day: 0 mg/day   Monitoring: [place] PMP: PDMP reviewed during this encounter.       Pharmacotherapy: No side-effects or adverse reactions reported. Compliance: No problems identified. Effectiveness: Clinically acceptable.  No notes on file  UDS:  Summary  Date Value Ref Range Status  10/08/2023 FINAL  Final    Comment:    ==================================================================== Compliance Drug Analysis, Ur ==================================================================== Test                             Result       Flag       Units  Drug Present and Declared for Prescription Verification   7-aminoclonazepam              161          EXPECTED   ng/mg creat    7-aminoclonazepam is an expected metabolite of clonazepam. Source of    clonazepam is a scheduled prescription medication.    Gabapentin                     PRESENT      EXPECTED  Drug Absent but Declared for Prescription Verification   Amitriptyline                   Not Detected UNEXPECTED   Nortriptyline                  Not Detected UNEXPECTED   Promethazine                    Not Detected UNEXPECTED ==================================================================== Test                      Result    Flag   Units      Ref Range   Creatinine              49               mg/dL      >=79 ==================================================================== Declared Medications:  The flagging and interpretation on this report are based on the  following declared medications.  Unexpected results may arise from  inaccuracies in the declared medications.   **Note: The testing scope of this panel includes these medications:   Amitriptyline  (Elavil )  Clonazepam (Klonopin)  Gabapentin  Nortriptyline (Pamelor)  Promethazine  (Phenergan )   **Note: The  testing scope of this panel does not include the  following reported medications:   Atorvastatin  (Lipitor)  Empagliflozin  (Jardiance )  Indomethacin (Indocin)  Meloxicam (Mobic)  Metformin  Ondansetron  (Zofran )  Pantoprazole  (Protonix )  Pioglitazone  (Actos )  Rimegepant (Nurtec)  Sildenafil (Viagra)  Zavegepant (Zavzpret ) ==================================================================== For clinical consultation, please call [phone]. ====================================================================     No results found for: CBDTHCR No results found for: D8THCCBX No results found for: D9THCCBX  ROS  Constitutional: Denies any fever or chills Gastrointestinal: No reported hemesis, hematochezia, vomiting, or acute GI distress Musculoskeletal: Denies any acute onset joint swelling, redness, loss of ROM, or weakness Neurological: No reported episodes of acute onset apraxia, aphasia, dysarthria, agnosia, amnesia, paralysis, loss of coordination, or loss of consciousness  Medication  Review  Rimegepant Sulfate, Zavegepant HCl, amitriptyline , atorvastatin , clonazePAM, empagliflozin , escitalopram , gabapentin, indomethacin, meloxicam, metFORMIN, nortriptyline, nystatin , ondansetron , pantoprazole , pioglitazone , sildenafil, and tamsulosin   History Review  Allergy: Micheal Gay is allergic to other and pollen extract. Drug: Micheal Gay  reports no history of drug use. Alcohol:  reports no history of alcohol use. Tobacco:  reports that he has never smoked. He quit smokeless tobacco use about 41 years ago.  His smokeless tobacco use included chew. Social: Micheal Gay  reports that he has never smoked. He quit smokeless tobacco use about 41 years ago.  His smokeless tobacco use included chew. He reports that he does not drink alcohol and does not use drugs. Medical:  has a past medical history of Bipolar 1 disorder (HCC), Depression, Diabetes mellitus without complication  (HCC), Keratoconus of right eye, Sepsis (HCC) (09/22/2016), and Septicemia (HCC) (09/22/2016). Surgical: Micheal Gay  has a past surgical history that includes Eye surgery. Family: family history includes Breast cancer in his mother; Diabetes in his mother; Heart attack in his father.  Laboratory Chemistry Profile   Renal Lab Results  Component Value Date   BUN 31 (H) 02/19/2024   CREATININE 1.18 02/19/2024   BCR 26 (H) 02/19/2024   GFRAA >60 01/18/2017   GFRNONAA >60 12/21/2022    Hepatic Lab Results  Component Value Date   AST 65 (H) 02/19/2024   ALT 48 (H) 02/19/2024   ALBUMIN 4.5 02/19/2024   ALKPHOS 69 02/19/2024    Electrolytes Lab Results  Component Value Date   NA 139 02/19/2024   K 4.2 02/19/2024   CL 99 02/19/2024   CALCIUM  9.2 02/19/2024   MG 2.0 10/08/2023    Bone Lab Results  Component Value Date   25OHVITD1 27 (L) 10/08/2023   25OHVITD2 <1.0 10/08/2023   25OHVITD3 26 10/08/2023    Inflammation (CRP: Acute Phase) (ESR: Chronic Phase) Lab Results  Component Value Date   CRP <1 10/08/2023   ESRSEDRATE 23 10/08/2023   LATICACIDVEN 1.5 09/22/2016         Note: Above Lab results reviewed.  Recent Imaging Review  US  Abdomen Complete CLINICAL DATA:  elevated liver enzymes  EXAM: ABDOMEN ULTRASOUND COMPLETE  COMPARISON:  None Available.  FINDINGS: Gallbladder:  No gallstones. No wall thickening or pericholecystic fluid. No sonographic Murphy's sign noted by sonographer.  Common bile duct:  Diameter: 4 mm  Liver:  Normal echogenicity. No focal lesion identified. No intrahepatic biliary ductal dilation. Portal vein is patent on color Doppler imaging with normal direction of blood flow towards the liver.  IVC: No abnormality visualized.  Pancreas: Visualized portion unremarkable.  Spleen: Suboptimally evaluated due to overlying bowel gas and rib shadow.  Right Kidney: Length: 12.9 cm. Normal echogenicity. No mass. No hydronephrosis  or nephrolithiasis.  Left Kidney: Length: 12.7 cm. Normal echogenicity. No mass. Moderate to severe hydronephrosis.  Abdominal aorta: No aneurysm visualized.  Other findings: Trabeculation of the bladder wall with layering echogenic debris. A large bladder diverticulum is noted. Abnormal postvoid residual of 222 mL.  IMPRESSION: 1. Moderate to severe left-sided hydronephrosis. 2. Abnormal postvoid residual of 222 mL. Bladder wall thickening with trabeculation of the bladder wall, either due to chronic bladder outlet obstruction or neurogenic bladder. Correlation with urinalysis recommended to exclude acute cystitis.  These results will be called to the ordering clinician or representative by the Radiologist Assistant and communication documented in the PACS or [employer].  Electronically Signed   By: Rogelia Myers M.D.   On: 06/08/2024  11:18 Note: Reviewed        Physical Exam  Vitals: There were no vitals taken for this visit. BMI: Estimated body mass index is 31.38 kg/m as calculated from the following:   Height as of 05/25/24: 5' 11 (1.803 m).   Weight as of 05/25/24: 225 lb (102.1 kg). Ideal: Patient weight not recorded General appearance: Well nourished, well developed, and well hydrated. In no apparent acute distress Mental status: Alert, oriented x 3 (person, place, & time)       Respiratory: No evidence of acute respiratory distress Eyes: PERLA   Assessment   Diagnosis Status  No diagnosis found. Controlled Controlled Controlled   Updated Problems: No problems updated.  Plan of Care  Problem-specific:  Assessment and Plan            Micheal Gay has a current medication list which includes the following long-term medication(s): amitriptyline , atorvastatin , clonazepam, escitalopram , gabapentin, metformin, nortriptyline, pioglitazone , and sildenafil.  Pharmacotherapy (Medications Ordered): No orders of the defined types were placed in  this encounter.  Orders:  No orders of the defined types were placed in this encounter.    Interventional Therapies  Risk Factors  Considerations  Medical Comorbidities:  BPH  HTN  GERD  Hx. BNZ use  T2NIDDM  Bipolar     Planned  Pending:   Diagnostic bilateral cervical facet MBB #2    Under consideration:   Diagnostic bilateral cervical facet MBB #2 w/ possible follow-up RFA.    Completed:   Diagnostic bilateral cervical facet MBB x1 (04/13/2024) (100/100/100 x1 week/70)  Diagnostic midline cervical ESI x2 (03/18/2024) (1st:100/100/80 x 1 week/0) (2nd:100/100/85/80)  Referral to physical therapy entered (10/29/2023)  Missed appointments: (11/11/2023-procedure, 02/04/2024-eval, 03/11/2024-procedure) (12/09/2023-virtual)   Therapeutic  Palliative (PRN) options:   None established   Completed by other providers:   None reported      No follow-ups on file.    Recent Visits Date Type Provider Dept  05/25/24 Procedure visit Tanya Glisson, MD [facility]  04/28/24 Office Visit Tanya Glisson, MD [facility]  04/13/24 Procedure visit Tanya Glisson, MD [facility]  04/05/24 Office Visit Tanya Glisson, MD [facility]  03/18/24 Procedure visit Tanya Glisson, MD [facility]  Showing recent visits within past 90 days and meeting all other requirements Future Appointments Date Type Provider Dept  06/09/24 Appointment Tanya Glisson, MD [facility]  Showing future appointments within next 90 days and meeting all other requirements  I discussed the assessment and treatment plan with the patient. The patient was provided an opportunity to ask questions and all were answered. The patient agreed with the plan and demonstrated an understanding of the instructions.  Patient advised to call back or seek an in-person evaluation if the symptoms or condition worsens.  Duration of encounter: ***  minutes.  Total time on encounter, as per AMA guidelines included both the face-to-face and non-face-to-face time personally spent by the physician and/or other qualified health care professional(s) on the day of the encounter (includes time in activities that require the physician or other qualified health care professional and does not include time in activities normally performed by clinical staff). Physician's time may include the following activities when performed: Preparing to see the patient (e.g., pre-charting review of records, searching for previously ordered imaging, lab work, and nerve conduction tests) Review of prior analgesic pharmacotherapies. Reviewing PMP Interpreting ordered tests (e.g., lab work, imaging, nerve conduction tests) Performing post-procedure evaluations, including interpretation of  diagnostic procedures Obtaining and/or reviewing separately obtained history Performing a medically appropriate examination and/or evaluation Counseling and educating the patient/family/caregiver Ordering medications, tests, or procedures Referring and communicating with other health care professionals (when not separately reported) Documenting clinical information in the electronic or other health record Independently interpreting results (not separately reported) and communicating results to the patient/ family/caregiver Care coordination (not separately reported)  Note by: [name], MD (TTS and AI technology used. I apologize for any typographical errors that were not detected and corrected.) Date: 06/09/2024; Time: 2:58 PM

## 2024-06-09 ENCOUNTER — Ambulatory Visit: Attending: Pain Medicine | Admitting: Pain Medicine

## 2024-06-09 ENCOUNTER — Encounter: Payer: Self-pay | Admitting: Pain Medicine

## 2024-06-09 ENCOUNTER — Other Ambulatory Visit: Payer: Self-pay | Admitting: Internal Medicine

## 2024-06-09 VITALS — BP 124/69 | HR 71 | Temp 97.9°F | Ht 71.0 in | Wt 230.0 lb

## 2024-06-09 DIAGNOSIS — Z09 Encounter for follow-up examination after completed treatment for conditions other than malignant neoplasm: Secondary | ICD-10-CM | POA: Diagnosis present

## 2024-06-09 DIAGNOSIS — M542 Cervicalgia: Secondary | ICD-10-CM | POA: Insufficient documentation

## 2024-06-09 DIAGNOSIS — M25511 Pain in right shoulder: Secondary | ICD-10-CM | POA: Diagnosis present

## 2024-06-09 DIAGNOSIS — M4310 Spondylolisthesis, site unspecified: Secondary | ICD-10-CM | POA: Diagnosis not present

## 2024-06-09 DIAGNOSIS — G8929 Other chronic pain: Secondary | ICD-10-CM | POA: Diagnosis present

## 2024-06-09 DIAGNOSIS — E119 Type 2 diabetes mellitus without complications: Secondary | ICD-10-CM

## 2024-06-09 DIAGNOSIS — M47812 Spondylosis without myelopathy or radiculopathy, cervical region: Secondary | ICD-10-CM | POA: Insufficient documentation

## 2024-06-09 DIAGNOSIS — M25512 Pain in left shoulder: Secondary | ICD-10-CM | POA: Diagnosis present

## 2024-06-09 NOTE — Progress Notes (Signed)
 Safety precautions to be maintained throughout the outpatient stay will include: orient to surroundings, keep bed in low position, maintain call bell within reach at all times, provide assistance with transfer out of bed and ambulation.

## 2024-06-09 NOTE — Patient Instructions (Signed)
 ______________________________________________________________________    Procedure instructions  Stop blood-thinners  Do not eat or drink fluids (other than water ) for 6 hours before your procedure  No water  for 2 hours before your procedure  Take your blood pressure medicine with a sip of water   Arrive 30 minutes before your appointment  If sedation is planned, bring suitable driver. [place], [place], & public transportation are NOT APPROVED)  Carefully read the Preparing for your procedure detailed instructions  If you have questions call us  at [phone]  Procedure appointments are for procedures only.   NO medication refills or new problem evaluations will be done on procedure days.   Only the scheduled, pre-approved procedure and side will be done.   ______________________________________________________________________     ______________________________________________________________________    Preparing for your procedure  Appointments: If you think you may not be able to keep your appointment, call 24-48 hours in advance to cancel. We need time to make it available to others.  Procedure visits are for procedures only. During your procedure appointment there will be: NO Prescription Refills*. NO medication changes or discussions*. NO discussion of disability issues*. NO unrelated pain problem evaluations*. NO evaluations to order other pain procedures*. *These will be addressed at a separate and distinct evaluation encounter on the provider's evaluation schedule and not during procedure days.  Instructions: Food intake: Avoid eating anything solid for at least 8 hours prior to your procedure. Clear liquid intake: You may take clear liquids such as water  up to 2 hours prior to your procedure. (No carbonated drinks. No soda.) Transportation: Unless otherwise stated by your physician, bring a driver. (Driver cannot be a Market researcher, Pharmacist, community, or any other form of public  transportation.) Morning Medicines: Except for blood thinners, take all of your other morning medications with a sip of water . Make sure to take your heart and blood pressure medicines. If your blood pressure's lower number is above 100, the case will be rescheduled. Blood thinners: Make sure to stop your blood thinners as instructed.  If you take a blood thinner, but were not instructed to stop it, call our office [phone] and ask to talk to a nurse. Not stopping a blood thinner prior to certain procedures could lead to serious complications. Diabetics on insulin : Notify the staff so that you can be scheduled 1st case in the morning. If your diabetes requires high dose insulin , take only  of your normal insulin  dose the morning of the procedure and notify the staff that you have done so. Preventing infections: Shower with an antibacterial soap the morning of your procedure.  Build-up your immune system: Take 1000 mg of Vitamin C with every meal (3 times a day) the day prior to your procedure. Antibiotics: Inform the nursing staff if you are taking any antibiotics or if you have any conditions that may require antibiotics prior to procedures. (Example: recent joint implants)   Pregnancy: If you are pregnant make sure to notify the nursing staff. Not doing so may result in injury to the fetus, including death.  Sickness: If you have a cold, fever, or any active infections, call and cancel or reschedule your procedure. Receiving steroids while having an infection may result in complications. Arrival: You must be in the facility at least 30 minutes prior to your scheduled procedure. Tardiness: Your scheduled time is also the cutoff time. If you do not arrive at least 15 minutes prior to your procedure, you will be rescheduled.  Children: Do not bring any children with  you. Make arrangements to keep them home. Dress appropriately: There is always a possibility that your clothing may get soiled. Avoid  long dresses. Valuables: Do not bring any jewelry or valuables.  Reasons to call and reschedule or cancel your procedure: (Following these recommendations will minimize the risk of a serious complication.) Surgeries: Avoid having procedures within 2 weeks of any surgery. (Avoid for 2 weeks before or after any surgery). Flu Shots: Avoid having procedures within 2 weeks of a flu shots or . (Avoid for 2 weeks before or after immunizations). Barium: Avoid having a procedure within 7-10 days after having had a radiological study involving the use of radiological contrast. (Myelograms, Barium swallow or enema study). Heart attacks: Avoid any elective procedures or surgeries for the initial 6 months after a Myocardial Infarction (Heart Attack). Blood thinners: It is imperative that you stop these medications before procedures. Let us  know if you if you take any blood thinner.  Infection: Avoid procedures during or within two weeks of an infection (including chest colds or gastrointestinal problems). Symptoms associated with infections include: Localized redness, fever, chills, night sweats or profuse sweating, burning sensation when voiding, cough, congestion, stuffiness, runny nose, sore throat, diarrhea, nausea, vomiting, cold or Flu symptoms, recent or current infections. It is specially important if the infection is over the area that we intend to treat. Heart and lung problems: Symptoms that may suggest an active cardiopulmonary problem include: cough, chest pain, breathing difficulties or shortness of breath, dizziness, ankle swelling, uncontrolled high or unusually low blood pressure, and/or palpitations. If you are experiencing any of these symptoms, cancel your procedure and contact your primary care physician for an evaluation.  Remember:  Regular Business hours are:  Monday to Thursday 8:00 AM to 4:00 PM  Provider's Schedule: Eric Como, MD:  Procedure days: Tuesday and Thursday 7:30  AM to 4:00 PM  Wallie Sherry, MD:  Procedure days: Monday and Wednesday 7:30 AM to 4:00 PM Last  Updated: 08/19/2023 ______________________________________________________________________     ______________________________________________________________________    General Risks and Possible Complications  Patient Responsibilities: It is important that you read this as it is part of your informed consent. It is our duty to inform you of the risks and possible complications associated with treatments offered to you. It is your responsibility as a patient to read this and to ask questions about anything that is not clear or that you believe was not covered in this document.  Patient's Rights: You have the right to refuse treatment. You also have the right to change your mind, even after initially having agreed to have the treatment done. However, under this last option, if you wait until the last second to change your mind, you may be charged for the materials used up to that point.  Introduction: Medicine is not an Visual merchandiser. Everything in Medicine, including the lack of treatment(s), carries the potential for danger, harm, or loss (which is by definition: Risk). In Medicine, a complication is a secondary problem, condition, or disease that can aggravate an already existing one. All treatments carry the risk of possible complications. The fact that a side effects or complications occurs, does not imply that the treatment was conducted incorrectly. It must be clearly understood that these can happen even when everything is done following the highest safety standards.  No treatment: You can choose not to proceed with the proposed treatment alternative. The "PRO(s)" would include: avoiding the risk of complications associated with the therapy. The "CON(s)" would include:  not getting any of the treatment benefits. These benefits fall under one of three categories: diagnostic; therapeutic; and/or  palliative. Diagnostic benefits include: getting information which can ultimately lead to improvement of the disease or symptom(s). Therapeutic benefits are those associated with the successful treatment of the disease. Finally, palliative benefits are those related to the decrease of the primary symptoms, without necessarily curing the condition (example: decreasing the pain from a flare-up of a chronic condition, such as incurable terminal cancer).  General Risks and Complications: These are associated to most interventional treatments. They can occur alone, or in combination. They fall under one of the following six (6) categories: no benefit or worsening of symptoms; bleeding; infection; nerve damage; allergic reactions; and/or death. No benefits or worsening of symptoms: In Medicine there are no guarantees, only probabilities. No healthcare provider can ever guarantee that a medical treatment will work, they can only state the probability that it may. Furthermore, there is always the possibility that the condition may worsen, either directly, or indirectly, as a consequence of the treatment. Bleeding: This is more common if the patient is taking a blood thinner, either prescription or over the counter (example: Goody Powders, Fish oil, Aspirin, Garlic, etc.), or if suffering a condition associated with impaired coagulation (example: Hemophilia, cirrhosis of the liver, low platelet counts, etc.). However, even if you do not have one on these, it can still happen. If you have any of these conditions, or take one of these drugs, make sure to notify your treating physician. Infection: This is more common in patients with a compromised immune system, either due to disease (example: diabetes, cancer, human immunodeficiency virus [HIV], etc.), or due to medications or treatments (example: therapies used to treat cancer and rheumatological diseases). However, even if you do not have one on these, it can still  happen. If you have any of these conditions, or take one of these drugs, make sure to notify your treating physician. Nerve Damage: This is more common when the treatment is an invasive one, but it can also happen with the use of medications, such as those used in the treatment of cancer. The damage can occur to small secondary nerves, or to large primary ones, such as those in the spinal cord and brain. This damage may be temporary or permanent and it may lead to impairments that can range from temporary numbness to permanent paralysis and/or brain death. Allergic Reactions: Any time a substance or material comes in contact with our body, there is the possibility of an allergic reaction. These can range from a mild skin rash (contact dermatitis) to a severe systemic reaction (anaphylactic reaction), which can result in death. Death: In general, any medical intervention can result in death, most of the time due to an unforeseen complication. ______________________________________________________________________      ______________________________________________________________________    Steroid injections  Common steroids for injections Triamcinolone: Used by many sports medicine physicians for large joint and bursal injections, often combined with a local anesthetic like lidocaine . A study focusing on coccydynia (tailbone pain) found triamcinolone was more effective than betamethasone , suggesting it may also be preferable for other localized inflammation conditions. Methylprednisolone: A common alternative to triamcinolone that is also a strong anti-inflammatory. It is available in different formulations, with the acetate suspension being the long-acting option for intra-articular injections. Dexamethasone : This is a non-particulate steroid, meaning it has a lower risk of tissue damage compared to particulate steroids like triamcinolone and methylprednisolone. While less common for this specific  use,  it is an option for targeted injections.   Considerations for physicians Particulate vs. non-particulate steroids: Triamcinolone and methylprednisolone are particulate, meaning they can clump together. Dexamethasone  is non-particulate. Particulate steroids are often preferred for their longer-lasting effects but carry a theoretical higher risk for certain injections (though this is less of a concern in the costochondral joints). Combined injectate: Corticosteroids are typically mixed with a local anesthetic like lidocaine  to provide both immediate pain relief (from the anesthetic) and longer-term inflammation reduction (from the steroid). Imaging guidance: To ensure accurate placement of the needle and medication, physicians may use ultrasound or fluoroscopic guidance for the injection, especially in complex or refractory cases.   Patient guidance Before undergoing a steroid injection, discuss the options with your physician. They will determine the best steroid, dosage, and procedure for your specific case based on factors like: Severity of your condition History of response to other treatments Your overall health status Experience and preference of the physician  Last  Updated: 05/04/2024 ______________________________________________________________________

## 2024-06-11 ENCOUNTER — Ambulatory Visit

## 2024-06-11 DIAGNOSIS — R1310 Dysphagia, unspecified: Secondary | ICD-10-CM | POA: Diagnosis not present

## 2024-06-11 DIAGNOSIS — K2100 Gastro-esophageal reflux disease with esophagitis, without bleeding: Secondary | ICD-10-CM | POA: Diagnosis present

## 2024-06-11 DIAGNOSIS — K449 Diaphragmatic hernia without obstruction or gangrene: Secondary | ICD-10-CM | POA: Diagnosis not present

## 2024-06-11 DIAGNOSIS — R339 Retention of urine, unspecified: Secondary | ICD-10-CM | POA: Insufficient documentation

## 2024-06-11 DIAGNOSIS — N1330 Unspecified hydronephrosis: Secondary | ICD-10-CM | POA: Insufficient documentation

## 2024-06-11 DIAGNOSIS — N3289 Other specified disorders of bladder: Secondary | ICD-10-CM | POA: Insufficient documentation

## 2024-06-14 DIAGNOSIS — K5900 Constipation, unspecified: Secondary | ICD-10-CM | POA: Insufficient documentation

## 2024-06-15 DIAGNOSIS — N5314 Retrograde ejaculation: Secondary | ICD-10-CM | POA: Insufficient documentation

## 2024-06-18 ENCOUNTER — Other Ambulatory Visit: Payer: Self-pay | Admitting: Internal Medicine

## 2024-06-18 DIAGNOSIS — E119 Type 2 diabetes mellitus without complications: Secondary | ICD-10-CM

## 2024-06-30 ENCOUNTER — Inpatient Hospital Stay
Admission: EM | Admit: 2024-06-30 | Discharge: 2024-07-01 | DRG: 177 | Disposition: A | Discharge status: Home / Self Care | Attending: Internal Medicine | Admitting: Internal Medicine

## 2024-06-30 ENCOUNTER — Encounter: Payer: Self-pay | Admitting: Internal Medicine

## 2024-06-30 ENCOUNTER — Other Ambulatory Visit: Payer: Self-pay

## 2024-06-30 ENCOUNTER — Encounter: Admission: EM | Disposition: A | Payer: Self-pay | Source: Home / Self Care | Attending: Internal Medicine

## 2024-06-30 ENCOUNTER — Inpatient Hospital Stay: Admitting: Anesthesiology

## 2024-06-30 ENCOUNTER — Emergency Department

## 2024-06-30 DIAGNOSIS — Z7984 Long term (current) use of oral hypoglycemic drugs: Secondary | ICD-10-CM | POA: Diagnosis not present

## 2024-06-30 DIAGNOSIS — K2100 Gastro-esophageal reflux disease with esophagitis, without bleeding: Secondary | ICD-10-CM | POA: Diagnosis present

## 2024-06-30 DIAGNOSIS — Z7985 Long-term (current) use of injectable non-insulin antidiabetic drugs: Secondary | ICD-10-CM

## 2024-06-30 DIAGNOSIS — E119 Type 2 diabetes mellitus without complications: Secondary | ICD-10-CM | POA: Diagnosis present

## 2024-06-30 DIAGNOSIS — Z803 Family history of malignant neoplasm of breast: Secondary | ICD-10-CM

## 2024-06-30 DIAGNOSIS — I10 Essential (primary) hypertension: Secondary | ICD-10-CM | POA: Diagnosis present

## 2024-06-30 DIAGNOSIS — Z8249 Family history of ischemic heart disease and other diseases of the circulatory system: Secondary | ICD-10-CM | POA: Diagnosis not present

## 2024-06-30 DIAGNOSIS — K209 Esophagitis, unspecified without bleeding: Secondary | ICD-10-CM | POA: Diagnosis present

## 2024-06-30 DIAGNOSIS — Z833 Family history of diabetes mellitus: Secondary | ICD-10-CM

## 2024-06-30 DIAGNOSIS — Z79899 Other long term (current) drug therapy: Secondary | ICD-10-CM

## 2024-06-30 DIAGNOSIS — R1310 Dysphagia, unspecified: Secondary | ICD-10-CM | POA: Diagnosis not present

## 2024-06-30 DIAGNOSIS — J9601 Acute respiratory failure with hypoxia: Secondary | ICD-10-CM | POA: Diagnosis present

## 2024-06-30 DIAGNOSIS — T17320A Food in larynx causing asphyxiation, initial encounter: Principal | ICD-10-CM

## 2024-06-30 DIAGNOSIS — Z791 Long term (current) use of non-steroidal anti-inflammatories (NSAID): Secondary | ICD-10-CM | POA: Diagnosis not present

## 2024-06-30 DIAGNOSIS — T182XXA Foreign body in stomach, initial encounter: Secondary | ICD-10-CM

## 2024-06-30 DIAGNOSIS — R1314 Dysphagia, pharyngoesophageal phase: Secondary | ICD-10-CM | POA: Diagnosis present

## 2024-06-30 DIAGNOSIS — R911 Solitary pulmonary nodule: Secondary | ICD-10-CM | POA: Diagnosis present

## 2024-06-30 DIAGNOSIS — F319 Bipolar disorder, unspecified: Secondary | ICD-10-CM | POA: Diagnosis present

## 2024-06-30 DIAGNOSIS — R1319 Other dysphagia: Secondary | ICD-10-CM | POA: Diagnosis present

## 2024-06-30 DIAGNOSIS — J690 Pneumonitis due to inhalation of food and vomit: Principal | ICD-10-CM | POA: Diagnosis present

## 2024-06-30 DIAGNOSIS — F32A Depression, unspecified: Secondary | ICD-10-CM | POA: Diagnosis present

## 2024-06-30 DIAGNOSIS — Z87891 Personal history of nicotine dependence: Secondary | ICD-10-CM | POA: Diagnosis not present

## 2024-06-30 HISTORY — PX: ESOPHAGOGASTRODUODENOSCOPY: SHX5428

## 2024-06-30 LAB — HIV ANTIBODY (ROUTINE TESTING W REFLEX): HIV Screen 4th Generation wRfx: NONREACTIVE

## 2024-06-30 LAB — BASIC METABOLIC PANEL WITH GFR
Anion gap: 12 (ref 5–15)
BUN: 36 mg/dL — ABNORMAL HIGH (ref 6–20)
CO2: 27 mmol/L (ref 22–32)
Calcium: 9.3 mg/dL (ref 8.9–10.3)
Chloride: 98 mmol/L (ref 98–111)
Creatinine, Ser: 1.2 mg/dL (ref 0.61–1.24)
GFR, Estimated: >60 mL/min (ref >60)
Glucose, Bld: 166 mg/dL — ABNORMAL HIGH (ref 70–99)
Potassium: 3.7 mmol/L (ref 3.5–5.1)
Sodium: 137 mmol/L (ref 135–145)

## 2024-06-30 LAB — CBC WITH DIFFERENTIAL/PLATELET
Abs Immature Granulocytes: 0.03 K/uL (ref 0.00–0.07)
Basophils Absolute: 0.1 K/uL (ref 0.0–0.1)
Basophils Relative: 1 %
Eosinophils Absolute: 0.2 K/uL (ref 0.0–0.5)
Eosinophils Relative: 3 %
HCT: 38.2 % — ABNORMAL LOW (ref 39.0–52.0)
Hemoglobin: 12.3 g/dL — ABNORMAL LOW (ref 13.0–17.0)
Immature Granulocytes: 0 %
Lymphocytes Relative: 14 %
Lymphs Abs: 1 K/uL (ref 0.7–4.0)
MCH: 26.6 pg (ref 26.0–34.0)
MCHC: 32.2 g/dL (ref 30.0–36.0)
MCV: 82.5 fL (ref 80.0–100.0)
Monocytes Absolute: 0.5 K/uL (ref 0.1–1.0)
Monocytes Relative: 6 %
Neutro Abs: 5.5 K/uL (ref 1.7–7.7)
Neutrophils Relative %: 76 %
Platelets: 277 K/uL (ref 150–400)
RBC: 4.63 MIL/uL (ref 4.22–5.81)
RDW: 15.6 % — ABNORMAL HIGH (ref 11.5–15.5)
WBC: 7.3 K/uL (ref 4.0–10.5)
nRBC: 0 % (ref 0.0–0.2)

## 2024-06-30 LAB — GLUCOSE, CAPILLARY
Glucose-Capillary: 132 mg/dL — ABNORMAL HIGH (ref 70–99)
Glucose-Capillary: 159 mg/dL — ABNORMAL HIGH (ref 70–99)
Glucose-Capillary: 170 mg/dL — ABNORMAL HIGH (ref 70–99)
Glucose-Capillary: 187 mg/dL — ABNORMAL HIGH (ref 70–99)

## 2024-06-30 SURGERY — EGD (ESOPHAGOGASTRODUODENOSCOPY)
Anesthesia: General

## 2024-06-30 MED ORDER — ONDANSETRON HCL 4 MG/2ML IJ SOLN: 4.0000 mg | Freq: Four times a day (QID) | INTRAMUSCULAR | Status: DC | PRN

## 2024-06-30 MED ORDER — GABAPENTIN 400 MG PO CAPS
400.0000 mg | ORAL_CAPSULE | Freq: Two times a day (BID) | ORAL | Status: DC
Start: 2024-06-30 — End: 2024-07-01
  Administered 2024-06-30 – 2024-07-01 (×2): 400 mg via ORAL
  Filled 2024-06-30 (×2): qty 1

## 2024-06-30 MED ORDER — ACETAMINOPHEN 325 MG PO TABS
650.0000 mg | ORAL_TABLET | Freq: Four times a day (QID) | ORAL | Status: DC | PRN
  Administered 2024-06-30: 650 mg via ORAL
  Filled 2024-06-30: qty 2

## 2024-06-30 MED ORDER — PROPOFOL 10 MG/ML IV BOLUS
INTRAVENOUS | Status: AC
Start: 2024-06-30 — End: 2024-06-30
  Filled 2024-06-30: qty 20

## 2024-06-30 MED ORDER — SUCCINYLCHOLINE CHLORIDE 200 MG/10ML IV SOSY
PREFILLED_SYRINGE | INTRAVENOUS | Status: DC | PRN
  Administered 2024-06-30: 120 mg via INTRAVENOUS

## 2024-06-30 MED ORDER — OXYCODONE HCL 5 MG PO TABS
5.0000 mg | ORAL_TABLET | Freq: Four times a day (QID) | ORAL | Status: DC | PRN
Start: 2024-06-30 — End: 2024-07-01
  Administered 2024-06-30: 5 mg via ORAL
  Filled 2024-06-30 (×2): qty 1

## 2024-06-30 MED ORDER — CLONAZEPAM 0.5 MG PO TABS
0.5000 mg | ORAL_TABLET | Freq: Three times a day (TID) | ORAL | Status: DC | PRN
  Administered 2024-06-30: 0.5 mg via ORAL
  Filled 2024-06-30: qty 1

## 2024-06-30 MED ORDER — ONDANSETRON HCL 4 MG/2ML IJ SOLN
INTRAMUSCULAR | Status: DC | PRN
  Administered 2024-06-30: 4 mg via INTRAVENOUS

## 2024-06-30 MED ORDER — PROPOFOL 10 MG/ML IV BOLUS
INTRAVENOUS | Status: DC | PRN
  Administered 2024-06-30: 120 mg via INTRAVENOUS

## 2024-06-30 MED ORDER — ENOXAPARIN SODIUM 60 MG/0.6ML IJ SOSY
50.0000 mg | PREFILLED_SYRINGE | INTRAMUSCULAR | Status: DC
  Administered 2024-06-30: 50 mg via SUBCUTANEOUS
  Filled 2024-06-30: qty 0.6

## 2024-06-30 MED ORDER — ONDANSETRON HCL 4 MG PO TABS: 4.0000 mg | ORAL_TABLET | Freq: Four times a day (QID) | ORAL | Status: DC | PRN

## 2024-06-30 MED ORDER — SODIUM CHLORIDE 0.9 % IV SOLN
3.0000 g | Freq: Four times a day (QID) | INTRAVENOUS | Status: DC
  Administered 2024-06-30 – 2024-07-01 (×4): 3 g via INTRAVENOUS
  Filled 2024-06-30 (×6): qty 8

## 2024-06-30 MED ORDER — TAMSULOSIN HCL 0.4 MG PO CAPS
0.4000 mg | ORAL_CAPSULE | Freq: Every day | ORAL | Status: DC
  Administered 2024-06-30: 0.4 mg via ORAL
  Filled 2024-06-30: qty 1

## 2024-06-30 MED ORDER — AMITRIPTYLINE HCL 10 MG PO TABS
10.0000 mg | ORAL_TABLET | Freq: Every day | ORAL | Status: DC
  Administered 2024-06-30: 10 mg via ORAL
  Filled 2024-06-30: qty 1

## 2024-06-30 MED ORDER — RIMEGEPANT SULFATE 75 MG PO TBDP: 75.0000 mg | ORAL_TABLET | Freq: Every day | ORAL | Status: DC | PRN

## 2024-06-30 MED ORDER — PANTOPRAZOLE SODIUM 40 MG PO TBEC
40.0000 mg | DELAYED_RELEASE_TABLET | Freq: Two times a day (BID) | ORAL | Status: DC
  Administered 2024-06-30 – 2024-07-01 (×2): 40 mg via ORAL
  Filled 2024-06-30 (×2): qty 1

## 2024-06-30 MED ORDER — SUCCINYLCHOLINE CHLORIDE 200 MG/10ML IV SOSY
PREFILLED_SYRINGE | INTRAVENOUS | Status: AC
  Filled 2024-06-30: qty 10

## 2024-06-30 MED ORDER — ACETAMINOPHEN 650 MG RE SUPP: 650.0000 mg | Freq: Four times a day (QID) | RECTAL | Status: DC | PRN

## 2024-06-30 MED ORDER — LIDOCAINE HCL (CARDIAC) PF 100 MG/5ML IV SOSY
PREFILLED_SYRINGE | INTRAVENOUS | Status: DC | PRN
  Administered 2024-06-30: 100 mg via INTRAVENOUS

## 2024-06-30 MED ORDER — VALBENAZINE TOSYLATE 60 MG PO CAPS
60.0000 mg | ORAL_CAPSULE | Freq: Every day | ORAL | Status: DC
  Administered 2024-07-01: 60 mg via ORAL
  Filled 2024-06-30: qty 1

## 2024-06-30 MED ORDER — PANTOPRAZOLE SODIUM 40 MG PO TBEC: 40.0000 mg | DELAYED_RELEASE_TABLET | Freq: Every day | ORAL | Status: DC

## 2024-06-30 MED ORDER — ESCITALOPRAM OXALATE 10 MG PO TABS
5.0000 mg | ORAL_TABLET | Freq: Every day | ORAL | Status: DC
  Administered 2024-06-30 – 2024-07-01 (×2): 5 mg via ORAL
  Filled 2024-06-30 (×2): qty 1

## 2024-06-30 MED ORDER — SODIUM CHLORIDE 0.9 % IV SOLN: INTRAVENOUS | Status: DC | PRN

## 2024-06-30 MED ORDER — FENTANYL CITRATE (PF) 100 MCG/2ML IJ SOLN
INTRAMUSCULAR | Status: DC | PRN
  Administered 2024-06-30: 50 ug via INTRAVENOUS

## 2024-06-30 MED ORDER — ATORVASTATIN CALCIUM 20 MG PO TABS
20.0000 mg | ORAL_TABLET | Freq: Every day | ORAL | Status: DC
Start: 2024-06-30 — End: 2024-07-01
  Administered 2024-06-30: 20 mg via ORAL
  Filled 2024-06-30: qty 1

## 2024-06-30 MED ORDER — FENTANYL CITRATE (PF) 100 MCG/2ML IJ SOLN
INTRAMUSCULAR | Status: AC
  Filled 2024-06-30: qty 2

## 2024-06-30 MED ORDER — SODIUM CHLORIDE 0.9 % IV SOLN: INTRAVENOUS | Status: DC

## 2024-06-30 MED ORDER — NYSTATIN 100000 UNIT/ML MT SUSP
5.0000 mL | Freq: Four times a day (QID) | OROMUCOSAL | Status: DC
  Administered 2024-06-30 – 2024-07-01 (×2): 500000 [IU] via ORAL
  Filled 2024-06-30 (×6): qty 5

## 2024-06-30 MED ORDER — DIVALPROEX SODIUM 125 MG PO DR TAB
125.0000 mg | DELAYED_RELEASE_TABLET | Freq: Every day | ORAL | Status: DC
  Administered 2024-06-30: 125 mg via ORAL
  Filled 2024-06-30: qty 1

## 2024-06-30 MED ORDER — SODIUM CHLORIDE 0.9 % IV SOLN
3.0000 g | Freq: Once | INTRAVENOUS | Status: AC
  Administered 2024-06-30: 3 g via INTRAVENOUS
  Filled 2024-06-30: qty 8

## 2024-06-30 MED ORDER — DEXAMETHASONE SOD PHOSPHATE PF 10 MG/ML IJ SOLN
INTRAMUSCULAR | Status: DC | PRN
  Administered 2024-06-30: 10 mg via INTRAVENOUS

## 2024-06-30 MED ORDER — LIDOCAINE HCL (PF) 2 % IJ SOLN
INTRAMUSCULAR | Status: AC
  Filled 2024-06-30: qty 5

## 2024-06-30 NOTE — Consult Note (Signed)
 Micheal Copping, MD [facility]  [address] [place], [place] [address] Phone: [phone] Fax : [phone]  Consultation  Referring Provider:     Dr. Lanetta Primary Care Physician:  Devine, Jaimie, NP Primary Gastroenterologist:  Dr. Aundria         Reason for Consultation:     Dysphagia  Date of Admission:  06/30/2024 Date of Consultation:  06/30/2024         HPI:   Micheal Gay is a 58 y.o. male who had a upper endoscopy by Dr. Aundria at the beginning of this month.  The patient has a history of esophageal stenosis with dilation with a 54 French bougie at that time.  The patient has a history of bipolar disorder diabetes and had an episode of choking at work.  The patient states that multiple people hit him on the back of his chest to try to dislodge the food.  He states that he had eaten an orange and he feels like the orange peel got stuck and he was unable to breathe at that time.  The piece was finally  dislodged and the patient states that he was able to spit up with some blood at that time.  The patient had a CT scan of the chest that showed:  IMPRESSION: 1. Bilateral upper and lower lobe ground-glass opacities with peripheral sparing. Differential considerations include inflammatory and infectious etiologies hypersensitivity pneumonitis, atypical infection, as well as aspiration pneumonitis. 2. 5 mm right lower lobe lung nodule In a patient who was at low risk, no further follow-up is indicated. If the patient is at increased risk a follow-up CT of the chest without contrast material in 12 months may be considered 3. 4.1 cm ascending thoracic aorta. Recommend annual imaging followup by CTA or MRA. This recommendation follows 2010 ACCF/AHA/AATS/ACR/ASA/SCA/SCAI/SIR/STS/SVM Guidelines for the Diagnosis and Management of Patients with Thoracic Aortic Disease. Circulation. 2010; 121: Z733-z630. Aortic aneurysm NOS (ICD10-I71.9)  The patient had a normal white cell  count with a hemoglobin of 12.3 on admission with a hematocrit of 38.2.  The patient's biopsies on his EGD earlier this month showed findings consistent with Barrett's esophagus and reflux.  The patient was also noted to have iron deficiency anemia and his blood work from September with a iron saturation of 9 with a iron of 39.  Past Medical History:  Diagnosis Date   Bipolar 1 disorder (HCC)    Depression    Diabetes mellitus without complication (HCC)    Keratoconus of right eye    Sepsis (HCC) 09/22/2016   Septicemia (HCC) 09/22/2016    Past Surgical History:  Procedure Laterality Date   EYE SURGERY      Prior to Admission medications   Medication Sig Start Date End Date Taking? Authorizing Provider  amitriptyline  (ELAVIL ) 10 MG tablet TAKE 1 TABLET BY MOUTH AT BEDTIME 09/08/23  Yes Tejan-Sie, GORMAN Dine, MD  atorvastatin  (LIPITOR) 20 MG tablet Take 1 tablet by mouth once daily 05/17/24  Yes Tejan-Sie, S Ahmed, MD  clonazePAM (KLONOPIN) 0.5 MG tablet Take 0.5 mg by mouth 3 (three) times daily as needed. 06/25/23  Yes [provider]  divalproex (DEPAKOTE) 125 MG DR tablet Take 125 mg by mouth at bedtime. 05/25/24  Yes [provider]  escitalopram  (LEXAPRO ) 5 MG tablet Take 1 tablet by mouth once daily 05/17/24  Yes Tejan-Sie, S Ahmed, MD  gabapentin (NEURONTIN) 400 MG capsule Take 400 mg by mouth 2 (two) times  daily. 04/29/23  Yes [provider]  indomethacin (INDOCIN) 50 MG capsule TAKE 1 CAPSULE BY MOUTH THREE TIMES DAILY AS NEEDED FOR HEADACHE 11/25/22  Yes Tejan-Sie, GORMAN Dine, MD  JARDIANCE  25 MG TABS tablet TAKE 1 TABLET BY MOUTH ONCE DAILY BEFORE BREAKFAST 06/18/24  Yes Tejan-Sie, GORMAN Dine, MD  meloxicam (MOBIC) 15 MG tablet Take 15 mg by mouth daily. 09/01/23  Yes [provider]  metFORMIN (GLUCOPHAGE) 1000 MG tablet Take 1 tablet by mouth twice daily 05/07/24  Yes Tejan-Sie, S Ahmed, MD  MOUNJARO 2.5 MG/0.5ML Pen Inject 2.5 mg into the skin once a  week. 06/14/24  Yes [provider]  nortriptyline (PAMELOR) 10 MG capsule Take by mouth. 10/04/23  Yes [provider]  nystatin  (MYCOSTATIN ) 100000 UNIT/ML suspension Take 5 mLs (500,000 Units total) by mouth 4 (four) times daily. 10/10/23  Yes Tejan-Sie, GORMAN Dine, MD  ondansetron  (ZOFRAN -ODT) 4 MG disintegrating tablet DISSOLVE 1 TABLET IN MOUTH EVERY 8 HOURS AS NEEDED FOR NAUSEA FOR VOMITING 02/03/23  Yes Orlean Alan HERO, FNP  pantoprazole  (PROTONIX ) 40 MG tablet Take 40 mg by mouth daily. 08/03/23  Yes [provider]  pioglitazone  (ACTOS ) 45 MG tablet TAKE 1 TABLET BY MOUTH IN THE MORNING 06/09/24  Yes Tejan-Sie, GORMAN Dine, MD  Rimegepant Sulfate (NURTEC) 75 MG TBDP Take 1 tablet (75 mg total) by mouth daily as needed (headache). 12/23/22  Yes Orlean Alan HERO, FNP  sildenafil (VIAGRA) 100 MG tablet TAKE ONE-HALF TO ONE TABLET BY MOUTH 30 MINUTES TO 4 HOURS BEFORE INTERCOURSE AS DIRECTED 09/01/23  Yes Tejan-Sie, GORMAN Dine, MD  tamsulosin  (FLOMAX ) 0.4 MG CAPS capsule TAKE 1 CAPSULE BY MOUTH ONCE DAILY AFTER BREAKFAST 05/17/24  Yes Tejan-Sie, GORMAN Dine, MD  Zavegepant HCl (ZAVZPRET ) 10 MG/ACT SOLN Place 1 spray into the nose daily. 12/30/22  Yes Orlean Alan HERO, FNP    Family History  Problem Relation Age of Onset   Diabetes Mother    Breast cancer Mother    Heart attack Father    Prostate cancer Neg Hx    Kidney cancer Neg Hx    Bladder Cancer Neg Hx      Social History   Tobacco Use   Smoking status: Never   Smokeless tobacco: Former    Types: Chew    Quit date: 09/09/1982  Substance Use Topics   Alcohol use: No   Drug use: No    Allergies as of 06/30/2024 - Review Complete 06/30/2024  Allergen Reaction Noted   Other  10/29/2015   Pollen extract  10/29/2015    Review of Systems:    All systems reviewed and negative except where noted in HPI.   Physical Exam:  Vital signs in last 24 hours: Temp:  [97.5 F (36.4 C)-97.8 F (36.6 C)] 97.5 F (36.4 C)  (10/22 0745) Pulse Rate:  [73-91] 73 (10/22 0745) Resp:  [16-18] 17 (10/22 0745) BP: (91-151)/(48-83) 118/79 (10/22 0745) SpO2:  [89 %-96 %] 96 % (10/22 0745) Weight:  [104 kg] 104 kg (10/22 0258)   General:   Pleasant, cooperative in NAD Head:  Normocephalic and atraumatic. Eyes:   No icterus.   Conjunctiva pink. PERRLA. Ears:  Normal auditory acuity. Neck:  Supple; no masses or thyroidomegaly Lungs: Respirations even and unlabored. Lungs clear to auscultation bilaterally.   No wheezes, crackles, or rhonchi.  Heart:  Regular rate and rhythm;  Without murmur, clicks, rubs or gallops Abdomen:  Soft, nondistended, nontender. Normal bowel sounds. No appreciable masses or hepatomegaly.  No rebound or guarding.  Rectal:  Not performed. Msk:  Symmetrical without gross deformities.    Extremities:  Without edema, cyanosis or clubbing. Neurologic:  Alert and oriented x3;  grossly normal neurologically. Skin:  Intact without significant lesions or rashes. Cervical Nodes:  No significant cervical adenopathy. Psych:  Alert and cooperative. Normal affect.  LAB RESULTS: Recent Labs    06/30/24 0311  WBC 7.3  HGB 12.3*  HCT 38.2*  PLT 277   BMET Recent Labs    06/30/24 0311  NA 137  K 3.7  CL 98  CO2 27  GLUCOSE 166*  BUN 36*  CREATININE 1.20  CALCIUM  9.3   LFT No results for input(s): PROT, ALBUMIN, AST, ALT, ALKPHOS, BILITOT, BILIDIR, IBILI in the last 72 hours. PT/INR No results for input(s): LABPROT, INR in the last 72 hours.  STUDIES: CT Chest Wo Contrast Result Date: 06/30/2024 EXAM: CT CHEST WITHOUT CONTRAST 06/30/2024 04:41:58 AM TECHNIQUE: CT of the chest was performed without the administration of intravenous contrast. Multiplanar reformatted images are provided for review. Automated exposure control, iterative reconstruction, and/or weight based adjustment of the mA/kV was utilized to reduce the radiation dose to as low as reasonably achievable.  COMPARISON: None available. CLINICAL HISTORY: Choking aspiration foreign body? Past medical history of bipolar, depression, diabetes, esophageal stenosis, presents with choking episode from work at [employer] today. He was eating an orange and orange peel got stuck in his throat, he had difficulty breathing and a Heimlich maneuver were performed by coworkers after which he dislodged an orange peel and felt better. He was noted to be hypoxemic and requiring oxygen. He still has a vague discomfort in his chest. FINDINGS: MEDIASTINUM: Heart and pericardium are unremarkable. The trachea appears patent and is midline. The central airways are clear. Normal appearance of the esophagus. The ascending thoracic aorta measures 4.1 cm. LYMPH NODES: No mediastinal, hilar or axillary lymphadenopathy. LUNGS AND PLEURA: Bilateral upper and lower lobes ground glass opacities with sparing of the peripheral lung fields. A nodule within the posterolateral right lower lobe measures 5 mm (image 106/4). No pleural effusion, consolidative change, or pneumothorax. SOFT TISSUES/BONES: No acute abnormality of the bones or soft tissues. UPPER ABDOMEN: Parapelvic cyst versus hydronephrosis noted within the upper pole of the left kidney, incompletely characterized due to limited imaging through the upper abdomen. IMPRESSION: 1. Bilateral upper and lower lobe ground-glass opacities with peripheral sparing. Differential considerations include inflammatory and infectious etiologies hypersensitivity pneumonitis, atypical infection, as well as aspiration pneumonitis. 2. 5 mm right lower lobe lung nodule In a patient who was at low risk, no further follow-up is indicated. If the patient is at increased risk a follow-up CT of the chest without contrast material in 12 months may be considered 3. 4.1 cm ascending thoracic aorta. Recommend annual imaging followup by CTA or MRA. This recommendation follows 2010 ACCF/AHA/AATS/ACR/ASA/SCA/SCAI/SIR/STS/SVM  Guidelines for the Diagnosis and Management of Patients with Thoracic Aortic Disease. Circulation. 2010; 121: Z733-z630. Aortic aneurysm NOS (ICD10-I71.9) Electronically signed by: Waddell Calk MD 06/30/2024 05:17 AM EDT RP Workstation: [id number]   DG Chest 1 View Result Date: 06/30/2024 CLINICAL DATA:  Recent choking episode with subsequent Heimlich maneuver EXAM: PORTABLE CHEST 1 VIEW COMPARISON:  12/21/2022 FINDINGS: Cardiac shadow is within normal limits. Lungs are well aerated bilaterally. Mild vascular congestion is noted with mild edematous changes. No focal confluent infiltrate or effusion is seen. No bony abnormality is noted. IMPRESSION: Mild changes of parenchymal edema and vascular congestion. Electronically Signed   By:  Oneil Devonshire M.D.   On: 06/30/2024 03:48      Impression / Plan:   Assessment: Principal Problem:   Aspiration pneumonitis (HCC)   ZYLER HYSON is a 58 y.o. y/o male with dysphagia and a history of esophageal stricture with a dilation of the esophagus earlier this month by Dr. Aundria at [facility] surgical center.  The biopsies of the esophagus at that time showed Barrett's esophagus and reflux esophagitis.  The patient now presented with food getting stuck at work which he reported to have caused some shortness of breath and needed coworkers to do the Heimlich maneuver and hit him on the back of the chest to dislodge it.  The patient reports that is feeling much better at this time.  Plan:  The patient will be set up for an EGD for today to see if there is anything that may be causing the patient's dysphagia at this time.  The patient states he would like to start eating but is willing to undergo the upper endoscopy prior to eating.  The patient has been explained the plan and agrees with it.  Thank you for involving me in the care of this patient.      LOS: 0 days   Micheal Copping, MD, MD. NOLIA 06/30/2024, 9:31 AM,  Pager [phone] 7am-5pm  Check AMION  for 5pm -7am coverage and on weekends   Note: This dictation was prepared with Dragon dictation along with smaller phrase technology. Any transcriptional errors that result from this process are unintentional.

## 2024-06-30 NOTE — Assessment & Plan Note (Signed)
 Patient has a known history of esophageal stenosis and dysphagia and is status post recent dilatation. Presents to the ER for evaluation after a choking episode at work We will consult GI for further evaluation

## 2024-06-30 NOTE — Anesthesia Procedure Notes (Signed)
 Procedure Name: Intubation Date/Time: 06/30/2024 10:18 AM  Performed by: Jackye Spanner, CRNAPre-anesthesia Checklist: Patient identified, Patient being monitored, Timeout performed, Emergency Drugs available and Suction available Patient Re-evaluated:Patient Re-evaluated prior to induction Oxygen Delivery Method: Circle system utilized Preoxygenation: Pre-oxygenation with 100% oxygen Induction Type: IV induction, Rapid sequence and Cricoid Pressure applied Laryngoscope Size: 3 and McGrath Grade View: Grade I Tube type: Oral Tube size: 7.0 mm Number of attempts: 1 Airway Equipment and Method: Stylet Placement Confirmation: ETT inserted through vocal cords under direct vision, positive ETCO2 and breath sounds checked- equal and bilateral Secured at: 22 cm Tube secured with: Tape Dental Injury: Teeth and Oropharynx as per pre-operative assessment  Comments: Smooth atraumatic intubation, no complications noted

## 2024-06-30 NOTE — Progress Notes (Signed)
 Patient requested prayer but was unavailable due to testing. Will forward request for Chaplain to follow up.   Latiffany Harwick Chaplain Intern

## 2024-06-30 NOTE — Anesthesia Postprocedure Evaluation (Signed)
 Anesthesia Post Note  Patient: Micheal Gay  Procedure(s) Performed: EGD (ESOPHAGOGASTRODUODENOSCOPY)  Patient location during evaluation: Endoscopy Anesthesia Type: General Level of consciousness: awake and alert Pain management: pain level controlled Vital Signs Assessment: post-procedure vital signs reviewed and stable Respiratory status: spontaneous breathing, nonlabored ventilation and respiratory function stable Cardiovascular status: blood pressure returned to baseline and stable Postop Assessment: no apparent nausea or vomiting Anesthetic complications: no   No notable events documented.   Last Vitals:  Vitals:   06/30/24 1055 06/30/24 1124  BP: 111/75 132/79  Pulse: 74 70  Resp: 15 16  Temp:  37.1 C  SpO2: 94% 92%    Last Pain:  Vitals:   06/30/24 1124  TempSrc: Oral  PainSc:                  Fairy POUR Alfreda Hammad

## 2024-06-30 NOTE — H&P (Signed)
 History and Physical    Patient: Micheal Gay FMW:[id number] DOB: [birth year] DOA: 06/30/2024 DOS: the patient was seen and examined on 06/30/2024 PCP: Devine, Jaimie, NP  Patient coming from: Home  Chief Complaint:  Chief Complaint  Patient presents with   Choking   HPI: Micheal Gay is a 58 y.o. male with medical history significant for diabetes mellitus, bipolar disorder, depression, esophageal stenosis s/p recent dilatation, history of dysphagia who presents to the emergency room via EMS after he had a witnessed choking episode while at work.  He was eating an orange and states that it got stuck in his throat and he developed difficulty breathing.  He his coworkers attempted Heimlich maneuver with dislodgment of a piece of the fruit.  EMS was called due to vague chest discomfort and he was noted to be hypoxemic requiring oxygen supplementation at 2 L to maintain pulse oximetry greater than 92%. Upon arrival to the ER he was noted to be speaking in full sentences and appeared comfortable.  Room air pulse oximetry was 80% requiring oxygen supplementation.  Imaging was concerning for aspiration pneumonia and so admission was requested for acute hypoxic respiratory failure secondary to aspiration pneumonia. He denies having any fever, no cough, no headache, no blurred vision, no dizziness, no lightheadedness, no abdominal pain, no changes in his bowel habits, no urinary symptoms, no focal deficit Abnormal labs include glucose of 166 CT scan of the chest without contrast shows bilateral upper and lower lobe ground-glass opacities with peripheral sparing. Differential considerations include inflammatory and infectious etiologies hypersensitivity pneumonitis, atypical infection, as well as aspiration pneumonitis. 5 mm right lower lobe lung nodule In a patient who was at low risk, no further follow-up is indicated. If the patient is at increased risk a follow-up CT of the chest without contrast  material in 12 months may be considered. 4.1 cm ascending thoracic aorta. Recommend annual imaging followup by CTA or MRA. This recommendation follows 2010 Patient received a dose of Unasyn and will be admitted to the hospital for further evaluation.    Review of Systems: As mentioned in the history of present illness. All other systems reviewed and are negative. Past Medical History:  Diagnosis Date   Bipolar 1 disorder (HCC)    Depression    Diabetes mellitus without complication (HCC)    Keratoconus of right eye    Sepsis (HCC) 09/22/2016   Septicemia (HCC) 09/22/2016   Past Surgical History:  Procedure Laterality Date   ESOPHAGOGASTRODUODENOSCOPY N/A 06/30/2024   Procedure: EGD (ESOPHAGOGASTRODUODENOSCOPY);  Surgeon: Jinny Carmine, MD;  Location: [facility] ENDOSCOPY;  Service: Endoscopy;  Laterality: N/A;   EYE SURGERY     Social History:  reports that he has never smoked. He quit smokeless tobacco use about 41 years ago.  His smokeless tobacco use included chew. He reports that he does not drink alcohol and does not use drugs.  Allergies  Allergen Reactions   Other     Animal Dandruff Hay     Pollen Extract     Family History  Problem Relation Age of Onset   Diabetes Mother    Breast cancer Mother    Heart attack Father    Prostate cancer Neg Hx    Kidney cancer Neg Hx    Bladder Cancer Neg Hx     Prior to Admission medications   Medication Sig Start Date End Date Taking? Authorizing Provider  amitriptyline  (ELAVIL ) 10 MG tablet TAKE 1 TABLET BY MOUTH AT BEDTIME 09/08/23  Yes  [name], MD  atorvastatin  (LIPITOR) 20 MG tablet Take 1 tablet by mouth once daily 05/17/24  Yes Tejan-Sie, GORMAN Dine, MD  clonazePAM (KLONOPIN) 0.5 MG tablet Take 0.5 mg by mouth 3 (three) times daily as needed. 06/25/23  Yes [provider]  divalproex (DEPAKOTE) 125 MG DR tablet Take 125 mg by mouth at bedtime. 05/25/24  Yes [provider]  escitalopram  (LEXAPRO ) 5 MG  tablet Take 1 tablet by mouth once daily 05/17/24  Yes Tejan-Sie, S Ahmed, MD  gabapentin (NEURONTIN) 400 MG capsule Take 400 mg by mouth 2 (two) times daily. 04/29/23  Yes [provider]  indomethacin (INDOCIN) 50 MG capsule TAKE 1 CAPSULE BY MOUTH THREE TIMES DAILY AS NEEDED FOR HEADACHE 11/25/22  Yes Tejan-Sie, GORMAN Dine, MD  JARDIANCE  25 MG TABS tablet TAKE 1 TABLET BY MOUTH ONCE DAILY BEFORE BREAKFAST 06/18/24  Yes Tejan-Sie, GORMAN Dine, MD  meloxicam (MOBIC) 15 MG tablet Take 15 mg by mouth daily. 09/01/23  Yes [provider]  metFORMIN (GLUCOPHAGE) 1000 MG tablet Take 1 tablet by mouth twice daily 05/07/24  Yes Tejan-Sie, S Ahmed, MD  MOUNJARO 2.5 MG/0.5ML Pen Inject 2.5 mg into the skin once a week. 06/14/24  Yes [provider]  nortriptyline (PAMELOR) 10 MG capsule Take by mouth. 10/04/23  Yes [provider]  nystatin  (MYCOSTATIN ) 100000 UNIT/ML suspension Take 5 mLs (500,000 Units total) by mouth 4 (four) times daily. 10/10/23  Yes [name], MD  ondansetron  (ZOFRAN -ODT) 4 MG disintegrating tablet DISSOLVE 1 TABLET IN MOUTH EVERY 8 HOURS AS NEEDED FOR NAUSEA FOR VOMITING 02/03/23  Yes Orlean Alan HERO, FNP  pantoprazole  (PROTONIX ) 40 MG tablet Take 40 mg by mouth daily. 08/03/23  Yes [provider]  pioglitazone  (ACTOS ) 45 MG tablet TAKE 1 TABLET BY MOUTH IN THE MORNING 06/09/24  Yes Tejan-Sie, GORMAN Dine, MD  Rimegepant Sulfate (NURTEC) 75 MG TBDP Take 1 tablet (75 mg total) by mouth daily as needed (headache). 12/23/22  Yes Orlean Alan HERO, FNP  sildenafil (VIAGRA) 100 MG tablet TAKE ONE-HALF TO ONE TABLET BY MOUTH 30 MINUTES TO 4 HOURS BEFORE INTERCOURSE AS DIRECTED 09/01/23  Yes Tejan-Sie, GORMAN Dine, MD  tamsulosin  (FLOMAX ) 0.4 MG CAPS capsule TAKE 1 CAPSULE BY MOUTH ONCE DAILY AFTER BREAKFAST 05/17/24  Yes Tejan-Sie, GORMAN Dine, MD  valbenazine (INGREZZA) 60 MG capsule Take 60 mg by mouth daily. Is supposed to increase to 70mg  every day next week    Yes [provider]  Zavegepant HCl (ZAVZPRET ) 10 MG/ACT SOLN Place 1 spray into the nose daily. 12/30/22  Yes Orlean Alan HERO, FNP    Physical Exam: Vitals:   06/30/24 1045 06/30/24 1055 06/30/24 1124 06/30/24 1600  BP: 107/75 111/75 132/79 (!) 140/77  Pulse: 75 74 70 73  Resp: 12 15 16 16   Temp:   98.7 F (37.1 C) 97.7 F (36.5 C)  TempSrc:   Oral Oral  SpO2: 99% 94% 92% 94%  Weight:      Height:       Physical Exam Vitals and nursing note reviewed.  Constitutional:      Appearance: Normal appearance.  HENT:     Head: Normocephalic and atraumatic.     Nose: Nose normal.     Mouth/Throat:     Mouth: Mucous membranes are moist.  Eyes:     Conjunctiva/sclera: Conjunctivae normal.  Cardiovascular:     Rate and Rhythm: Normal rate and regular rhythm.  Pulmonary:     Effort:  Pulmonary effort is normal.     Breath sounds: Rhonchi present.     Comments: Scattered rhonchi Abdominal:     General: Abdomen is flat. Bowel sounds are normal.     Palpations: Abdomen is soft.  Musculoskeletal:        General: Normal range of motion.     Cervical back: Normal range of motion and neck supple.  Skin:    General: Skin is warm and dry.  Neurological:     General: No focal deficit present.     Mental Status: He is alert and oriented to person, place, and time.  Psychiatric:        Mood and Affect: Mood normal.        Behavior: Behavior normal.     Data Reviewed: Data Reviewed: Relevant notes from primary care and specialist visits, past discharge summaries as available in EHR, including Care Everywhere. Prior diagnostic testing as pertinent to current admission diagnoses Updated medications and problem lists for reconciliation ED course, including vitals, labs, imaging, treatment and response to treatment Triage notes, nursing and pharmacy notes and ED provider's notes Notable results as noted in HPI Sodium 137, potassium 3.7, chloride 98, bicarb 27, glucose 166, BUN  36, creatinine 1.20, calcium  9.3, white count 7.3, hemoglobin 12.3, hematocrit 38.2, platelet count 277   Labs reviewed  Assessment and Plan: * Aspiration pneumonitis (HCC) Secondary to choking episode related to known esophageal dysphagia Continue Unasyn   Acute hypoxic respiratory failure (HCC) Secondary to aspiration pneumonia Patient had room air pulse oximetry in the 80's requiring oxygen supplementation at 2 L to maintain pulse oximetry greater than 92% Will attempt to wean patient off oxygen as tolerated  Esophageal dysphagia Patient has a known history of esophageal stenosis and dysphagia and is status post recent dilatation. Presents to the ER for evaluation after a choking episode at work We will consult GI for further evaluation  Depression Continue Lexapro   Type 2 diabetes mellitus without complication, without long-term current use of insulin (HCC) Maintain consistent carbohydrate diet Sliding scale insulin with Accu-Cheks AC and at bedtime  Choking due to food (regurgitated), initial encounter Treatment as outlined in 3  Bipolar I disorder (HCC) Continue Depakote, Ingrezza and Klonopin      Advance Care Planning:   Code Status: Full Code   Consults: Gastroenterology  Family Communication: Plan of care discussed with patient at the bedside.  Severity of Illness: The appropriate patient status for this patient is INPATIENT. Inpatient status is judged to be reasonable and necessary in order to provide the required intensity of service to ensure the patient's safety. The patient's presenting symptoms, physical exam findings, and initial radiographic and laboratory data in the context of their chronic comorbidities is felt to place them at high risk for further clinical deterioration. Furthermore, it is not anticipated that the patient will be medically stable for discharge from the hospital within 2 midnights of admission.   * I certify that at the point of  admission it is my clinical judgment that the patient will require inpatient hospital care spanning beyond 2 midnights from the point of admission due to high intensity of service, high risk for further deterioration and high frequency of surveillance required.*  Author: Aimee Somerset, MD 06/30/2024 4:34 PM  For on call review [email or web address].

## 2024-06-30 NOTE — Progress Notes (Signed)
 The patient has been told that he needs to chew his food better since there were unchewed slices of mandarin orange in his stomach.  He also has severe esophagitis likely from his dilation and reflux.  There is no obstruction seen.  The patient should follow-up with Dr. Aundria as an outpatient.  Nothing further to do from GI point of view.  I will sign off.  Please call if any further GI concerns or questions.  We would like to thank you for the opportunity to participate in the care of Micheal Gay.

## 2024-06-30 NOTE — Assessment & Plan Note (Signed)
-  Continue Lexapro

## 2024-06-30 NOTE — ED Notes (Signed)
Patient taken for CT

## 2024-06-30 NOTE — Transfer of Care (Signed)
 Immediate Anesthesia Transfer of Care Note  Patient: Micheal Gay  Procedure(s) Performed: EGD (ESOPHAGOGASTRODUODENOSCOPY)  Patient Location: Endoscopy Unit  Anesthesia Type:General  Level of Consciousness: awake, alert , and oriented  Airway & Oxygen Therapy: Patient Spontanous Breathing  Post-op Assessment: Report given to RN and Post -op Vital signs reviewed and stable  Post vital signs: Reviewed and stable  Last Vitals:  Vitals Value Taken Time  BP 115/74 1035  Temp 35.8 1035  Pulse 79 1035  Resp 20 1035  SpO2 98 1035    Last Pain:  Vitals:   06/30/24 0259  TempSrc: Oral  PainSc:          Complications: No notable events documented.

## 2024-06-30 NOTE — Assessment & Plan Note (Signed)
Treatment as outlined in 3 

## 2024-06-30 NOTE — Assessment & Plan Note (Signed)
 Continue Depakote, Ingrezza and Klonopin

## 2024-06-30 NOTE — ED Triage Notes (Signed)
 Patient brought in via [facility] from [employer] break room. Patient choked on an orange slice. Co workers performed heimlich maneuver without anything coming up. On EMS arrival patient 02 sat 86% with increased work of breathing. They administered duoneb and some oxygen. Patient states orange slice eventually did come up after some back slaps. Previously has had esophagus stretched and supposed to have neck surgery tomorrow.

## 2024-06-30 NOTE — ED Provider Notes (Addendum)
 [facility] Provider Note    Event Date/Time   First MD Initiated Contact with Patient 06/30/24 0254     (approximate)   History   Choking   HPI  Micheal Gay is a 58 y.o. male   Past medical history of bipolar, depression, diabetes, esophageal stenosis, presents with choking episode from work at [employer] today.  He was eating an orange and orange peel got stuck in his throat, he had difficulty breathing and a Heimlich maneuver and\were performed by coworkers after which he dislodged and orange peel and felt better.  He was noted to be hypoxemic and requiring oxygen.  He still has a vague discomfort in his chest.  He feels much better now.  Speaking full sentences and breathing more comfortably.  Requires new oxygen as his oxygen dipped into the 80% on room air.  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  Is not a new issue for him.  He is a non-smoker.  External Medical Documents Reviewed: Prior outpatient notes -noted history of difficulty swallowing      Physical Exam   Triage Vital Signs: ED Triage Vitals  Encounter Vitals Group     BP 06/30/24 0259 (!) 151/82     Girls Systolic BP Percentile --      Girls Diastolic BP Percentile --      Boys Systolic BP Percentile --      Boys Diastolic BP Percentile --      Pulse Rate 06/30/24 0259 91     Resp 06/30/24 0259 18     Temp 06/30/24 0259 97.8 F (36.6 C)     Temp Source 06/30/24 0259 Oral     SpO2 06/30/24 0259 (!) 89 %     Weight 06/30/24 0258 229 lb 4.5 oz (104 kg)     Height 06/30/24 0258 5' 11 (1.803 m)     Head Circumference --      Peak Flow --      Pain Score 06/30/24 0257 10     Pain Loc --      Pain Education --      Exclude from Growth Chart --     Most recent vital signs: Vitals:   06/30/24 0315 06/30/24 0445  BP:  106/83   Pulse: 88 84  Resp: 18 16  Temp:    SpO2: 93% 96%    General: Awake, no distress.  CV:  Good peripheral perfusion.  Resp:  Normal effort.  Abd:  No distention.  Other:  Hypoxemic 89% on room air improved significantly to the mid 90s on nasal cannula oxygen.  Lungs without any obvious focality or wheezing, good air movement throughout.  Speaking full sentences no respiratory distress.   ED Results / Procedures / Treatments   Labs (all labs ordered are listed, but only abnormal results are displayed) Labs Reviewed  BASIC METABOLIC PANEL WITH GFR - Abnormal; Notable for the following components:      Result Value   Glucose, Bld 166 (*)    BUN 36 (*)    All other components within normal limits  CBC WITH DIFFERENTIAL/PLATELET - Abnormal; Notable for the following components:   Hemoglobin 12.3 (*)    HCT 38.2 (*)    RDW 15.6 (*)    All other components within normal limits     I ordered  and reviewed the above labs they are notable for cell counts electrolytes largely unremarkable.   RADIOLOGY I independently reviewed and interpreted chest x-ray and I see no obvious focality pneumothorax I also reviewed radiologist's formal read.   PROCEDURES:  Critical Care performed: Yes, see critical care procedure note(s)  .Critical Care  Performed by: Cyrena Mylar, MD Authorized by: Cyrena Mylar, MD   Critical care provider statement:    Critical care time (minutes):  30   Critical care was time spent personally by me on the following activities:  Development of treatment plan with patient or surrogate, discussions with consultants, evaluation of patient's response to treatment, examination of patient, ordering and review of laboratory studies, ordering and review of radiographic studies, ordering and performing treatments and interventions, pulse oximetry, re-evaluation of patient's condition and review of old charts    MEDICATIONS ORDERED IN ED: Medications  Ampicillin-Sulbactam  (UNASYN) 3 g in sodium chloride  0.9 % 100 mL IVPB (has no administration in time range)    External physician / consultants:  I spoke with [facility] ICU pulm consultant regarding care plan for this patient.   IMPRESSION / MDM / ASSESSMENT AND PLAN / ED COURSE  I reviewed the triage vital signs and the nursing notes.                                Patient's presentation is most consistent with acute presentation with potential threat to life or bodily function.  Differential diagnosis includes, but is not limited to, choking, dysphagia, foreign body aspiration, pneumonitis pneumonia   The patient is on the cardiac monitor to evaluate for evidence of arrhythmia and/or significant heart rate changes.  MDM:    New oxygen requirement after choking episode, there was an orange peel to dislodge he feels markedly better although with still some discomfort in the chest and hypoxemia, will get a chest x-ray basic labs and likely admission.  Spoke with ICU consulted regarding need for bronchoscopy or CT chest, recommend CT chest.  If negative plan will be for admission given his aspiration event and hypoxemia.  No obvious foreign bodies noted on CT chest, with evidence of bilateral upper and lower lobe ground glass opacities which may represent aspiration pneumonitis.  Will give a dose of IV Unasyn.  Admission given his event plus hypoxemia.      FINAL CLINICAL IMPRESSION(S) / ED DIAGNOSES   Final diagnoses:  Choking due to food (regurgitated), initial encounter  Acute hypoxemic respiratory failure (HCC)  Aspiration pneumonitis (HCC)     Rx / DC Orders   ED Discharge Orders     None        Note:  This document was prepared using Dragon voice recognition software and may include unintentional dictation errors.    Cyrena Mylar, MD 06/30/24 9640    Cyrena Mylar, MD 06/30/24 [id number]

## 2024-06-30 NOTE — Assessment & Plan Note (Signed)
 Maintain consistent carbohydrate diet Sliding scale insulin with Accu-Cheks AC and at bedtime

## 2024-06-30 NOTE — Op Note (Signed)
 [facility] Patient Name: Micheal Gay Procedure Date: 06/30/2024 9:41 AM MRN: [id number] Account #: [id number] Date of Birth: Dec 09, 1965 Admit Type: Outpatient Age: 58 Room: [facility] ENDO ROOM 1 Gender: Male Note Status: Finalized Instrument Name: Upper GI Scope [id number] Procedure:             Upper GI endoscopy Indications:           Dysphagia Providers:             Rogelia Copping MD, MD Medicines:             General Anesthesia Complications:         No immediate complications. Procedure:             Pre-Anesthesia Assessment:                        - Prior to the procedure, a History and Physical was                         performed, and patient medications and allergies were                         reviewed. The patient's tolerance of previous                         anesthesia was also reviewed. The risks and benefits                         of the procedure and the sedation options and risks                         were discussed with the patient. All questions were                         answered, and informed consent was obtained. Prior                         Anticoagulants: The patient has taken no anticoagulant                         or antiplatelet agents. ASA Grade Assessment: II - A                         patient with mild systemic disease. After reviewing                         the risks and benefits, the patient was deemed in                         satisfactory condition to undergo the procedure.                        After obtaining informed consent, the endoscope was                         passed under direct vision. Throughout the procedure,                         the patient's blood pressure,  pulse, and oxygen                         saturations were monitored continuously. The Endoscope                         was introduced through the mouth, and advanced to the                         second part of duodenum. The upper  GI endoscopy was                         accomplished without difficulty. The patient tolerated                         the procedure well. Findings:      LA Grade D (one or more mucosal breaks involving at least 75% of       esophageal circumference) esophagitis with no bleeding was found in the       lower third of the esophagus.      Food (residue) was found in the entire examined stomach.      The examined duodenum was normal. Impression:            - LA Grade D reflux esophagitis with no bleeding.                        - Food (residue) in the stomach.                        - Normal examined duodenum.                        - No specimens collected. Recommendation:        - Return patient to hospital ward for ongoing care.                        - Resume previous diet.                        - Continue present medications. Procedure Code(s):     --- Professional ---                        [phone], Esophagogastroduodenoscopy, flexible,                         transoral; diagnostic, including collection of                         specimen(s) by brushing or washing, when performed                         (separate procedure) Diagnosis Code(s):     --- Professional ---                        R13.10, Dysphagia, unspecified                        K21.00, Gastro-esophageal reflux disease with  esophagitis, without bleeding CPT copyright 2022 American Medical Association. All rights reserved. The codes documented in this report are preliminary and upon coder review may  be revised to meet current compliance requirements. Rogelia Copping MD, MD 06/30/2024 10:40:10 AM This report has been signed electronically. Number of Addenda: 0 Note Initiated On: 06/30/2024 9:41 AM Estimated Blood Loss:  Estimated blood loss: none.      [facility]

## 2024-06-30 NOTE — Assessment & Plan Note (Signed)
 Secondary to aspiration pneumonia Patient had room air pulse oximetry in the 80's requiring oxygen supplementation at 2 L to maintain pulse oximetry greater than 92% Will attempt to wean patient off oxygen as tolerated

## 2024-06-30 NOTE — ED Notes (Signed)
 Patient returned from CT at this time. Reconnected to monitor, VSS, call light within reach.

## 2024-06-30 NOTE — Assessment & Plan Note (Addendum)
 Secondary to choking episode related to known esophageal dysphagia Continue Unasyn

## 2024-06-30 NOTE — Evaluation (Signed)
 Clinical/Bedside Swallow Evaluation Patient Details  Name: Micheal Gay MRN: [id number] Date of Birth: December 05, 1965  Today's Date: 06/30/2024 Time: SLP Start Time (ACUTE ONLY): 1125 SLP Stop Time (ACUTE ONLY): 1225 SLP Time Calculation (min) (ACUTE ONLY): 60 min  Past Medical History:  Past Medical History:  Diagnosis Date   Bipolar 1 disorder (HCC)    Depression    Diabetes mellitus without complication (HCC)    Keratoconus of right eye    Sepsis (HCC) 09/22/2016   Septicemia (HCC) 09/22/2016   Past Surgical History:  Past Surgical History:  Procedure Laterality Date   ESOPHAGOGASTRODUODENOSCOPY N/A 06/30/2024   Procedure: EGD (ESOPHAGOGASTRODUODENOSCOPY);  Surgeon: Micheal Carmine, MD;  Location: [facility] ENDOSCOPY;  Service: Endoscopy;  Laterality: N/A;   EYE SURGERY     HPI:  Pt is a 58 y.o. male who had a upper endoscopy by Dr. Aundria at the beginning of this month.  The patient has a history of esophageal stenosis with dilation with a 54 French bougie and a small hiatal hernia at that time.  PMH includes Bipolar Dis, GERD and Esophagitis, depression, diabetes, Esophageal stenosis w/ Dilation.  He had a choking episode at work at [employer] today.  He was eating an orange and orange peel got stuck in his throat, he had difficulty breathing and a Heimlich maneuver was performed by coworkers after which he dislodged and orange peel and felt better.  He was noted to be hypoxemic and requiring oxygen.  He still has a vague discomfort in his chest.  He feels much better now.  Speaking full sentences and breathing more comfortably.     EGD performed this morning (06/30/2024) by GI revealed:  LA Grade D ( one or more mucosal breaks involving at least 75% of esophageal circumference) esophagitis with no bleeding was found in the lower third of the esophagus. Food residue was found in the entire examined stomach.    CT of Chest: LUNGS AND PLEURA:  Bilateral upper and lower lobes ground glass  opacities with sparing of the  peripheral lung fields.     Assessment / Plan / Recommendation  Clinical Impression    Pt seen for BSE this morning s/p EGD w/ GI. Pt awake, verbal and engaged appropriately w/ this SLP. Noted involuntary lingual movements including extension/rolling at rest and during during speech -- pt stated it is Baseline. This presentation had the appearance of Tardive Dyskinesia.  On RA; afebrile, WBC not elevated. Pt endorsed extra saliva in my mouth -- indicated its relationship to Esophageal phase issue including his dx'd Esophagitis.   OF NOTE: Pt strongly endorses s/s of REFLUX and Esophageal phase Dysmotility w/ solid foods at home; he has been following w/ GI(note OP chart notes earlier this month; and EGD in 07/2023).  Pt wears an upper Denture plate but has no bottom plate. He is working on [employer] implants. He has been educated by his GI on the need to chew his foods well b/f swallowing.    Pt appears to present w/ functional oropharyngeal phase swallowing w/ No overt oropharyngeal phase dysphagia appreciated during oral intake of trials; No neuromuscular swallowing deficits appreciated. Pt appears at reduced risk for aspiration from an oropharyngeal phase standpoint following general aspiration precautions. HOWEVER, pt has a baseline presentation of GERD/REFLUX and events of Esophagitis w/ Esophageal phase Dysmotility w/ solid foods. He has been recommended to be on a PPI by his GI. ANY Esophageal phase Dysmotility or Regurgitation of REFLUX material can increase risk for aspiration  of the REFLUX material during Retrograde flow thus impact Voicing and Pulmonary status.  (Note pt's EGD results today, 06/30/2024, by GI)    Pt sat upright in bed w/ cue and consumed several trials of thin liquids Via Cup/Straw, purees, and broken down, soft-solid food trials w/ No overt clinical s/s of aspiration noted; clear vocal quality b/t trials, no decline in pulmonary status,  no cough, no decline in O2 sats(99%). Oral phase appeared [facility] for bolus management and timely A-P transfer/clearing of material. Mastication/mashing of soften solids appeared appropriate for the boluses and in setting of No lower Dentition. OM exam was [facility] for strength/ROM of lingual/labial movements. No unilateral weakness. Involuntary lingual movements (rolling/extension) noted at rest. Speech clear, intelligible(the involuntary movements did not appear to impact speech).    Recommend a more Mech Soft diet (well-moistened, cut-small foods) w/ thin liquids. General aspiration precautions. REFLUX precautions strongly recommended to lessen chance for Regurgitation. Small bites/sips slowly w/ rest breaks allowing for Esophageal clearing. Remain upright post meals for ~30-45 mins and HOB elevated at night when sleeping. Pills w/ water vs w/ a Puree. Noted MD has initiated a PPI for pt -- this was discussed w/ pt also.   Recommend pt f/u w/ GI post D/C for ongoing management of REFLUX and Esophagitis; tx as indicated. Discussion and handouts given on REFLUX, behaviors to manage REFLUX, and foods/diet. MD to reconsult ST services if any new needs while admitted. NSG updated. Pt appreciative of Education information. Diet ordered for pt s/p discussion of food options best for chewing. Denture care given.  SLP Visit Diagnosis: Dysphagia, unspecified (R13.10) (Esophageal phase Dysmotility -- Esophagitis per EGD; Reflux/GERD baseline)    Aspiration Risk   (reduced from an oropharyngeal phase standpoint)    Diet Recommendation   Thin;Dysphagia 3 (mechanical soft) (momistened foods) = a more Mech Soft diet (well-moistened, cut-small foods) w/ thin liquids. General aspiration precautions. REFLUX precautions strongly recommended to lessen chance for Regurgitation. Small bites/sips slowly w/ rest breaks allowing for Esophageal clearing. Remain upright post meals for ~30-45 mins and HOB elevated at night when sleeping.    Medication Administration: Whole meds with liquid (vs whole in Puree if need)    Other  Recommendations Recommended Consults: Consider GI evaluation;Consider esophageal assessment (ongoing management post D/C; Dental f/u) Oral Care Recommendations: Oral care BID;Patient independent with oral care     Assistance Recommended at Discharge  PRN  Functional Status Assessment Patient has had a recent decline in their functional status and demonstrates the ability to make significant improvements in function in a reasonable and predictable amount of time.  Frequency and Duration  (n/a)   (n/a)       Prognosis Prognosis for improved oropharyngeal function: Fair (-Good) Barriers to Reach Goals: Time post onset;Severity of deficits Barriers/Prognosis Comment: Esophageal phase Dysmotility -- Esophagitis per EGD; Reflux/GERD baseline; lacks lower dentition      Swallow Study   General Date of Onset: 06/30/24 HPI: Pt is a 58 y.o. male who had a upper endoscopy by Dr. Aundria at the beginning of this month.  The patient has a history of esophageal stenosis with dilation with a 54 French bougie and a small hiatal hernia at that time.  PMH includes Bipolar Dis, GERD and Esophagitis, depression, diabetes, Esophageal stenosis w/ Dilation.  He had a choking episode at work at [employer] today.  He was eating an orange and orange peel got stuck in his throat, he had difficulty breathing and a Heimlich maneuver was performed  by coworkers after which he dislodged and orange peel and felt better.  He was noted to be hypoxemic and requiring oxygen.  He still has a vague discomfort in his chest.     He feels much better now.  Speaking full sentences and breathing more comfortably.    EGD performed this morning (06/30/2024) by GI revealed:  LA Grade D ( one or more mucosal breaks involving at least 75% of esophageal circumference) esophagitis with no bleeding was found in the lower third of the esophagus. Food residue  was found in the entire examined stomach.   CT of Chest: LUNGS AND PLEURA:  Bilateral upper and lower lobes ground glass opacities with sparing of the  peripheral lung fields. Type of Study: Bedside Swallow Evaluation Previous Swallow Assessment: none Diet Prior to this Study: NPO (s/p EGD but w/ recommendation for po diet per GI) Temperature Spikes Noted: No (wbc 7.3) Respiratory Status: Room air History of Recent Intubation: Yes (for EGD) Total duration of intubation (days):  (hour) Date extubated: 06/30/24 (post procedure) Behavior/Cognition: Alert;Cooperative;Pleasant mood Oral Cavity Assessment: Excessive secretions (min) Oral Care Completed by SLP: Yes Oral Cavity - Dentition: Edentulous (later placed his Upper Denture plate (No lower dentition)) Vision: Functional for self-feeding Self-Feeding Abilities: Able to feed self Patient Positioning: Upright in bed (supported) Baseline Vocal Quality: Normal Volitional Cough: Strong Volitional Swallow: Able to elicit    Oral/Motor/Sensory Function Overall Oral Motor/Sensory Function: Within functional limits (lingual rolling/extension noted)   Ice Chips Ice chips: Within functional limits Presentation: Spoon (fed; 3 trials)   Thin Liquid Thin Liquid: Within functional limits Presentation: Cup;Self Fed;Straw (~10-12 ozs total) Other Comments: water, juice, chocolate milk    Nectar Thick Nectar Thick Liquid: Not tested   Honey Thick Honey Thick Liquid: Not tested   Puree Puree: Within functional limits Presentation: Self Fed;Spoon (8 ozs)   Solid     Solid: Within functional limits (moistened well to break down) Presentation: Self Fed;Spoon (6 trials)        Comer Portugal, MS, CCC-SLP Speech Language Pathologist Rehab Services; [facility] -  [phone] (ascom) Zayvier Caravello 06/30/2024,4:37 PM

## 2024-06-30 NOTE — Anesthesia Preprocedure Evaluation (Addendum)
 Anesthesia Evaluation  Patient identified by MRN, date of birth, ID band Patient awake    Reviewed: Allergy & Precautions, NPO status , Patient's Chart, lab work & pertinent test results  History of Anesthesia Complications Negative for: history of anesthetic complications  Airway Mallampati: III  TM Distance: <3 FB Neck ROM: full    Dental  (+) Poor Dentition, Missing, Chipped, Implants   Pulmonary pneumonia, unresolved   + rhonchi        Cardiovascular Exercise Tolerance: Good hypertension, Normal cardiovascular exam     Neuro/Psych  Headaches  negative psych ROS   GI/Hepatic Neg liver ROS,GERD  ,,  Endo/Other  diabetes    Renal/GU      Musculoskeletal   Abdominal   Peds  Hematology negative hematology ROS (+)   Anesthesia Other Findings Past Medical History: No date: Bipolar 1 disorder (HCC) No date: Depression No date: Diabetes mellitus without complication (HCC) 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 09/22/2016: Sepsis (HCC) 09/22/2016: Septicemia (HCC)  Past Surgical History: No date: EYE SURGERY  BMI    Body Mass Index: 31.98 kg/m      Reproductive/Obstetrics negative OB ROS                              Anesthesia Physical Anesthesia Plan  ASA: 3  Anesthesia Plan: General ETT   Post-op Pain Management:    Induction: Intravenous  PONV Risk Score and Plan: Ondansetron , Dexamethasone , Midazolam  and Treatment may vary due to age or medical condition  Airway Management Planned: Oral ETT  Additional Equipment:   Intra-op Plan:   Post-operative Plan: Extubation in OR  Informed Consent: I have reviewed the patients History and Physical, chart, labs and discussed the procedure including the risks, benefits and alternatives for the proposed anesthesia with the patient or authorized representative who has indicated his/her understanding and acceptance.     Dental  Advisory Given  Plan Discussed with: Anesthesiologist, CRNA and Surgeon  Anesthesia Plan Comments: (I spoke with Dr. Jinny concerning the patients aspiration event, that an anesthetic may compound whatever pulmonary insult the patient had with this. Dr. Jinny feels that we need to proceed with the procedure today to better evaluate the patient.   Patient consented for risks of anesthesia including but not limited to:  - adverse reactions to medications - damage to eyes, teeth, lips or other oral mucosa - nerve damage due to positioning  - sore throat or hoarseness - Damage to heart, brain, nerves, lungs, other parts of body or loss of life  Patient voiced understanding and assent.)         Anesthesia Quick Evaluation

## 2024-07-01 ENCOUNTER — Other Ambulatory Visit: Payer: Self-pay | Admitting: Internal Medicine

## 2024-07-01 ENCOUNTER — Ambulatory Visit: Admitting: Pain Medicine

## 2024-07-01 DIAGNOSIS — K209 Esophagitis, unspecified without bleeding: Secondary | ICD-10-CM | POA: Diagnosis present

## 2024-07-01 DIAGNOSIS — E119 Type 2 diabetes mellitus without complications: Secondary | ICD-10-CM

## 2024-07-01 DIAGNOSIS — J690 Pneumonitis due to inhalation of food and vomit: Secondary | ICD-10-CM | POA: Diagnosis not present

## 2024-07-01 LAB — CBC
HCT: 34.9 % — ABNORMAL LOW (ref 39.0–52.0)
Hemoglobin: 10.7 g/dL — ABNORMAL LOW (ref 13.0–17.0)
MCH: 25.9 pg — ABNORMAL LOW (ref 26.0–34.0)
MCHC: 30.7 g/dL (ref 30.0–36.0)
MCV: 84.5 fL (ref 80.0–100.0)
Platelets: 262 K/uL (ref 150–400)
RBC: 4.13 MIL/uL — ABNORMAL LOW (ref 4.22–5.81)
RDW: 15.6 % — ABNORMAL HIGH (ref 11.5–15.5)
WBC: 8.5 K/uL (ref 4.0–10.5)
nRBC: 0 % (ref 0.0–0.2)

## 2024-07-01 LAB — GLUCOSE, CAPILLARY
Glucose-Capillary: 125 mg/dL — ABNORMAL HIGH (ref 70–99)
Glucose-Capillary: 119 mg/dL — ABNORMAL HIGH (ref 70–99)

## 2024-07-01 LAB — BASIC METABOLIC PANEL WITH GFR
Anion gap: 12 (ref 5–15)
BUN: 26 mg/dL — ABNORMAL HIGH (ref 6–20)
CO2: 25 mmol/L (ref 22–32)
Calcium: 8.3 mg/dL — ABNORMAL LOW (ref 8.9–10.3)
Chloride: 102 mmol/L (ref 98–111)
Creatinine, Ser: 0.87 mg/dL (ref 0.61–1.24)
GFR, Estimated: >60 mL/min (ref >60)
Glucose, Bld: 121 mg/dL — ABNORMAL HIGH (ref 70–99)
Potassium: 3.8 mmol/L (ref 3.5–5.1)
Sodium: 139 mmol/L (ref 135–145)

## 2024-07-01 MED ORDER — PANTOPRAZOLE SODIUM 40 MG PO TBEC: 40.0000 mg | DELAYED_RELEASE_TABLET | Freq: Two times a day (BID) | ORAL | 0 refills | Status: AC

## 2024-07-01 MED ORDER — AMOXICILLIN-POT CLAVULANATE 875-125 MG PO TABS: 1.0000 | ORAL_TABLET | Freq: Two times a day (BID) | ORAL | 0 refills | Status: AC

## 2024-07-01 NOTE — Discharge Summary (Signed)
 Physician Discharge Summary   Patient: Micheal Gay MRN: [id number] DOB: 04-19-1966  Admit date:     06/30/2024  Discharge date: 07/01/24  Discharge Physician: Shawnell Dykes   PCP: Devine, Jaimie, NP   Recommendations at discharge:   Complete antibiotic therapy as recommended Take oral PPI Follow-up with GI as an outpatient  Discharge Diagnoses: Principal Problem:   Aspiration pneumonitis (HCC) Active Problems:   Acute hypoxic respiratory failure (HCC)   Esophageal dysphagia   Depression   Type 2 diabetes mellitus without complication, without long-term current use of insulin (HCC)   Bipolar I disorder (HCC)   Choking due to food (regurgitated), initial encounter   Esophagitis  Resolved Problems:   * No resolved hospital problems. *  Hospital Course:  Micheal Gay is a 58 y.o. male with medical history significant for diabetes mellitus, bipolar disorder, depression, esophageal stenosis s/p recent dilatation, history of dysphagia who presents to the emergency room via EMS after he had a witnessed choking episode while at work.  He was eating an orange and states that it got stuck in his throat and he developed difficulty breathing.  He his coworkers attempted Heimlich maneuver with dislodgment of a piece of the fruit.  EMS was called due to vague chest discomfort and he was noted to be hypoxemic requiring oxygen supplementation at 2 L to maintain pulse oximetry greater than 92%. Upon arrival to the ER he was noted to be speaking in full sentences and appeared comfortable.  Room air pulse oximetry was 80% requiring oxygen supplementation.  Imaging was concerning for aspiration pneumonia and so admission was requested for acute hypoxic respiratory failure secondary to aspiration pneumonia. He denies having any fever, no cough, no headache, no blurred vision, no dizziness, no lightheadedness, no abdominal pain, no changes in his bowel habits, no urinary symptoms, no focal  deficit Abnormal labs include glucose of 166 CT scan of the chest without contrast shows bilateral upper and lower lobe ground-glass opacities with peripheral sparing. Differential considerations include inflammatory and infectious etiologies hypersensitivity pneumonitis, atypical infection, as well as aspiration pneumonitis. 5 mm right lower lobe lung nodule In a patient who was at low risk, no further follow-up is indicated. If the patient is at increased risk a follow-up CT of the chest without contrast material in 12 months may be considered. 4.1 cm ascending thoracic aorta. Recommend annual imaging followup by CTA or MRA. This recommendation follows 2010 Patient received a dose of Unasyn and will be admitted to the hospital for further evaluation.        Assessment and Plan:  * Aspiration pneumonitis (HCC) Secondary to choking episode related to known esophageal dysphagia Patient received IV Unasyn and will be switched to Augmentin  on discharge   Acute hypoxic respiratory failure (HCC) Secondary to aspiration pneumonia Resolved Patient had room air pulse oximetry in the 80's requiring oxygen supplementation at 2 L to maintain pulse oximetry greater than 92% He has been weaned off oxygen and at rest his pulse oximetry is in the high 90s Patient was ambulated in the hall and post ambulatory pulse ox was 99%    Esophageal dysphagia Patient has a known history of esophageal stenosis and dysphagia and is status post recent dilatation. Presents to the ER for evaluation after a choking episode at work Appreciate GI input.  Patient had an upper endoscopy which revealed esophagitis Appreciate speech therapy input, recommended a dysphagia 3 diet with thin liquids which patient will be discharged home on Will discharge  on oral PPI Follow-up with GI as an outpatient    Depression Continue Lexapro     Type 2 diabetes mellitus without complication, without long-term current use of insulin  (HCC) Continue oral hypoglycemic agents   Choking due to food (regurgitated), initial encounter Treatment as outlined in 3   Bipolar I disorder (HCC) Continue Depakote, Ingrezza and Klonopin             Consultants: Gastroenterology Procedures performed: Upper endoscopy Disposition: Home Diet recommendation:  Discharge Diet Orders (From admission, onward)     Start     Ordered   07/01/24 0000  Diet - low sodium heart healthy        07/01/24 1053           Carb modified diet DISCHARGE MEDICATION: Allergies as of 07/01/2024       Reactions   Other    Animal Dandruff Hay    Pollen Extract         Medication List     STOP taking these medications    indomethacin 50 MG capsule Commonly known as: INDOCIN       TAKE these medications    amitriptyline  10 MG tablet Commonly known as: ELAVIL  TAKE 1 TABLET BY MOUTH AT BEDTIME   amoxicillin -clavulanate 875-125 MG tablet Commonly known as: AUGMENTIN  Take 1 tablet by mouth 2 (two) times daily for 6 days.   atorvastatin  20 MG tablet Commonly known as: LIPITOR Take 1 tablet by mouth once daily   clonazePAM 0.5 MG tablet Commonly known as: KLONOPIN Take 0.5 mg by mouth 3 (three) times daily as needed.   divalproex 125 MG DR tablet Commonly known as: DEPAKOTE Take 125 mg by mouth at bedtime.   escitalopram  5 MG tablet Commonly known as: LEXAPRO  Take 1 tablet by mouth once daily   gabapentin 400 MG capsule Commonly known as: NEURONTIN Take 400 mg by mouth 2 (two) times daily.   Ingrezza 60 MG capsule Generic drug: valbenazine Take 60 mg by mouth daily. Is supposed to increase to 70mg  every day next week   Jardiance  25 MG Tabs tablet Generic drug: empagliflozin  TAKE 1 TABLET BY MOUTH ONCE DAILY BEFORE BREAKFAST   meloxicam 15 MG tablet Commonly known as: MOBIC Take 15 mg by mouth daily.   metFORMIN 1000 MG tablet Commonly known as: GLUCOPHAGE Take 1 tablet by mouth twice daily    Mounjaro 2.5 MG/0.5ML Pen Generic drug: tirzepatide Inject 2.5 mg into the skin once a week.   nortriptyline 10 MG capsule Commonly known as: PAMELOR Take by mouth.   Nurtec 75 MG Tbdp Generic drug: Rimegepant Sulfate Take 1 tablet (75 mg total) by mouth daily as needed (headache).   nystatin  100000 UNIT/ML suspension Commonly known as: MYCOSTATIN  Take 5 mLs (500,000 Units total) by mouth 4 (four) times daily.   ondansetron  4 MG disintegrating tablet Commonly known as: ZOFRAN -ODT DISSOLVE 1 TABLET IN MOUTH EVERY 8 HOURS AS NEEDED FOR NAUSEA FOR VOMITING   pantoprazole  40 MG tablet Commonly known as: PROTONIX  Take 1 tablet (40 mg total) by mouth 2 (two) times daily. What changed: when to take this   pioglitazone  45 MG tablet Commonly known as: ACTOS  TAKE 1 TABLET BY MOUTH IN THE MORNING   sildenafil 100 MG tablet Commonly known as: VIAGRA TAKE ONE-HALF TO ONE TABLET BY MOUTH 30 MINUTES TO 4 HOURS BEFORE INTERCOURSE AS DIRECTED   tamsulosin  0.4 MG Caps capsule Commonly known as: FLOMAX  TAKE 1 CAPSULE BY MOUTH ONCE DAILY AFTER BREAKFAST  Zavzpret  10 MG/ACT Soln Generic drug: Zavegepant HCl Place 1 spray into the nose daily.        Discharge Exam: Filed Weights   06/30/24 0258  Weight: 104 kg   Vitals and nursing note reviewed.  Constitutional:      Appearance: Normal appearance.  HENT:     Head: Normocephalic and atraumatic.     Nose: Nose normal.     Mouth/Throat:     Mouth: Mucous membranes are moist.  Eyes:     Conjunctiva/sclera: Conjunctivae normal.  Cardiovascular:     Rate and Rhythm: Normal rate and regular rhythm.  Pulmonary:     Effort: Pulmonary effort is normal.     Breath sounds: Rhonchi present.     Comments: Scattered rhonchi Abdominal:     General: Abdomen is flat. Bowel sounds are normal.     Palpations: Abdomen is soft.  Musculoskeletal:        General: Normal range of motion.     Cervical back: Normal range of motion and neck  supple.  Skin:    General: Skin is warm and dry.  Neurological:     General: No focal deficit present.     Mental Status: He is alert and oriented to person, place, and time.  Psychiatric:        Mood and Affect: Mood normal.        Behavior: Behavior normal.     Condition at discharge: stable  The results of significant diagnostics from this hospitalization (including imaging, microbiology, ancillary and laboratory) are listed below for reference.   Imaging Studies: CT Chest Wo Contrast Result Date: 06/30/2024 EXAM: CT CHEST WITHOUT CONTRAST 06/30/2024 04:41:58 AM TECHNIQUE: CT of the chest was performed without the administration of intravenous contrast. Multiplanar reformatted images are provided for review. Automated exposure control, iterative reconstruction, and/or weight based adjustment of the mA/kV was utilized to reduce the radiation dose to as low as reasonably achievable. COMPARISON: None available. CLINICAL HISTORY: Choking aspiration foreign body? Past medical history of bipolar, depression, diabetes, esophageal stenosis, presents with choking episode from work at [employer] today. He was eating an orange and orange peel got stuck in his throat, he had difficulty breathing and a Heimlich maneuver were performed by coworkers after which he dislodged an orange peel and felt better. He was noted to be hypoxemic and requiring oxygen. He still has a vague discomfort in his chest. FINDINGS: MEDIASTINUM: Heart and pericardium are unremarkable. The trachea appears patent and is midline. The central airways are clear. Normal appearance of the esophagus. The ascending thoracic aorta measures 4.1 cm. LYMPH NODES: No mediastinal, hilar or axillary lymphadenopathy. LUNGS AND PLEURA: Bilateral upper and lower lobes ground glass opacities with sparing of the peripheral lung fields. A nodule within the posterolateral right lower lobe measures 5 mm (image 106/4). No pleural effusion, consolidative  change, or pneumothorax. SOFT TISSUES/BONES: No acute abnormality of the bones or soft tissues. UPPER ABDOMEN: Parapelvic cyst versus hydronephrosis noted within the upper pole of the left kidney, incompletely characterized due to limited imaging through the upper abdomen. IMPRESSION: 1. Bilateral upper and lower lobe ground-glass opacities with peripheral sparing. Differential considerations include inflammatory and infectious etiologies hypersensitivity pneumonitis, atypical infection, as well as aspiration pneumonitis. 2. 5 mm right lower lobe lung nodule In a patient who was at low risk, no further follow-up is indicated. If the patient is at increased risk a follow-up CT of the chest without contrast material in 12 months may be considered 3.  4.1 cm ascending thoracic aorta. Recommend annual imaging followup by CTA or MRA. This recommendation follows 2010 ACCF/AHA/AATS/ACR/ASA/SCA/SCAI/SIR/STS/SVM Guidelines for the Diagnosis and Management of Patients with Thoracic Aortic Disease. Circulation. 2010; 121: Z733-z630. Aortic aneurysm NOS (ICD10-I71.9) Electronically signed by: Waddell Calk MD 06/30/2024 05:17 AM EDT RP Workstation: [id number]   DG Chest 1 View Result Date: 06/30/2024 CLINICAL DATA:  Recent choking episode with subsequent Heimlich maneuver EXAM: PORTABLE CHEST 1 VIEW COMPARISON:  12/21/2022 FINDINGS: Cardiac shadow is within normal limits. Lungs are well aerated bilaterally. Mild vascular congestion is noted with mild edematous changes. No focal confluent infiltrate or effusion is seen. No bony abnormality is noted. IMPRESSION: Mild changes of parenchymal edema and vascular congestion. Electronically Signed   By: Oneil Devonshire M.D.   On: 06/30/2024 03:48   US  Abdomen Complete Result Date: 06/08/2024 CLINICAL DATA:  elevated liver enzymes EXAM: ABDOMEN ULTRASOUND COMPLETE COMPARISON:  None Available. FINDINGS: Gallbladder: No gallstones. No wall thickening or pericholecystic fluid. No  sonographic Murphy's sign noted by sonographer. Common bile duct: Diameter: 4 mm Liver: Normal echogenicity. No focal lesion identified. No intrahepatic biliary ductal dilation. Portal vein is patent on color Doppler imaging with normal direction of blood flow towards the liver. IVC: No abnormality visualized. Pancreas: Visualized portion unremarkable. Spleen: Suboptimally evaluated due to overlying bowel gas and rib shadow. Right Kidney: Length: 12.9 cm. Normal echogenicity. No mass. No hydronephrosis or nephrolithiasis. Left Kidney: Length: 12.7 cm. Normal echogenicity. No mass. Moderate to severe hydronephrosis. Abdominal aorta: No aneurysm visualized. Other findings: Trabeculation of the bladder wall with layering echogenic debris. A large bladder diverticulum is noted. Abnormal postvoid residual of 222 mL. IMPRESSION: 1. Moderate to severe left-sided hydronephrosis. 2. Abnormal postvoid residual of 222 mL. Bladder wall thickening with trabeculation of the bladder wall, either due to chronic bladder outlet obstruction or neurogenic bladder. Correlation with urinalysis recommended to exclude acute cystitis. These results will be called to the ordering clinician or representative by the Radiologist Assistant and communication documented in the PACS or [employer]. Electronically Signed   By: Rogelia Myers M.D.   On: 06/08/2024 11:18    Microbiology: Results for orders placed or performed during the hospital encounter of 06/06/18  Chlamydia/NGC rt PCR ([facility] only)     Status: None   Collection Time: 06/06/18  6:47 PM   Specimen: Urine, Clean Catch  Result Value Ref Range Status   Specimen source GC/Chlam URINE, RANDOM  Final   Chlamydia Tr NOT DETECTED NOT DETECTED Final   N gonorrhoeae NOT DETECTED NOT DETECTED Final    Comment: (NOTE) This CT/NG assay has not been evaluated in patients with a history of  hysterectomy. Performed at [facility], [address] [place], [place]  [address]     Labs: CBC: Recent Labs  Lab 06/30/24 0311 07/01/24 0518  WBC 7.3 8.5  NEUTROABS 5.5  --   HGB 12.3* 10.7*  HCT 38.2* 34.9*  MCV 82.5 84.5  PLT 277 262   Basic Metabolic Panel: Recent Labs  Lab 06/30/24 0311 07/01/24 0518  NA 137 139  K 3.7 3.8  CL 98 102  CO2 27 25  GLUCOSE 166* 121*  BUN 36* 26*  CREATININE 1.20 0.87  CALCIUM  9.3 8.3*   Liver Function Tests: No results for input(s): AST, ALT, ALKPHOS, BILITOT, PROT, ALBUMIN in the last 168 hours. CBG: Recent Labs  Lab 06/30/24 1153 06/30/24 1956 06/30/24 2354 07/01/24 0456 07/01/24 0745  GLUCAP 170* 159* 187* 125* 119*  Discharge time spent: greater than 30 minutes.  Signed: Aimee Somerset, MD [facility] 07/01/2024

## 2024-07-01 NOTE — Plan of Care (Signed)
   Problem: Education: Goal: Knowledge of General Education information will improve Description: Including pain rating scale, medication(s)/side effects and non-pharmacologic comfort measures Outcome: Progressing   Problem: Clinical Measurements: Goal: Will remain free from infection Outcome: Progressing Goal: Diagnostic test results will improve Outcome: Progressing

## 2024-07-04 ENCOUNTER — Other Ambulatory Visit: Payer: Self-pay | Admitting: Internal Medicine

## 2024-07-04 DIAGNOSIS — F419 Anxiety disorder, unspecified: Secondary | ICD-10-CM

## 2024-07-04 DIAGNOSIS — N401 Enlarged prostate with lower urinary tract symptoms: Secondary | ICD-10-CM

## 2024-07-07 ENCOUNTER — Other Ambulatory Visit: Payer: Self-pay | Admitting: Internal Medicine

## 2024-07-07 DIAGNOSIS — N401 Enlarged prostate with lower urinary tract symptoms: Secondary | ICD-10-CM

## 2024-07-08 DIAGNOSIS — K1379 Other lesions of oral mucosa: Secondary | ICD-10-CM | POA: Insufficient documentation

## 2024-07-08 DIAGNOSIS — R911 Solitary pulmonary nodule: Secondary | ICD-10-CM | POA: Insufficient documentation

## 2024-07-08 DIAGNOSIS — Z8709 Personal history of other diseases of the respiratory system: Secondary | ICD-10-CM | POA: Insufficient documentation

## 2024-07-12 DIAGNOSIS — G2401 Drug induced subacute dyskinesia: Secondary | ICD-10-CM | POA: Insufficient documentation

## 2024-07-15 ENCOUNTER — Ambulatory Visit
Admission: RE | Admit: 2024-07-15 | Discharge: 2024-07-15 | Disposition: A | Discharge status: Home / Self Care | Source: Ambulatory Visit | Attending: Pain Medicine | Admitting: Pain Medicine

## 2024-07-15 ENCOUNTER — Ambulatory Visit (HOSPITAL_BASED_OUTPATIENT_CLINIC_OR_DEPARTMENT_OTHER): Admitting: Pain Medicine

## 2024-07-15 ENCOUNTER — Encounter: Payer: Self-pay | Admitting: Pain Medicine

## 2024-07-15 VITALS — BP 142/98 | HR 80 | Temp 97.3°F | Resp 18 | Ht 71.0 in | Wt 225.0 lb

## 2024-07-15 DIAGNOSIS — M542 Cervicalgia: Secondary | ICD-10-CM

## 2024-07-15 DIAGNOSIS — G8929 Other chronic pain: Secondary | ICD-10-CM | POA: Insufficient documentation

## 2024-07-15 DIAGNOSIS — G4486 Cervicogenic headache: Secondary | ICD-10-CM

## 2024-07-15 DIAGNOSIS — G8918 Other acute postprocedural pain: Secondary | ICD-10-CM | POA: Diagnosis present

## 2024-07-15 DIAGNOSIS — M47812 Spondylosis without myelopathy or radiculopathy, cervical region: Secondary | ICD-10-CM | POA: Insufficient documentation

## 2024-07-15 DIAGNOSIS — M4310 Spondylolisthesis, site unspecified: Secondary | ICD-10-CM | POA: Diagnosis not present

## 2024-07-15 DIAGNOSIS — M5030 Other cervical disc degeneration, unspecified cervical region: Secondary | ICD-10-CM | POA: Insufficient documentation

## 2024-07-15 MED ORDER — HYDROCODONE-ACETAMINOPHEN 5-325 MG PO TABS: 1.0000 | ORAL_TABLET | Freq: Four times a day (QID) | ORAL | 0 refills | Status: AC | PRN

## 2024-07-15 MED ORDER — ROPIVACAINE HCL 2 MG/ML IJ SOLN
9.0000 mL | Freq: Once | INTRAMUSCULAR | Status: AC
  Administered 2024-07-15: 9 mL via PERINEURAL

## 2024-07-15 MED ORDER — MIDAZOLAM HCL 5 MG/5ML IJ SOLN
0.5000 mg | Freq: Once | INTRAMUSCULAR | Status: AC
  Administered 2024-07-15 (×2): 1 mg via INTRAVENOUS

## 2024-07-15 MED ORDER — DEXAMETHASONE SOD PHOSPHATE PF 10 MG/ML IJ SOLN
10.0000 mg | Freq: Once | INTRAMUSCULAR | Status: AC
  Administered 2024-07-15: 10 mg

## 2024-07-15 MED ORDER — LIDOCAINE HCL 2 % IJ SOLN
INTRAMUSCULAR | Status: AC
  Filled 2024-07-15: qty 20

## 2024-07-15 MED ORDER — ROPIVACAINE HCL 2 MG/ML IJ SOLN
INTRAMUSCULAR | Status: AC
  Filled 2024-07-15: qty 20

## 2024-07-15 MED ORDER — FENTANYL CITRATE (PF) 100 MCG/2ML IJ SOLN
INTRAMUSCULAR | Status: AC
  Filled 2024-07-15: qty 2

## 2024-07-15 MED ORDER — FENTANYL CITRATE (PF) 100 MCG/2ML IJ SOLN
25.0000 ug | INTRAMUSCULAR | Status: DC | PRN
  Administered 2024-07-15: 25 ug via INTRAVENOUS

## 2024-07-15 MED ORDER — LIDOCAINE HCL 2 % IJ SOLN
20.0000 mL | Freq: Once | INTRAMUSCULAR | Status: AC
  Administered 2024-07-15: 400 mg

## 2024-07-15 MED ORDER — PENTAFLUOROPROP-TETRAFLUOROETH EX AERO
INHALATION_SPRAY | Freq: Once | CUTANEOUS | Status: AC
  Administered 2024-07-15: 30 via TOPICAL

## 2024-07-15 MED ORDER — MIDAZOLAM HCL 5 MG/5ML IJ SOLN
INTRAMUSCULAR | Status: AC
Start: 2024-07-15 — End: 2024-07-15
  Filled 2024-07-15: qty 5

## 2024-07-15 NOTE — Progress Notes (Signed)
 PROVIDER NOTE: Interpretation of information contained herein should be left to medically-trained personnel. Specific patient instructions are provided elsewhere under Patient Instructions section of medical record. This document was created in part using STT-dictation technology, any transcriptional errors that may result from this process are unintentional.  Patient: Micheal Gay Type: Established DOB: 11-Jan-1966 MRN: [id number] PCP: Devine, Jaimie, NP  Service: Procedure DOS: 07/15/2024 Setting: Ambulatory Location: Ambulatory outpatient facility Delivery: Face-to-face Provider: [name], MD Specialty: Interventional Pain Management Specialty designation: 09 Location: Outpatient facility Ref. Prov.: Devine, Jaimie, NP       Interventional Therapy   Procedure: Cervical Facet, Medial Branch Radiofrequency Ablation (RFA)  #1  Laterality: Right (-RT)  Level: TON, C3, C4, C5, and C6 Medial Branch Level(s). Lesioning of these levels should completely denervate the C3-4, C4-5, and C5-6 cervical facet joints.  Imaging: Fluoroscopy-guided Spinal ([id number]) Anesthesia: Local anesthesia (1-2% Lidocaine ) Anxiolysis: IV Versed  2.0 mg Sedation: Minimal Sedation Fentanyl  0.5 mL (25 mcg) DOS: 07/15/2024  Performed by: [name], MD  Purpose: Therapeutic/Palliative Indications: Chronic neck pain (cervicalgia) severe enough to impact quality of life or function. Rationale (medical necessity): procedure needed and proper for the diagnosis and/or treatment of Micheal Gay medical symptoms and needs. Indications: 1. Cervicalgia   2. Cervicogenic headache (2ry area of Pain)   3. Cervical facet joint pain   4. Cervical facet hypertrophy (Multilevel) (Bilateral)   5. Cervical facet syndrome   6. Cervical facet joint arthropathy   7. Cervical Grade 1 Retrolisthesis of C3/C4 (Stable)   8. Chronic neck pain (1ry area of Pain) (Midline) (Bilateral)   9. Painful cervical range  of motion   10. DDD (degenerative disc disease), cervical   11. Spondylosis without myelopathy or radiculopathy, cervical region    Micheal Gay has been dealing with the above chronic pain for longer than three months and has either failed to respond, was unable to tolerate, or simply did not get enough benefit from other more conservative therapies including, but not limited to: 1. Over-the-counter medications 2. Anti-inflammatory medications 3. Muscle relaxants 4. Membrane stabilizers 5. Opioids 6. Physical therapy and/or chiropractic manipulation 7. Modalities (Heat, ice, etc.) 8. Invasive techniques such as nerve blocks. Micheal Gay has attained more than 50% relief of the pain from a series of diagnostic injections conducted in separate occasions.  Pain Score: Pre-procedure: 7 /10 Post-procedure: 0-No pain/10    Target: Cervical medial nerve  Location: Posterolateral aspect of the waist of the cervical articular pillar. Region: Cervical  Approach: Paramedial  Type of procedure: Radiofrequency Neurolytic Ablation   Position / Prep / Materials:  Position: Prone with head of the table was raised to facilitate breathing.  Prep solution: ChloraPrep (2% chlorhexidine gluconate and 70% isopropyl alcohol) Prep Area: Entire Posterior Cervical Region  Materials:  Tray:  RFA (Radiofrequency) tray Needle(s):  Type: RFA (Teflon-coated radiofrequency ablation needles)  Gauge (G): 22  Length: Regular (10cm)  Qty: 5     H&P (Pre-op Assessment):  Micheal Gay is a 58 y.o. (year old), male patient, seen today for interventional treatment. He  has a past surgical history that includes Eye surgery and Esophagogastroduodenoscopy (N/A, 06/30/2024). Micheal Gay has a current medication list which includes the following prescription(s): amitriptyline , atorvastatin , clonazepam, divalproex, escitalopram , gabapentin, hydrocodone-acetaminophen , [START ON 07/22/2024] hydrocodone-acetaminophen ,  jardiance , meloxicam, metformin, mounjaro, nortriptyline, nystatin , ondansetron , pantoprazole , pioglitazone , nurtec, sildenafil, tamsulosin , tramadol, valbenazine, and zavzpret , and the following Facility-Administered Medications: fentanyl . His primarily concern today is the Neck Pain (right)  Initial Vital Signs:  Pulse/HCG Rate: 80ECG Heart Rate: 69 Temp: 97.6 F (36.4 C) Resp: 15 BP: 137/79 SpO2: 100 %  BMI: Estimated body mass index is 31.38 kg/m as calculated from the following:   Height as of this encounter: 5' 11 (1.803 m).   Weight as of this encounter: 225 lb (102.1 kg).  Risk Assessment: Allergies: Reviewed. He is allergic to other and pollen extract.  Allergy Precautions: None required Coagulopathies: Reviewed. None identified.  Blood-thinner therapy: None at this time Active Infection(s): Reviewed. None identified. Micheal Gay is afebrile  Site Confirmation: Micheal Gay was asked to confirm the procedure and laterality before marking the site Procedure checklist: Completed Consent: Before the procedure and under the influence of no sedative(s), amnesic(s), or anxiolytics, the patient was informed of the treatment options, risks and possible complications. To fulfill our ethical and legal obligations, as recommended by the American Medical Association's Code of Ethics, I have informed the patient of my clinical impression; the nature and purpose of the treatment or procedure; the risks, benefits, and possible complications of the intervention; the alternatives, including doing nothing; the risk(s) and benefit(s) of the alternative treatment(s) or procedure(s); and the risk(s) and benefit(s) of doing nothing. The patient was provided information about the general risks and possible complications associated with the procedure. These may include, but are not limited to: failure to achieve desired goals, infection, bleeding, organ or nerve damage, allergic reactions, paralysis, and  death. In addition, the patient was informed of those risks and complications associated to Spine-related procedures, such as failure to decrease pain; infection (i.e.: Meningitis, epidural or intraspinal abscess); bleeding (i.e.: epidural hematoma, subarachnoid hemorrhage, or any other type of intraspinal or peri-dural bleeding); organ or nerve damage (i.e.: Any type of peripheral nerve, nerve root, or spinal cord injury) with subsequent damage to sensory, motor, and/or autonomic systems, resulting in permanent pain, numbness, and/or weakness of one or several areas of the body; allergic reactions; (i.e.: anaphylactic reaction); and/or death. Furthermore, the patient was informed of those risks and complications associated with the medications. These include, but are not limited to: allergic reactions (i.e.: anaphylactic or anaphylactoid reaction(s)); adrenal axis suppression; blood sugar elevation that in diabetics may result in ketoacidosis or comma; water retention that in patients with history of congestive heart failure may result in shortness of breath, pulmonary edema, and decompensation with resultant heart failure; weight gain; swelling or edema; medication-induced neural toxicity; particulate matter embolism and blood vessel occlusion with resultant organ, and/or nervous system infarction; and/or aseptic necrosis of one or more joints. Finally, the patient was informed that Medicine is not an exact science; therefore, there is also the possibility of unforeseen or unpredictable risks and/or possible complications that may result in a catastrophic outcome. The patient indicated having understood very clearly. We have given the patient no guarantees and we have made no promises. Enough time was given to the patient to ask questions, all of which were answered to the patient's satisfaction. Mr. Calixto has indicated that he wanted to continue with the procedure. Attestation: I, the ordering provider,  attest that I have discussed with the patient the benefits, risks, side-effects, alternatives, likelihood of achieving goals, and potential problems during recovery for the procedure that I have provided informed consent. Date  Time: 07/15/2024  9:20 AM  Pre-Procedure Preparation:  Monitoring: As per clinic protocol. Respiration, ETCO2, SpO2, BP, heart rate and rhythm monitor placed and checked for adequate function Safety Precautions: Patient was assessed for positional comfort and pressure points before starting the  procedure. Time-out: I initiated and conducted the Time-out before starting the procedure, as per protocol. The patient was asked to participate by confirming the accuracy of the Time Out information. Verification of the correct person, site, and procedure were performed and confirmed by me, the nursing staff, and the patient. Time-out conducted as per Joint Commission's Universal Protocol (UP.01.01.01). Time: 1055 Start Time: 1055 hrs.  Description/Narrative of Procedure:          Start Time: 1055 hrs. Rationale (medical necessity): procedure needed and proper for the diagnosis and/or treatment of the patient's medical symptoms and needs. Procedural Technique Safety Precautions: Aspiration looking for blood return was conducted prior to all injections. At no point did we inject any substances, as a needle was being advanced. No attempts were made at seeking any paresthesias. Safe injection practices and needle disposal techniques used. Medications properly checked for expiration dates. SDV (single dose vial) medications used. Description of the Procedure: Protocol guidelines were followed. The patient was assisted into a comfortable position. The target area was identified and the area prepped in the usual manner. Skin & deeper tissues infiltrated with local anesthetic. Appropriate amount of time allowed to pass for local anesthetics to take effect. The procedure needles were then  advanced to the target area. Proper needle placement secured. Negative aspiration confirmed. Solution injected in intermittent fashion, asking for systemic symptoms every 0.5cc of injectate. The needles were then removed and the area cleansed, making sure to leave some of the prepping solution back to take advantage of its long term bactericidal properties.  Technical description of procedure:  Laterality: See above. Radiofrequency Ablation (RFA) C3 Medial Branch Nerve RFA: The target area for the C3 dorsal medial articular branch is the lateral concave waist of the articular pillar of C3. Under fluoroscopic guidance, a Radiofrequency needle was inserted until contact was made with os over the postero-lateral aspect of the articular pillar of C3 (target area). Sensory and motor testing was conducted to properly adjust the position of the needle. Once satisfactory placement of the needle was achieved, the numbing solution was slowly injected after negative aspiration for blood. 2.0 mL of the nerve block solution was injected without difficulty or complication. After waiting for at least 3 minutes, the ablation was performed. Once completed, the needle was removed intact. C4 Medial Branch Nerve RFA: The target area for the C4 dorsal medial articular branch is the lateral concave waist of the articular pillar of C4. Under fluoroscopic guidance, a Radiofrequency needle was inserted until contact was made with os over the postero-lateral aspect of the articular pillar of C4 (target area). Sensory and motor testing was conducted to properly adjust the position of the needle. Once satisfactory placement of the needle was achieved, the numbing solution was slowly injected after negative aspiration for blood. 2.0 mL of the nerve block solution was injected without difficulty or complication. After waiting for at least 3 minutes, the ablation was performed. Once completed, the needle was removed intact. C5 Medial  Branch Nerve RFA: The target area for the C5 dorsal medial articular branch is the lateral concave waist of the articular pillar of C5. Under fluoroscopic guidance, a Radiofrequency needle was inserted until contact was made with os over the postero-lateral aspect of the articular pillar of C5 (target area). Sensory and motor testing was conducted to properly adjust the position of the needle. Once satisfactory placement of the needle was achieved, the numbing solution was slowly injected after negative aspiration for blood. 2.0 mL of  the nerve block solution was injected without difficulty or complication. After waiting for at least 3 minutes, the ablation was performed. Once completed, the needle was removed intact. C6 Medial Branch Nerve RFA: The target area for the C6 dorsal medial articular branch is the lateral concave waist of the articular pillar of C6. Under fluoroscopic guidance, a Radiofrequency needle was inserted until contact was made with os over the postero-lateral aspect of the articular pillar of C6 (target area). Sensory and motor testing was conducted to properly adjust the position of the needle. Once satisfactory placement of the needle was achieved, the numbing solution was slowly injected after negative aspiration for blood. 2.0 mL of the nerve block solution was injected without difficulty or complication. After waiting for at least 3 minutes, the ablation was performed. Once completed, the needle was removed intact. C7 Medial Branch Nerve RFA: The target for the C7 dorsal medial articular branch lies on the superior-medial tip of the C7 transverse process. Under fluoroscopic guidance, a Radiofrequency needle was inserted until contact was made with os over the postero-lateral aspect of the articular pillar of C7 (target area). Sensory and motor testing was conducted to properly adjust the position of the needle. Once satisfactory placement of the needle was achieved, the numbing solution  was slowly injected after negative aspiration for blood. 2.0 mL of the nerve block solution was injected without difficulty or complication. After waiting for at least 3 minutes, the ablation was performed. Once completed, the needle was removed intact. Radiofrequency lesioning (ablation):  Radiofrequency Generator: Medtronic AccurianTM AG 1000 RF Generator Sensory Stimulation Parameters: 50 Hz was used to locate & identify the nerve, making sure that the needle was positioned such that there was no sensory stimulation below 0.3 V or above 0.7 V. Motor Stimulation Parameters: 2 Hz was used to evaluate the motor component. Care was taken not to lesion any nerves that demonstrated motor stimulation of the lower extremities at an output of less than 2.5 times that of the sensory threshold, or a maximum of 2.0 V. Lesioning Technique Parameters: Standard Radiofrequency settings. (Not bipolar or pulsed.) Temperature Settings: 80 degrees C Lesioning time: 60 seconds Stationary intra-operative compliance: Compliant   Once the entire procedure was completed, the treated area was cleaned, making sure to leave some of the prepping solution back to take advantage of its long term bactericidal properties. Stationary intra-operative compliance: Compliant  Anatomy Reference Guide:         Facet Joint Innervation  C1-2 Third occipital Nerve (TON)  C2-3 TON, C3  Medial Branch  C3-4 C3, C4                     C4-5 C4, C5                     C5-6 C5, C6                     C6-7 C6, C7                     C7-T1 C7, C8                      Cervical Facet Pain Pattern overlap:   Vitals:   07/15/24 1120 07/15/24 1125 07/15/24 1127 07/15/24 1136  BP: 139/86 130/85  132/86  Pulse:      Resp: 17 13 12 10   Temp:    98.4 F (36.9  C)  TempSrc:    Temporal  SpO2: 100% 100% 100% 99%  Weight:      Height:         End Time: 1127 hrs.  Imaging Guidance (Spinal):         Type of Imaging  Technique: Fluoroscopy Guidance (Spinal) Indication(s): Fluoroscopy guidance for needle placement to enhance accuracy in procedures requiring precise needle localization for targeted delivery of medication in or near specific anatomical locations not easily accessible without such real-time imaging assistance. Exposure Time: Please see nurses notes. Contrast: None used. Fluoroscopic Guidance: I was personally present during the use of fluoroscopy. Tunnel Vision Technique used to obtain the best possible view of the target area. Parallax error corrected before commencing the procedure. Direction-depth-direction technique used to introduce the needle under continuous pulsed fluoroscopy. Once target was reached, antero-posterior, oblique, and lateral fluoroscopic projection used confirm needle placement in all planes. Images permanently stored in EMR. Interpretation: No contrast injected. I personally interpreted the imaging intraoperatively. Adequate needle placement confirmed in multiple planes. Permanent images saved into the patient's record.  Post-operative Assessment:  Post-procedure Vital Signs:  Pulse/HCG Rate: 8071 Temp: 98.4 F (36.9 C) Resp: 10 BP: 132/86 SpO2: 99 %  EBL: None  Complications: No immediate post-treatment complications observed by team, or reported by patient.  Note: The patient tolerated the entire procedure well. A repeat set of vitals were taken after the procedure and the patient was kept under observation following institutional policy, for this type of procedure. Post-procedural neurological assessment was performed, showing return to baseline, prior to discharge. The patient was provided with post-procedure discharge instructions, including a section on how to identify potential problems. Should any problems arise concerning this procedure, the patient was given instructions to immediately contact us , at any time, without hesitation. In any case, we plan to  contact the patient by telephone for a follow-up status report regarding this interventional procedure.  Comments:  No additional relevant information.  Plan of Care (POC)  Orders:  Orders Placed This Encounter  Procedures   Radiofrequency,Cervical    Scheduling Instructions:     Side(s): Right-sided     Level(s): C3, C4, C5, C6, and C7 Medial Branch Nerves     Sedation: With Sedation.     Date: 07/15/2024    Where will this procedure be performed?:   [facility]   Radiofrequency,Cervical    Standing Status:   Future    Expiration Date:   11/12/2024    Scheduling Instructions:     Side(s): Left-sided     Level(s): C3, C4, C5, C6, and C7 Medial Branch Nerves     Sedation: With Sedation.     Timeframe: 2 weeks from now.    Where will this procedure be performed?:   [facility]   DG [facility] C-ARM 1-60 MIN NO REPORT    Intraoperative interpretation by procedural physician at [facility].    Standing Status:   Standing    Number of Occurrences:   1    Reason for exam::   Assistance in needle guidance and placement for procedures requiring needle placement in or near specific anatomical locations not easily accessible without such assistance.   Informed Consent Details: Physician/Practitioner Attestation; Transcribe to consent form and obtain patient signature    Nursing Order: Transcribe to consent form and obtain patient signature. Note: Always confirm laterality of pain with Mr. Genova, before procedure.    Physician/Practitioner attestation of informed consent for procedure/surgical case:  I, the physician/practitioner, attest that I have discussed with the patient the benefits, risks, side effects, alternatives, likelihood of achieving goals and potential problems during recovery for the procedure that I have provided informed consent.    Procedure:   Cervical Facet Radiofrequency Ablation    Physician/Practitioner performing the procedure:    Marcell Chavarin A. Tanya, MD    Indication/Reason:   Neck Pain (Cervicalgia), with our without referred arm pain, due to Facet Joint Arthralgia (Joint Pain) known as Cervical Facet Syndrome, secondary to Cervical, and/or Cervico-thoracic Spondylosis (Arthritis of the Spine), without myelopathy or radiculop   Provide equipment / supplies at bedside    Procedure tray: Radiofrequency Tray Additional material: Large hemostat (x1); Small hemostat (x1); Towels (x8); 4x4 sterile sponge pack (x1) Needle type: Teflon-coated Radiofrequency Needle (Disposable  single use) Size: Regular Quantity: 5    Standing Status:   Standing    Number of Occurrences:   1    Specify:   Radiofrequency Tray   Saline lock IV    Have LR [phone] mL available and administer at 125 mL/hr if patient becomes hypotensive.    Standing Status:   Standing    Number of Occurrences:   1     Analgesic: No chronic opioid analgesics therapy prescribed by our practice. None. MME/day: 0 mg/day    Medications ordered for procedure: Meds ordered this encounter  Medications   lidocaine  (XYLOCAINE ) 2 % (with pres) injection 400 mg   pentafluoroprop-tetrafluoroeth (GEBAUERS) aerosol   midazolam  (VERSED ) 5 MG/5ML injection 0.5-2 mg    Make sure Flumazenil is available in the pyxis when using this medication. If oversedation occurs, administer 0.2 mg IV over 15 sec. If after 45 sec no response, administer 0.2 mg again over 1 min; may repeat at 1 min intervals; not to exceed 4 doses (1 mg)   fentaNYL  (SUBLIMAZE ) injection 25-50 mcg    Make sure Narcan is available in the pyxis when using this medication. In the event of respiratory depression (RR< 8/min): Titrate NARCAN (naloxone) in increments of 0.1 to 0.2 mg IV at 2-3 minute intervals, until desired degree of reversal.   ropivacaine  (PF) 2 mg/mL (0.2%) (NAROPIN ) injection 9 mL   dexamethasone  (DECADRON ) injection 10 mg   HYDROcodone-acetaminophen  (NORCO/VICODIN) 5-325 MG tablet     Sig: Take 1 tablet by mouth every 6 (six) hours as needed for up to 7 days for severe pain (pain score 7-10). Must last 7 days.    Dispense:  28 tablet    Refill:  0    For acute post-operative pain. Not to be refilled. Must last 7 days.   HYDROcodone-acetaminophen  (NORCO/VICODIN) 5-325 MG tablet    Sig: Take 1 tablet by mouth every 6 (six) hours as needed for up to 7 days for severe pain (pain score 7-10). Must last 7 days.    Dispense:  28 tablet    Refill:  0    For acute post-operative pain. Not to be refilled.  Must last 7 days.   Medications administered: We administered lidocaine , pentafluoroprop-tetrafluoroeth, midazolam , fentaNYL , ropivacaine  (PF) 2 mg/mL (0.2%), and dexamethasone .  See the medical record for exact dosing, route, and time of administration.    Interventional Therapies  Risk Factors  Considerations  Medical Comorbidities:  BPH  HTN  GERD  Hx. BNZ use  T2NIDDM  Bipolar     Planned  Pending:   Diagnostic right cervical facet RFA #1    Under consideration:   Diagnostic bilateral cervical facet RFA #1 (starting  with the right side and following up 2 weeks later with the left side.)   Completed:   Diagnostic bilateral cervical facet MBB x2 (05/25/2024) (100/100/100 x1 week/75)  Diagnostic midline cervical ESI x2 (03/18/2024) (1st:100/100/80 x 1 week/0) (2nd:100/100/85/80)  Referral to physical therapy entered (10/29/2023)  Missed appointments: (11/11/2023-procedure, 02/04/2024-eval, 03/11/2024-procedure) (12/09/2023-virtual)   Therapeutic  Palliative (PRN) options:   None established   Completed by other providers:   None reported       Follow-up plan:   Return in about 2 weeks (around 07/29/2024) for (ECT):(L) C-FCT RFA #1, (PPE).     Recent Visits Date Type Provider Dept  06/09/24 Office Visit Tanya Glisson, MD [facility]  05/25/24 Procedure visit Tanya Glisson, MD [facility]  04/28/24 Office Visit Tanya Glisson, MD [facility]  Showing recent visits within past 90 days and meeting all other requirements Today's Visits Date Type Provider Dept  07/15/24 Procedure visit Tanya Glisson, MD [facility]  Showing today's visits and meeting all other requirements Future Appointments No visits were found meeting these conditions. Showing future appointments within next 90 days and meeting all other requirements   Disposition: Discharge home  Discharge (Date  Time): 07/15/2024;   hrs.   Primary Care Physician: Devine, Jaimie, NP Location: [facility] Note by: Glisson DELENA Tanya, MD (TTS technology used. I apologize for any typographical errors that were not detected and corrected.) Date: 07/15/2024; Time: 11:43 AM  Disclaimer:  Medicine is not an visual merchandiser. The only guarantee in medicine is that nothing is guaranteed. It is important to note that the decision to proceed with this intervention was based on the information collected from the patient. The Data and conclusions were drawn from the patient's questionnaire, the interview, and the physical examination. Because the information was provided in large part by the patient, it cannot be guaranteed that it has not been purposely or unconsciously manipulated. Every effort has been made to obtain as much relevant data as possible for this evaluation. It is important to note that the conclusions that lead to this procedure are derived in large part from the available data. Always take into account that the treatment will also be dependent on availability of resources and existing treatment guidelines, considered by other Pain Management Practitioners as being common knowledge and practice, at the time of the intervention. For Medico-Legal purposes, it is also important to point out that variation in procedural techniques and pharmacological choices are the acceptable norm. The indications,  contraindications, technique, and results of the above procedure should only be interpreted and judged by a Board-Certified Interventional Pain Specialist with extensive familiarity and expertise in the same exact procedure and technique.

## 2024-07-15 NOTE — Patient Instructions (Signed)
 ______________________________________________________________________    Post-Radiofrequency (RF) Discharge Instructions  You have just completed a Radiofrequency Neurotomy.  The following instructions will provide you with information and guidelines for self-care upon discharge.  If at any time you have questions or concerns please call your physician. DO NOT DRIVE YOURSELF!!  Instructions: Apply ice: Fill a plastic sandwich bag with crushed ice. Cover it with a small towel and apply to injection site. Apply for 15 minutes then remove x 15 minutes. Repeat sequence on day of procedure, until you go to bed. The purpose is to minimize swelling and discomfort after procedure. Apply heat: Apply heat to procedure site starting the day following the procedure. The purpose is to treat any soreness and discomfort from the procedure. Food intake: No eating limitations, unless stipulated above.  Nevertheless, if you have had sedation, you may experience some nausea.  In this case, it may be wise to wait at least two hours prior to resuming regular diet. Physical activities: Keep activities to a minimum for the first 8 hours after the procedure. For the first 24 hours after the procedure, do not drive a motor vehicle,  Operate heavy machinery, power tools, or handle any weapons.  Consider walking with the use of an assistive device or accompanied by an adult for the first 24 hours.  Do not drink alcoholic beverages including beer.  Do not make any important decisions or sign any legal documents. Go home and rest today.  Resume activities tomorrow, as tolerated.  Use caution in moving about as you may experience mild leg weakness.  Use caution in cooking, use of household electrical appliances and climbing steps. Driving: If you have received any sedation, you are not allowed to drive for 24 hours after your procedure. Blood thinner: Restart your blood thinner 6 hours after your procedure. (Only for those taking  blood thinners) Insulin : As soon as you can eat, you may resume your normal dosing schedule. (Only for those taking insulin ) Medications: May resume pre-procedure medications.  Do not take any drugs, other than what has been prescribed to you. Infection prevention: Keep procedure site clean and dry. Post-procedure Pain Diary: Extremely important that this be done correctly and accurately. Recorded information will be used to determine the next step in treatment. Pain evaluated is that of treated area only. Do not include pain from an untreated area. Complete every hour, on the hour, for the initial 8 hours. Set an alarm to help you do this part accurately. Do not go to sleep and have it completed later. It will not be accurate. Follow-up appointment: Keep your follow-up appointment after the procedure. Usually 2-6 weeks after radiofrequency. Bring you pain diary. The information collected will be essential for your long-term care.   Expect: From numbing medicine (AKA: Local Anesthetics): Numbness or decrease in pain. Onset: Full effect within 15 minutes of injected. Duration: It will depend on the type of local anesthetic used. On the average, 1 to 8 hours.  From steroids (when added): Decrease in swelling or inflammation. Once inflammation is improved, relief of the pain will follow. Onset of benefits: Depends on the amount of swelling present. The more swelling, the longer it will take for the benefits to be seen. In some cases, up to 10 days. Duration: Steroids will stay in the system x 2 weeks. Duration of benefits will depend on multiple posibilities including persistent irritating factors. From procedure: Some discomfort is to be expected once the numbing medicine wears off. In the case of  radiofrequency procedures, this may last as long as 6-8 weeks. Additional post-procedure pain medication is provided for this. Discomfort is minimized if ice and heat are applied as instructed.  Call  if: You experience numbness and weakness that gets worse with time, as opposed to wearing off. He experience any unusual bleeding, difficulty breathing, or loss of the ability to control your bowel and bladder. (This applies to Spinal procedures only) You experience any redness, swelling, heat, red streaks, elevated temperature, fever, or any other signs of a possible infection.  Emergency Numbers: Durning business hours (Monday - Thursday, 8:00 AM - 4:00 PM) (Friday, 9:00 AM - 12:00 Noon): [phone] After hours: [phone] ______________________________________________________________________    ______________________________________________________________________    Steroid injections  Common steroids for injections Triamcinolone : Used by many sports medicine physicians for large joint and bursal injections, often combined with a local anesthetic like lidocaine . A study focusing on coccydynia (tailbone pain) found triamcinolone  was more effective than betamethasone, suggesting it may also be preferable for other localized inflammation conditions. Methylprednisolone : A common alternative to triamcinolone  that is also a strong anti-inflammatory. It is available in different formulations, with the acetate suspension being the long-acting option for intra-articular injections. Dexamethasone: This is a non-particulate steroid, meaning it has a lower risk of tissue damage compared to particulate steroids like triamcinolone  and methylprednisolone . While less common for this specific use, it is an option for targeted injections.   Considerations for physicians Particulate vs. non-particulate steroids: Triamcinolone  and methylprednisolone  are particulate, meaning they can clump together. Dexamethasone is non-particulate. Particulate steroids are often preferred for their longer-lasting effects but carry a theoretical higher risk for certain injections (though this is less of a concern in the  costochondral joints). Combined injectate: Corticosteroids are typically mixed with a local anesthetic like lidocaine  to provide both immediate pain relief (from the anesthetic) and longer-term inflammation reduction (from the steroid). Imaging guidance: To ensure accurate placement of the needle and medication, physicians may use ultrasound or fluoroscopic guidance for the injection, especially in complex or refractory cases.   Patient guidance Before undergoing a steroid injection, discuss the options with your physician. They will determine the best steroid, dosage, and procedure for your specific case based on factors like: Severity of your condition History of response to other treatments Your overall health status Experience and preference of the physician  Last  Updated: 05/04/2024 ______________________________________________________________________     ______________________________________________________________________    Procedure instructions  Stop blood-thinners  Do not eat or drink fluids (other than water ) for 6 hours before your procedure  No water  for 2 hours before your procedure  Take your blood pressure medicine with a sip of water   Arrive 30 minutes before your appointment  If sedation is planned, bring suitable driver. [place], [place], & public transportation are NOT APPROVED)  Carefully read the Preparing for your procedure detailed instructions  If you have questions call us  at [phone]  Procedure appointments are for procedures only.   NO medication refills or new problem evaluations will be done on procedure days.   Only the scheduled, pre-approved procedure and side will be done.   ______________________________________________________________________     ______________________________________________________________________    Preparing for your procedure  Appointments: If you think you may not be able to keep your appointment, call  24-48 hours in advance to cancel. We need time to make it available to others.  Procedure visits are for procedures only. During your procedure appointment there will be: NO Prescription Refills*. NO medication  changes or discussions*. NO discussion of disability issues*. NO unrelated pain problem evaluations*. NO evaluations to order other pain procedures*. *These will be addressed at a separate and distinct evaluation encounter on the provider's evaluation schedule and not during procedure days.  Instructions: Food intake: Avoid eating anything solid for at least 8 hours prior to your procedure. Clear liquid intake: You may take clear liquids such as water  up to 2 hours prior to your procedure. (No carbonated drinks. No soda.) Transportation: Unless otherwise stated by your physician, bring a driver. (Driver cannot be a Market researcher, Pharmacist, community, or any other form of public transportation.) Morning Medicines: Except for blood thinners, take all of your other morning medications with a sip of water . Make sure to take your heart and blood pressure medicines. If your blood pressure's lower number is above 100, the case will be rescheduled. Blood thinners: Make sure to stop your blood thinners as instructed.  If you take a blood thinner, but were not instructed to stop it, call our office [phone] and ask to talk to a nurse. Not stopping a blood thinner prior to certain procedures could lead to serious complications. Diabetics on insulin : Notify the staff so that you can be scheduled 1st case in the morning. If your diabetes requires high dose insulin , take only  of your normal insulin  dose the morning of the procedure and notify the staff that you have done so. Preventing infections: Shower with an antibacterial soap the morning of your procedure.  Build-up your immune system: Take 1000 mg of Vitamin C with every meal (3 times a day) the day prior to your procedure. Antibiotics: Inform the nursing staff if  you are taking any antibiotics or if you have any conditions that may require antibiotics prior to procedures. (Example: recent joint implants)   Pregnancy: If you are pregnant make sure to notify the nursing staff. Not doing so may result in injury to the fetus, including death.  Sickness: If you have a cold, fever, or any active infections, call and cancel or reschedule your procedure. Receiving steroids while having an infection may result in complications. Arrival: You must be in the facility at least 30 minutes prior to your scheduled procedure. Tardiness: Your scheduled time is also the cutoff time. If you do not arrive at least 15 minutes prior to your procedure, you will be rescheduled.  Children: Do not bring any children with you. Make arrangements to keep them home. Dress appropriately: There is always a possibility that your clothing may get soiled. Avoid long dresses. Valuables: Do not bring any jewelry or valuables.  Reasons to call and reschedule or cancel your procedure: (Following these recommendations will minimize the risk of a serious complication.) Surgeries: Avoid having procedures within 2 weeks of any surgery. (Avoid for 2 weeks before or after any surgery). Flu Shots: Avoid having procedures within 2 weeks of a flu shots or . (Avoid for 2 weeks before or after immunizations). Barium: Avoid having a procedure within 7-10 days after having had a radiological study involving the use of radiological contrast. (Myelograms, Barium swallow or enema study). Heart attacks: Avoid any elective procedures or surgeries for the initial 6 months after a Myocardial Infarction (Heart Attack). Blood thinners: It is imperative that you stop these medications before procedures. Let us  know if you if you take any blood thinner.  Infection: Avoid procedures during or within two weeks of an infection (including chest colds or gastrointestinal problems). Symptoms associated with infections include:  Localized  redness, fever, chills, night sweats or profuse sweating, burning sensation when voiding, cough, congestion, stuffiness, runny nose, sore throat, diarrhea, nausea, vomiting, cold or Flu symptoms, recent or current infections. It is specially important if the infection is over the area that we intend to treat. Heart and lung problems: Symptoms that may suggest an active cardiopulmonary problem include: cough, chest pain, breathing difficulties or shortness of breath, dizziness, ankle swelling, uncontrolled high or unusually low blood pressure, and/or palpitations. If you are experiencing any of these symptoms, cancel your procedure and contact your primary care physician for an evaluation.  Remember:  Regular Business hours are:  Monday to Thursday 8:00 AM to 4:00 PM  Provider's Schedule: Eric Como, MD:  Procedure days: Tuesday and Thursday 7:30 AM to 4:00 PM  Wallie Sherry, MD:  Procedure days: Monday and Wednesday 7:30 AM to 4:00 PM Last  Updated: 08/19/2023 ______________________________________________________________________     ______________________________________________________________________    General Risks and Possible Complications  Patient Responsibilities: It is important that you read this as it is part of your informed consent. It is our duty to inform you of the risks and possible complications associated with treatments offered to you. It is your responsibility as a patient to read this and to ask questions about anything that is not clear or that you believe was not covered in this document.  Patient's Rights: You have the right to refuse treatment. You also have the right to change your mind, even after initially having agreed to have the treatment done. However, under this last option, if you wait until the last second to change your mind, you may be charged for the materials used up to that point.  Introduction: Medicine is not an Visual merchandiser.  Everything in Medicine, including the lack of treatment(s), carries the potential for danger, harm, or loss (which is by definition: Risk). In Medicine, a complication is a secondary problem, condition, or disease that can aggravate an already existing one. All treatments carry the risk of possible complications. The fact that a side effects or complications occurs, does not imply that the treatment was conducted incorrectly. It must be clearly understood that these can happen even when everything is done following the highest safety standards.  No treatment: You can choose not to proceed with the proposed treatment alternative. The "PRO(s)" would include: avoiding the risk of complications associated with the therapy. The "CON(s)" would include: not getting any of the treatment benefits. These benefits fall under one of three categories: diagnostic; therapeutic; and/or palliative. Diagnostic benefits include: getting information which can ultimately lead to improvement of the disease or symptom(s). Therapeutic benefits are those associated with the successful treatment of the disease. Finally, palliative benefits are those related to the decrease of the primary symptoms, without necessarily curing the condition (example: decreasing the pain from a flare-up of a chronic condition, such as incurable terminal cancer).  General Risks and Complications: These are associated to most interventional treatments. They can occur alone, or in combination. They fall under one of the following six (6) categories: no benefit or worsening of symptoms; bleeding; infection; nerve damage; allergic reactions; and/or death. No benefits or worsening of symptoms: In Medicine there are no guarantees, only probabilities. No healthcare provider can ever guarantee that a medical treatment will work, they can only state the probability that it may. Furthermore, there is always the possibility that the condition may worsen, either  directly, or indirectly, as a consequence of the treatment. Bleeding: This is more common  if the patient is taking a blood thinner, either prescription or over the counter (example: Goody Powders, Fish oil, Aspirin, Garlic, etc.), or if suffering a condition associated with impaired coagulation (example: Hemophilia, cirrhosis of the liver, low platelet counts, etc.). However, even if you do not have one on these, it can still happen. If you have any of these conditions, or take one of these drugs, make sure to notify your treating physician. Infection: This is more common in patients with a compromised immune system, either due to disease (example: diabetes, cancer, human immunodeficiency virus [HIV], etc.), or due to medications or treatments (example: therapies used to treat cancer and rheumatological diseases). However, even if you do not have one on these, it can still happen. If you have any of these conditions, or take one of these drugs, make sure to notify your treating physician. Nerve Damage: This is more common when the treatment is an invasive one, but it can also happen with the use of medications, such as those used in the treatment of cancer. The damage can occur to small secondary nerves, or to large primary ones, such as those in the spinal cord and brain. This damage may be temporary or permanent and it may lead to impairments that can range from temporary numbness to permanent paralysis and/or brain death. Allergic Reactions: Any time a substance or material comes in contact with our body, there is the possibility of an allergic reaction. These can range from a mild skin rash (contact dermatitis) to a severe systemic reaction (anaphylactic reaction), which can result in death. Death: In general, any medical intervention can result in death, most of the time due to an unforeseen complication. ______________________________________________________________________

## 2024-07-15 NOTE — Progress Notes (Signed)
 Safety precautions to be maintained throughout the outpatient stay will include: orient to surroundings, keep bed in low position, maintain call bell within reach at all times, provide assistance with transfer out of bed and ambulation.

## 2024-07-16 ENCOUNTER — Telehealth: Payer: Self-pay | Admitting: *Deleted

## 2024-07-16 NOTE — Telephone Encounter (Signed)
 Attempted to call for post procedure follow-up. The number listed on the chart is not in service.

## 2024-08-07 ENCOUNTER — Other Ambulatory Visit: Payer: Self-pay | Admitting: Internal Medicine

## 2024-08-07 DIAGNOSIS — F32A Depression, unspecified: Secondary | ICD-10-CM

## 2024-08-13 ENCOUNTER — Other Ambulatory Visit: Payer: Self-pay | Admitting: Internal Medicine

## 2024-08-13 DIAGNOSIS — E119 Type 2 diabetes mellitus without complications: Secondary | ICD-10-CM

## 2024-08-30 ENCOUNTER — Other Ambulatory Visit: Payer: Self-pay

## 2024-08-30 DIAGNOSIS — F419 Anxiety disorder, unspecified: Secondary | ICD-10-CM

## 2024-08-31 MED ORDER — ESCITALOPRAM OXALATE 5 MG PO TABS: 5.0000 mg | ORAL_TABLET | Freq: Every day | ORAL | 0 refills | Status: AC

## 2024-10-06 ENCOUNTER — Other Ambulatory Visit: Payer: Self-pay | Admitting: Family

## 2024-10-06 DIAGNOSIS — R140 Abdominal distension (gaseous): Secondary | ICD-10-CM

## 2024-10-06 DIAGNOSIS — R1084 Generalized abdominal pain: Secondary | ICD-10-CM

## 2024-10-11 ENCOUNTER — Ambulatory Visit: Admission: RE | Admit: 2024-10-11 | Discharge: 2024-10-11 | Attending: Family | Admitting: Family

## 2024-10-11 DIAGNOSIS — R1084 Generalized abdominal pain: Secondary | ICD-10-CM

## 2024-10-11 DIAGNOSIS — R140 Abdominal distension (gaseous): Secondary | ICD-10-CM

## 2024-10-11 MED ORDER — IOHEXOL 300 MG/ML  SOLN
100.0000 mL | Freq: Once | INTRAMUSCULAR | Status: AC | PRN
  Administered 2024-10-11: 100 mL via INTRAVENOUS

## 2024-10-13 ENCOUNTER — Other Ambulatory Visit: Payer: Self-pay | Admitting: Family

## 2024-10-13 DIAGNOSIS — R140 Abdominal distension (gaseous): Secondary | ICD-10-CM

## 2024-10-13 DIAGNOSIS — R1084 Generalized abdominal pain: Secondary | ICD-10-CM

## 2024-10-14 ENCOUNTER — Ambulatory Visit
Admission: RE | Admit: 2024-10-14 | Discharge: 2024-10-14 | Disposition: A | Source: Ambulatory Visit | Attending: Family | Admitting: Family

## 2024-10-14 DIAGNOSIS — R140 Abdominal distension (gaseous): Secondary | ICD-10-CM

## 2024-10-14 DIAGNOSIS — R1084 Generalized abdominal pain: Secondary | ICD-10-CM

## 2024-10-19 ENCOUNTER — Ambulatory Visit (HOSPITAL_COMMUNITY)
